# Patient Record
Sex: Female | Born: 1970 | Race: White | Hispanic: Yes | Marital: Married | Smoking: Never smoker
Health system: Southern US, Community
[De-identification: ages and names within clinical notes are randomized; demographics above are authoritative.]

## PROBLEM LIST (undated history)

## (undated) DIAGNOSIS — Z8742 Personal history of other diseases of the female genital tract: Secondary | ICD-10-CM

## (undated) DIAGNOSIS — N840 Polyp of corpus uteri: Secondary | ICD-10-CM

## (undated) DIAGNOSIS — Z8679 Personal history of other diseases of the circulatory system: Secondary | ICD-10-CM

## (undated) DIAGNOSIS — Z9889 Other specified postprocedural states: Secondary | ICD-10-CM

## (undated) DIAGNOSIS — N809 Endometriosis, unspecified: Secondary | ICD-10-CM

## (undated) DIAGNOSIS — Z8673 Personal history of transient ischemic attack (TIA), and cerebral infarction without residual deficits: Secondary | ICD-10-CM

## (undated) DIAGNOSIS — D6859 Other primary thrombophilia: Secondary | ICD-10-CM

## (undated) DIAGNOSIS — R102 Pelvic and perineal pain: Principal | ICD-10-CM

## (undated) DIAGNOSIS — R51 Headache: Secondary | ICD-10-CM

## (undated) HISTORY — PX: OTHER SURGICAL HISTORY: SHX169

## (undated) HISTORY — DX: Other primary thrombophilia: D68.59

## (undated) HISTORY — DX: Headache: R51

---

## 1999-10-06 HISTORY — PX: LAPAROSCOPIC OVARIAN CYSTECTOMY: SUR786

## 2004-08-25 ENCOUNTER — Emergency Department (HOSPITAL_COMMUNITY): Admission: EM | Admit: 2004-08-25 | Discharge: 2004-08-25 | Payer: Self-pay | Admitting: Emergency Medicine

## 2004-10-14 ENCOUNTER — Encounter (INDEPENDENT_AMBULATORY_CARE_PROVIDER_SITE_OTHER): Payer: Self-pay | Admitting: Specialist

## 2004-10-14 ENCOUNTER — Ambulatory Visit: Payer: Self-pay | Admitting: Obstetrics & Gynecology

## 2004-11-05 ENCOUNTER — Ambulatory Visit (HOSPITAL_COMMUNITY): Admission: RE | Admit: 2004-11-05 | Discharge: 2004-11-05 | Payer: Self-pay | Admitting: Obstetrics and Gynecology

## 2004-11-28 ENCOUNTER — Ambulatory Visit: Payer: Self-pay | Admitting: Internal Medicine

## 2004-12-25 ENCOUNTER — Encounter (INDEPENDENT_AMBULATORY_CARE_PROVIDER_SITE_OTHER): Payer: Self-pay | Admitting: *Deleted

## 2004-12-25 ENCOUNTER — Other Ambulatory Visit: Admission: RE | Admit: 2004-12-25 | Discharge: 2004-12-25 | Payer: Self-pay | Admitting: Obstetrics and Gynecology

## 2005-01-08 ENCOUNTER — Ambulatory Visit: Payer: Self-pay | Admitting: Family Medicine

## 2005-07-20 ENCOUNTER — Ambulatory Visit: Payer: Self-pay | Admitting: Endocrinology

## 2005-07-30 ENCOUNTER — Ambulatory Visit: Payer: Self-pay | Admitting: Internal Medicine

## 2005-07-30 ENCOUNTER — Ambulatory Visit: Payer: Self-pay | Admitting: Obstetrics and Gynecology

## 2005-07-30 ENCOUNTER — Encounter: Payer: Self-pay | Admitting: Obstetrics and Gynecology

## 2005-08-14 ENCOUNTER — Ambulatory Visit: Payer: Self-pay

## 2005-09-01 ENCOUNTER — Ambulatory Visit: Payer: Self-pay | Admitting: Internal Medicine

## 2005-09-02 ENCOUNTER — Ambulatory Visit: Payer: Self-pay | Admitting: Internal Medicine

## 2006-03-07 ENCOUNTER — Inpatient Hospital Stay (HOSPITAL_COMMUNITY): Admission: AD | Admit: 2006-03-07 | Discharge: 2006-03-07 | Payer: Self-pay | Admitting: Obstetrics and Gynecology

## 2006-07-05 ENCOUNTER — Inpatient Hospital Stay (HOSPITAL_COMMUNITY): Admission: AD | Admit: 2006-07-05 | Discharge: 2006-07-05 | Payer: Self-pay | Admitting: Obstetrics and Gynecology

## 2006-07-07 ENCOUNTER — Inpatient Hospital Stay (HOSPITAL_COMMUNITY): Admission: AD | Admit: 2006-07-07 | Discharge: 2006-07-09 | Payer: Self-pay | Admitting: Obstetrics and Gynecology

## 2006-09-15 ENCOUNTER — Ambulatory Visit: Payer: Self-pay | Admitting: Internal Medicine

## 2006-09-15 LAB — CONVERTED CEMR LAB
AST: 19 units/L (ref 0–37)
Albumin: 4.2 g/dL (ref 3.5–5.2)
Alkaline Phosphatase: 68 units/L (ref 39–117)
Bacteria, U Microscopic: NEGATIVE /hpf
Basophils Absolute: 0 10*3/uL (ref 0.0–0.1)
CO2: 30 meq/L (ref 19–32)
Chol/HDL Ratio, serum: 3
Cholesterol: 176 mg/dL (ref 0–200)
Creatinine, Ser: 0.7 mg/dL (ref 0.4–1.2)
Crystals: NEGATIVE
GFR calc non Af Amer: 102 mL/min
HCT: 40.6 % (ref 36.0–46.0)
MCHC: 32.6 g/dL (ref 30.0–36.0)
Monocytes Relative: 8 % (ref 3.0–11.0)
Mucus, UA: NEGATIVE
Neutro Abs: 2.1 10*3/uL (ref 1.4–7.7)
Potassium: 4.3 meq/L (ref 3.5–5.1)
RBC: 5.01 M/uL (ref 3.87–5.11)
RDW: 14 % (ref 11.5–14.6)
Sodium: 142 meq/L (ref 135–145)
Specific Gravity, Urine: 1.02 (ref 1.000–1.03)
Total Bilirubin: 0.7 mg/dL (ref 0.3–1.2)
Total Protein: 7.3 g/dL (ref 6.0–8.3)
Urine Glucose: NEGATIVE mg/dL
Urobilinogen, UA: 0.2 (ref 0.0–1.0)

## 2006-09-22 ENCOUNTER — Ambulatory Visit: Payer: Self-pay | Admitting: Internal Medicine

## 2006-10-18 ENCOUNTER — Encounter: Admission: RE | Admit: 2006-10-18 | Discharge: 2006-10-18 | Payer: Self-pay | Admitting: Internal Medicine

## 2006-10-18 ENCOUNTER — Ambulatory Visit: Payer: Self-pay | Admitting: Internal Medicine

## 2006-10-18 LAB — CONVERTED CEMR LAB
Vit D, 1,25-Dihydroxy: 38 (ref 20–57)
Vitamin B-12: 413 pg/mL (ref 211–911)

## 2006-11-16 ENCOUNTER — Ambulatory Visit (HOSPITAL_COMMUNITY): Admission: RE | Admit: 2006-11-16 | Discharge: 2006-11-17 | Payer: Self-pay | Admitting: Interventional Radiology

## 2006-11-24 ENCOUNTER — Ambulatory Visit: Payer: Self-pay | Admitting: Internal Medicine

## 2006-11-24 LAB — CONVERTED CEMR LAB
Basophils Relative: 1.1 % — ABNORMAL HIGH (ref 0.0–1.0)
Eosinophils Relative: 1.9 % (ref 0.0–5.0)
HCT: 34.6 % — ABNORMAL LOW (ref 36.0–46.0)
Hemoglobin: 11.4 g/dL — ABNORMAL LOW (ref 12.0–15.0)
Lymphocytes Relative: 39.5 % (ref 12.0–46.0)
Monocytes Absolute: 0.3 10*3/uL (ref 0.2–0.7)
Neutro Abs: 2.4 10*3/uL (ref 1.4–7.7)
Neutrophils Relative %: 50.8 % (ref 43.0–77.0)
Platelets: 288 10*3/uL (ref 150–400)
Transferrin: 272.4 mg/dL (ref >=212.0)
WBC: 4.8 10*3/uL (ref 4.5–10.5)

## 2006-11-29 ENCOUNTER — Ambulatory Visit: Payer: Self-pay | Admitting: Internal Medicine

## 2006-11-30 ENCOUNTER — Encounter: Payer: Self-pay | Admitting: Interventional Radiology

## 2007-01-09 ENCOUNTER — Ambulatory Visit (HOSPITAL_COMMUNITY): Admission: RE | Admit: 2007-01-09 | Discharge: 2007-01-09 | Payer: Self-pay | Admitting: Interventional Radiology

## 2007-02-24 ENCOUNTER — Ambulatory Visit: Payer: Self-pay | Admitting: Internal Medicine

## 2007-02-25 ENCOUNTER — Ambulatory Visit (HOSPITAL_COMMUNITY): Admission: RE | Admit: 2007-02-25 | Discharge: 2007-02-25 | Payer: Self-pay | Admitting: Interventional Radiology

## 2007-06-03 ENCOUNTER — Encounter: Payer: Self-pay | Admitting: Internal Medicine

## 2007-06-03 DIAGNOSIS — D689 Coagulation defect, unspecified: Secondary | ICD-10-CM

## 2007-06-03 DIAGNOSIS — I671 Cerebral aneurysm, nonruptured: Secondary | ICD-10-CM | POA: Insufficient documentation

## 2007-07-17 ENCOUNTER — Encounter: Admission: RE | Admit: 2007-07-17 | Discharge: 2007-07-17 | Payer: Self-pay | Admitting: Neurology

## 2007-08-15 ENCOUNTER — Encounter: Payer: Self-pay | Admitting: Internal Medicine

## 2007-08-21 ENCOUNTER — Encounter: Admission: RE | Admit: 2007-08-21 | Discharge: 2007-08-21 | Payer: Self-pay | Admitting: Neurology

## 2007-09-02 ENCOUNTER — Encounter: Admission: RE | Admit: 2007-09-02 | Discharge: 2007-09-02 | Payer: Self-pay | Admitting: Neurology

## 2007-10-10 ENCOUNTER — Encounter: Admission: RE | Admit: 2007-10-10 | Discharge: 2007-10-10 | Payer: Self-pay | Admitting: Neurology

## 2007-11-25 ENCOUNTER — Encounter: Admission: RE | Admit: 2007-11-25 | Discharge: 2007-11-25 | Payer: Self-pay | Admitting: Obstetrics and Gynecology

## 2008-01-02 ENCOUNTER — Ambulatory Visit: Payer: Self-pay | Admitting: Internal Medicine

## 2008-02-24 ENCOUNTER — Ambulatory Visit (HOSPITAL_COMMUNITY): Admission: RE | Admit: 2008-02-24 | Discharge: 2008-02-24 | Payer: Self-pay | Admitting: Interventional Radiology

## 2009-01-02 ENCOUNTER — Encounter: Admission: RE | Admit: 2009-01-02 | Discharge: 2009-01-02 | Payer: Self-pay | Admitting: Obstetrics and Gynecology

## 2009-03-08 ENCOUNTER — Ambulatory Visit: Payer: Self-pay | Admitting: Internal Medicine

## 2009-03-08 DIAGNOSIS — M545 Low back pain: Secondary | ICD-10-CM

## 2009-03-08 LAB — CONVERTED CEMR LAB
Specific Gravity, Urine: 1.01 (ref 1.000–1.030)
Urine Glucose: NEGATIVE mg/dL
Urobilinogen, UA: 0.2 (ref 0.0–1.0)

## 2009-03-18 ENCOUNTER — Telehealth: Payer: Self-pay | Admitting: Internal Medicine

## 2009-03-19 ENCOUNTER — Encounter: Payer: Self-pay | Admitting: Internal Medicine

## 2009-05-27 ENCOUNTER — Ambulatory Visit: Payer: Self-pay | Admitting: Internal Medicine

## 2009-05-27 LAB — CONVERTED CEMR LAB
Basophils Absolute: 0 10*3/uL (ref 0.0–0.1)
Bilirubin, Direct: 0 mg/dL (ref 0.0–0.3)
Cholesterol: 145 mg/dL (ref 0–200)
Eosinophils Absolute: 0.2 10*3/uL (ref 0.0–0.7)
GFR calc non Af Amer: 119.7 mL/min (ref >60)
Glucose, Bld: 91 mg/dL (ref 70–99)
HDL: 44.2 mg/dL (ref >39.00)
MCHC: 33.8 g/dL (ref 30.0–36.0)
MCV: 83 fL (ref 78.0–100.0)
Monocytes Absolute: 0.3 10*3/uL (ref 0.1–1.0)
Neutrophils Relative %: 42.8 % — ABNORMAL LOW (ref 43.0–77.0)
Nitrite: NEGATIVE
Platelets: 190 10*3/uL (ref 150.0–400.0)
Potassium: 4 meq/L (ref 3.5–5.1)
Sodium: 144 meq/L (ref 135–145)
Total Bilirubin: 0.6 mg/dL (ref 0.3–1.2)
Total Protein, Urine: NEGATIVE mg/dL
VLDL: 13.4 mg/dL (ref 0.0–40.0)
WBC: 3.4 10*3/uL — ABNORMAL LOW (ref 4.5–10.5)
pH: 5.5 (ref 5.0–8.0)

## 2009-05-30 LAB — CONVERTED CEMR LAB: Vit D, 25-Hydroxy: 25 ng/mL — ABNORMAL LOW (ref 30–89)

## 2009-06-03 ENCOUNTER — Ambulatory Visit: Payer: Self-pay | Admitting: Internal Medicine

## 2009-06-03 DIAGNOSIS — R5383 Other fatigue: Secondary | ICD-10-CM | POA: Insufficient documentation

## 2009-06-12 ENCOUNTER — Ambulatory Visit: Payer: Self-pay | Admitting: Internal Medicine

## 2009-06-14 ENCOUNTER — Telehealth: Payer: Self-pay | Admitting: Internal Medicine

## 2009-10-14 ENCOUNTER — Ambulatory Visit: Payer: Self-pay | Admitting: Internal Medicine

## 2009-10-25 ENCOUNTER — Ambulatory Visit (HOSPITAL_COMMUNITY): Admission: RE | Admit: 2009-10-25 | Discharge: 2009-10-25 | Payer: Self-pay | Admitting: Interventional Radiology

## 2009-12-03 ENCOUNTER — Encounter: Payer: Self-pay | Admitting: Internal Medicine

## 2010-04-02 ENCOUNTER — Ambulatory Visit: Payer: Self-pay | Admitting: Internal Medicine

## 2010-04-02 DIAGNOSIS — G243 Spasmodic torticollis: Secondary | ICD-10-CM | POA: Insufficient documentation

## 2010-04-02 DIAGNOSIS — M542 Cervicalgia: Secondary | ICD-10-CM | POA: Insufficient documentation

## 2010-04-09 ENCOUNTER — Telehealth: Payer: Self-pay | Admitting: Internal Medicine

## 2010-05-26 ENCOUNTER — Ambulatory Visit: Payer: Self-pay | Admitting: Internal Medicine

## 2010-05-26 LAB — CONVERTED CEMR LAB
ALT: 12 units/L (ref 0–35)
AST: 16 units/L (ref 0–37)
Alkaline Phosphatase: 41 units/L (ref 39–117)
Basophils Relative: 1.3 % (ref 0.0–3.0)
Bilirubin, Direct: 0 mg/dL (ref 0.0–0.3)
Chloride: 108 meq/L (ref 96–112)
Cholesterol: 149 mg/dL (ref 0–200)
Creatinine, Ser: 0.7 mg/dL (ref 0.4–1.2)
Eosinophils Relative: 3.1 % (ref 0.0–5.0)
GFR calc non Af Amer: 108.54 mL/min (ref >60)
LDL Cholesterol: 92 mg/dL (ref 0–99)
Leukocytes, UA: NEGATIVE
MCV: 81.9 fL (ref 78.0–100.0)
Monocytes Absolute: 0.3 10*3/uL (ref 0.1–1.0)
Monocytes Relative: 8.9 % (ref 3.0–12.0)
Neutrophils Relative %: 45 % (ref 43.0–77.0)
Nitrite: NEGATIVE
Potassium: 4.2 meq/L (ref 3.5–5.1)
RBC: 4.21 M/uL (ref 3.87–5.11)
Specific Gravity, Urine: >=1.03 (ref 1.000–1.030)
Total CHOL/HDL Ratio: 3
Total Protein: 6.5 g/dL (ref 6.0–8.3)
Urobilinogen, UA: 0.2 (ref 0.0–1.0)
VLDL: 7.8 mg/dL (ref 0.0–40.0)
WBC: 3.8 10*3/uL — ABNORMAL LOW (ref 4.5–10.5)

## 2010-06-04 ENCOUNTER — Ambulatory Visit: Payer: Self-pay | Admitting: Internal Medicine

## 2010-06-04 DIAGNOSIS — R51 Headache: Secondary | ICD-10-CM

## 2010-07-03 ENCOUNTER — Ambulatory Visit: Payer: Self-pay | Admitting: Internal Medicine

## 2010-07-03 DIAGNOSIS — J189 Pneumonia, unspecified organism: Secondary | ICD-10-CM | POA: Insufficient documentation

## 2010-08-06 ENCOUNTER — Ambulatory Visit: Payer: Self-pay | Admitting: Internal Medicine

## 2010-08-06 DIAGNOSIS — H1030 Unspecified acute conjunctivitis, unspecified eye: Secondary | ICD-10-CM

## 2010-10-25 ENCOUNTER — Encounter: Payer: Self-pay | Admitting: *Deleted

## 2010-10-26 ENCOUNTER — Encounter: Payer: Self-pay | Admitting: Obstetrics and Gynecology

## 2010-11-04 NOTE — Assessment & Plan Note (Signed)
Summary: cough,cold getting worse/cd   Vital Signs:  Patient profile:   40 year old female Menstrual status:  regular Height:      63 inches Weight:      130 pounds Temp:     99.4 degrees F oral Pulse rate:   88 / minute Pulse rhythm:   regular Resp:     16 per minute BP sitting:   104 / 60  (left arm) Cuff size:   regular  Vitals Entered By: Lanier Prude, CMA(AAMA) (July 03, 2010 8:09 AM) CC: cough, chest congestion X 1wk Is Patient Diabetic? No   Primary Care Tuyet Bader:  Georgina Quint Plotnikov MD  CC:  cough and chest congestion X 1wk.  History of Present Illness: The patient presents with complaints of sore throat, fever, cough, sinus congestion and drainge of seven days duration. Not better with OTC meds. Chest hurts with coughing. Can't sleep due to cough. Muscle aches are present.  The mucus is colored.   Current Medications (verified): 1)  Aspirin Ec 325 Mg  Tbec (Aspirin) .... Once Daily 2)  Vitamin D3 1000 Unit  Tabs (Cholecalciferol) .... 2 By Mouth Daily 3)  Fioricet 50-325-40 Mg Tabs (Butalbital-Apap-Caffeine) .[name].. 1-2 By Mouth Two Times A Day As Needed Migraine or Tension Ha 4)  Pennsaid 1.5 % Soln (Diclofenac Sodium) .... 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    Physical Exam  General:  Well-developed, well-nourished, in no acute distress; alert and oriented x 3.   Mouth:   Erythematous throat and intranasal mucosa c/w URI  Lungs:  R rhonchi Heart:  RRR Abdomen:  soft, non-tender, normal bowel sounds, no distention, no masses, no guarding, no rigidity, no rebound tenderness, no hepatomegaly, and no splenomegaly.   Msk:  normal ROM, no joint tenderness, no joint swelling, no joint warmth, no redness over joints, no joint deformities, no joint instability, no crepitation, and no muscle atrophy.   Skin:  Intact without suspicious lesions or rashes Psych:  Cognition and judgment appear intact. Alert and cooperative with normal attention span and concentration. No apparent delusions, illusions, hallucinations   Impression & Recommendations:  Problem # 1:  PNEUMONIA (ICD-486) R vs bronchitis Assessment New See "Patient Instructions".  Rx provided - see meds Orders: T-2 View CXR, Same Day (71020.5TC)  Complete Medication List: 1)  Aspirin Ec 325 Mg Tbec (Aspirin) .... Once daily 2)  Vitamin D3 1000 Unit Tabs (Cholecalciferol) .... 2 by mouth daily 3)  Fioricet 50-325-40 Mg Tabs (Butalbital-apap-caffeine) .[name].. 1-2 by mouth two times a day as needed migraine or tension ha 4)  Pennsaid 1.5 % Soln (Diclofenac sodium) .... 3-5 gtt on skin three times a day for pain 5)  Avelox 400 Mg Tabs (Moxifloxacin hcl) .[name].. 1 by mouth once daily x 10 d 6)  Promethazine-codeine 6.25-10 Mg/61ml Syrp (Promethazine-codeine) .... 5-10 ml by mouth q id as needed cough 7)  Tessalon Perles 100 Mg Caps (Benzonatate) .[name].. 1-2 by mouth two times a day as needed cogh  Patient Instructions: 1)  Use over-the-counter medicines for "cold": Tylenol  650mg  or Advil 400mg  every 6 hours  for fever; Delsym or Robutussin for cough. Mucinex or Mucinex D for congestion. Ricola or Halls for sore throat. Office visit if not better or if worse.  Prescriptions: AVELOX 400 MG TABS (MOXIFLOXACIN HCL) 1 by mouth once daily x 10 d  #10 x 0   Entered and Authorized by:   Tresa Garter MD   Signed by:    Tresa Garter MD on 07/03/2010   Method used:   Print then Give to Patient   RxID:   [id number] TESSALON PERLES 100 MG CAPS (BENZONATATE) 1-2 by mouth two times a day as needed cogh  #120 x 1   Entered and Authorized by:   Tresa Garter MD   Signed by:   Tresa Garter MD on 07/03/2010   Method used:   Print then Give to Patient   RxID:   [id number] PROMETHAZINE-CODEINE 6.25-10 MG/5ML SYRP (PROMETHAZINE-CODEINE) 5-10 ml by mouth q id as needed cough  #300 ml x 0   Entered and Authorized by:   Tresa Garter MD   Signed by:   Tresa Garter MD on 07/03/2010   Method used:   Print then Give to Patient   RxID:   [id number]

## 2010-11-04 NOTE — Assessment & Plan Note (Signed)
Summary: R arm pain/numbess x last night   Vital Signs:  Patient profile:   40 year old female Height:      63 inches (160.02 cm) Weight:      140.0 pounds (63.64 kg) O2 Sat:      98 % on Room air Temp:     98.1 degrees F (36.72 degrees C) oral Pulse rate:   66 / minute BP sitting:   104 / 72  (left arm)  Vitals Entered By: Orlan Leavens (October 14, 2009 1:09 PM)  O2 Flow:  Room air CC: (R) arm pain & numbness Is Patient Diabetic? No Pain Assessment Patient in pain? yes     Location: (R) arm Type: numbness   Primary Care Provider:  Tresa Garter MD  CC:  (R) arm pain & numbness.  History of Present Illness: c/o right arm  -feeling of numbness "like a glove that is too tight" onset 2 days ago (yest AM after awaking from sleep) sudden onset w/o change in nature of symptoms since numbness began - no better, no worse denies pain in arm, neck or shoulder numbness involves the "whole arm", down ring finger too no injury or overuse in recent days not a/w swelling, redness  Current Medications (verified): 1)  Aspirin Ec 325 Mg  Tbec (Aspirin) .... Once Daily 2)  Cyclobenzaprine Hcl 10 Mg  Tabs (Cyclobenzaprine Hcl) .... 1/2 or 1  By Mouth 2 Times Daily As Needed For Back Pain 3)  Tramadol Hcl 50 Mg  Tabs (Tramadol Hcl) .[name].. 1po Two Times A Day As Needed Pain 4)  Vitamin D3 1000 Unit  Tabs (Cholecalciferol) .... 2 By Mouth Daily  Allergies (verified): No Known Drug Allergies  Past History:  Past Medical History: Last updated: 06/03/2009 Cerebrovascular accident, hx of  - thalamic - found on imaging 2003 - on ASA only for now recurrent headache  - Chiari malformation - no 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 anti-thrombin III deficiency hx of migraine  Review of Systems  The patient denies anorexia, fever, chest pain, headaches, muscle weakness, and difficulty walking.    Physical Exam  General:  alert, well-developed, well-nourished, and cooperative to examination.    Neck:   supple, full ROM, no masses, no thyromegaly; no thyroid nodules or tenderness. no JVD or carotid bruits.   Lungs:  normal respiratory effort, no intercostal retractions or use of accessory muscles; normal breath sounds bilaterally - no crackles and no wheezes.    Heart:  normal rate, regular rhythm, no murmur, and no rub. BLE without edema.  Msk:  right shoulder: Full range of motion. full strength with testing rotator cuff. Negative impingement signs. Nontender to palpation. Neurovascularly intact  Neurologic:  alert & oriented X3 and cranial nerves II-XII symetrically intact.  strength normal in all extremities, sensation intact to light touch, and gait normal. speech fluent without dysarthria or aphasia; follows commands with good comprehension. equal strong grip bilaterally   Impression & Recommendations:  Problem # 1:  NECK PAIN (ICD-723.1)  a/w RUE numbness but no motor deficits or decrease in grip as compared to left - symptoms present <48h at this time check plain film -r/o DDD rx with pred pack and robaxin - if persisting symptoms, to see PCP for consideration of other imaging as needed  Her updated medication list for this problem includes:    Aspirin Ec 325 Mg Tbec (Aspirin) ..... Once daily    Tramadol Hcl 50 Mg Tabs (Tramadol hcl) .[name]... 1po two times a day  as needed pain    Robaxin 500 Mg Tabs (Methocarbamol) .[name]... 1 by mouth three times a day as needed for muscle spasm and pain  Orders: T-Cervicle Spine 2-3 Views [phone]) Prescription Created Electronically [phone])  Complete Medication List: 1)  Aspirin Ec 325 Mg Tbec (Aspirin) .... Once daily 2)  Tramadol Hcl 50 Mg Tabs (Tramadol hcl) .[name].. 1po two times a day as needed pain 3)  Vitamin D3 1000 Unit Tabs (Cholecalciferol) .... 2 by mouth daily 4)  Robaxin 500 Mg Tabs (Methocarbamol) .[name].. 1 by mouth three times a day as needed for muscle spasm and pain 5)  Prednisone (pak) 10 Mg Tabs (Prednisone) .... As directed x 6  days  Patient Instructions: 1)  it was good to see you today.  2)  test(s) ordered today (xray of your neck) - your results will be posted on the phone tree for review in 48-72 hours from the time of test completion; call [phone] and enter your 9 digit MRN (listed above on this page, just below your name); if any changes need to be made or there are abnormal results, you will be contacted directly.  3)  treat the symptoms with prednisone pack and robaxin for muscle relaxant - your prescriptions have been electronically submitted to your pharmacy. Please take as directed. Contact our office if you believe you're having problems with the medication(s).  4)  If continued numbness or any worsening symptoms like pain, you should call for reevaluation of symptoms for further testing and treatment Prescriptions: PREDNISONE (PAK) 10 MG TABS (PREDNISONE) as directed x 6 days  #1 x 0   Entered and Authorized by:   Newt Lukes MD   Signed by:   Newt Lukes MD on 10/14/2009   Method used:   Electronically to        [facility] [id number]* (retail)       [address] [place], [place]  [address]       Ph: [phone]       Fax: [phone]   RxID:   [id number] ROBAXIN 500 MG TABS (METHOCARBAMOL) 1 by mouth three times a day as needed for muscle spasm and pain  #30 x 1   Entered and Authorized by:   Newt Lukes MD   Signed by:   Newt Lukes MD on 10/14/2009   Method used:   Electronically to        [facility] #[id number]* (retail)       [address] [place], [place]  [address]       Ph: [phone]       Fax: [phone]   RxID:   [id number]

## 2010-11-04 NOTE — Progress Notes (Signed)
Summary: results  Phone Note Call from Patient Call back at Home Phone [phone] Call back at [phone]   Summary of Call: Patient calling for XRay results. Please advise. Initial call taken by: Lucious Groves,  April 09, 2010 1:54 PM  Follow-up for Phone Call        OV with TJ reviewed and c-spine xrays: mild arthritis - no change from 10/2009 xrays -  if cont pain symptoms, should make OV with PCP (AP) to review further tx plans Follow-up by: Newt Lukes MD,  April 09, 2010 2:06 PM  Additional Follow-up for Phone Call Additional follow up Details #1::        Patient notiified and states that she still feels something there and she is choosing not to take the prescription given to her. Patient was advised that if anything changes to schedule office visit. Additional Follow-up by: Lucious Groves,  April 09, 2010 4:30 PM

## 2010-11-04 NOTE — Letter (Signed)
Summary: [facility]  [facility]   Imported By: Sherian Rein 12/25/2009 07:25:54  _____________________________________________________________________  External Attachment:    Type:   Image     Comment:   External Document

## 2010-11-04 NOTE — Assessment & Plan Note (Signed)
Summary: EYE PINK---STC   Vital Signs:  Patient profile:   40 year old female Menstrual status:  regular Height:      63 inches Weight:      130 pounds BMI:     23.11 O2 Sat:      98 % on Room air Temp:     98.6 degrees F oral Pulse rate:   67 / minute BP sitting:   100 / 60  (left arm) Cuff size:   regular  Vitals Entered By: Alysia Penna (August 06, 2010 4:20 PM)  O2 Flow:  Room air CC: pt c/o pink left eye. /cp sma   Primary Care Provider:  Georgina Quint Plotnikov MD  CC:  pt c/o pink left eye. /cp sma.  History of Present Illness: C/o pink eye L x [age] d 40 year old has pink eye too  Current Medications (verified): 1)  Aspirin Ec 325 Mg  Tbec (Aspirin) .... Once Daily 2)  Vitamin D3 1000 Unit  Tabs (Cholecalciferol) .... 2 By Mouth Daily 3)  Fioricet 50-325-40 Mg Tabs (Butalbital-Apap-Caffeine) .[name].. 1-2 By Mouth Two Times A Day As Needed Migraine or Tension Ha 4)  Pennsaid 1.5 % Soln (Diclofenac Sodium) .... 3-5 Gtt On Skin Three Times A Day For Pain 5)  Promethazine-Codeine 6.25-10 Mg/87ml Syrp (Promethazine-Codeine) .... 5-10 Ml By Mouth Q Id As Needed Cough 6)  Tessalon Perles 100 Mg Caps (Benzonatate) .[name].. 1-2 By Mouth Two Times A Day As Needed Cogh  Allergies (verified): No Known Drug Allergies  Physical Exam  General:  Well-developed, well-nourished, in no acute distress; alert and oriented x 3.   Eyes:  L eye w/conjunct injectionpupils equal, pupils round, and pupils reactive to light.  NT, soft Heart:  RRR Skin:  Intact without suspicious lesions or rashes Psych:  Oriented X3.     Impression & Recommendations:  Problem # 1:  CONJUNCTIVITIS, ACUTE (ICD-372.00) L Assessment New Sulf-Pred eye gtt Loratidine  Complete Medication List: 1)  Aspirin Ec 325 Mg Tbec (Aspirin) .... Once daily 2)  Vitamin D3 1000 Unit Tabs (Cholecalciferol) .... 2 by mouth daily 3)  Fioricet 50-325-40 Mg Tabs (Butalbital-apap-caffeine) .[name].. 1-2 by mouth two times a day as  needed migraine or tension ha 4)  Pennsaid 1.5 % Soln (Diclofenac sodium) .... 3-5 gtt on skin three times a day for pain 5)  Promethazine-codeine 6.25-10 Mg/23ml Syrp (Promethazine-codeine) .... 5-10 ml by mouth q id as needed cough 6)  Tessalon Perles 100 Mg Caps (Benzonatate) .[name].. 1-2 by mouth two times a day as needed cogh 7)  Sulfacetamide-prednisolone 10-0.2 % Susp (Sulfacetamide-prednisolone) .... 2 gtt qid l eye 8)  Loratadine 10 Mg Tabs (Loratadine) .[name].. 1 by mouth once daily as needed allergies  Patient Instructions: 1)  Call if you are not better in a reasonable amount of time or if worse.  Prescriptions: LORATADINE 10 MG TABS (LORATADINE) 1 by mouth once daily as needed allergies  #30 x 0   Entered and Authorized by:   Tresa Garter MD   Signed by:   Tresa Garter MD on 08/06/2010   Method used:   Print then Give to Patient   RxID:   [id number] SULFACETAMIDE-PREDNISOLONE 10-0.2 % SUSP (SULFACETAMIDE-PREDNISOLONE) 2 gtt qid L eye  #1 x 0   Entered and Authorized by:   Tresa Garter MD   Signed by:   Tresa Garter MD on 08/06/2010   Method used:   Print then Give to Patient  RxID:   [id number] SULFACETAMIDE-PREDNISOLONE 10-0.2 % SUSP (SULFACETAMIDE-PREDNISOLONE) 2 gtt qid L eye  #1 x 0   Entered and Authorized by:   Tresa Garter MD   Signed by:   Tresa Garter MD on 08/06/2010   Method used:   Electronically to        [id number]* (retail)       [address] [place], [place]  [address]       Ph: [phone]       Fax: [phone]   RxID:   [id number]    Orders Added: 1)  Est. Patient Level III [30160]

## 2010-11-04 NOTE — Assessment & Plan Note (Signed)
Summary: CPX /NWS  #   Vital Signs:  Patient profile:   40 year old female Menstrual status:  regular Height:      63 inches Weight:      131 pounds BMI:     23.29 O2 Sat:      98 % on Room air Temp:     98.0 degrees F oral Pulse rate:   67 / minute Pulse rhythm:   regular Resp:     16 per minute BP sitting:   100 / 70  (left arm) Cuff size:   regular  Vitals Entered By: Lanier Prude, CMA(AAMA) (June 04, 2010 8:29 AM)  O2 Flow:  Room air CC: CPX Is Patient Diabetic? No   Primary Care Elenor Wildes:  Tresa Garter MD  CC:  CPX.  History of Present Illness: The patient presents for a preventive health examination  C/o neck pain x 2 months L  Current Medications (verified): 1)  Aspirin Ec 325 Mg  Tbec (Aspirin) .... Once Daily 2)  Tramadol Hcl 50 Mg  Tabs (Tramadol Hcl) .[name].. 1po Two Times A Day As Needed Pain 3)  Vitamin D3 1000 Unit  Tabs (Cholecalciferol) .... 2 By Mouth Daily 4)  Robaxin 500 Mg Tabs (Methocarbamol) .[name].. 1 By Mouth Three Times A Day As Needed For Muscle Spasm and Pain 5)  Naprosyn 375 Mg Tabs (Naproxen) .... 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 hx of migraine  Past Surgical History: Last updated: 01/02/2008 s/p carotid anueurysm s/p coil tx - currently off plavix  s/p ovary cyst - 2001 hx of laparoscopy 2001  Family History: Last updated: 01/02/2008 heart disease and stroke - grandmother mother with leukemia father with lung cancer/smoking Family History of Alcoholism/Addiction breast cancer  Social History: Last updated: 06/03/2009 Married 2 children work - Diplomatic Services operational officer at [employer] Never Smoked Alcohol use-no emigre to  [place] 1999 from [place]  Review of Systems       The patient complains of headaches.  The patient denies anorexia, fever, weight loss, weight gain, vision loss, decreased hearing, hoarseness, chest pain, syncope, dyspnea on exertion, peripheral edema, prolonged cough, hemoptysis, abdominal pain, melena, hematochezia, severe indigestion/heartburn, hematuria, incontinence, genital sores, muscle weakness, suspicious skin lesions, transient blindness, difficulty walking, depression, unusual weight change, abnormal bleeding, enlarged lymph nodes, angioedema, and breast masses.         neck pain  Physical Exam  General:  Well-developed, well-nourished, in no acute distress; alert and oriented x 3.   Head:  Normocephalic and atraumatic without obvious abnormalities. No apparent alopecia or balding. Eyes:  No corneal or conjunctival inflammation noted. EOMI. Perrla. Funduscopic exam benign, without hemorrhages, exudates or papilledema. Vision grossly normal. Ears:  External ear exam shows no significant lesions or deformities.  Otoscopic examination reveals clear canals, tympanic membranes are intact bilaterally without bulging, retraction, inflammation or discharge. Hearing is grossly normal bilaterally. Nose:  External nasal examination shows no deformity or inflammation. Nasal mucosa are pink and moist without lesions or exudates. Mouth:  Oral mucosa and oropharynx without lesions or exudates.  Teeth in good repair. Neck:  L trap is tender Lungs:  normal respiratory effort, no intercostal retractions, no accessory  muscle use, normal breath sounds, no dullness, no fremitus, no crackles, and no wheezes.   Heart:  normal rate, regular rhythm, no murmur, no gallop, no rub, and no JVD.   Abdomen:  soft, non-tender, normal bowel sounds, no distention, no masses, no guarding, no rigidity, no rebound tenderness, no hepatomegaly, and no splenomegaly.   Msk:  normal ROM, no joint tenderness, no joint swelling, no  joint warmth, no redness over joints, no joint deformities, no joint instability, no crepitation, and no muscle atrophy.   Extremities:  No clubbing, cyanosis, edema, or deformity noted with normal full range of motion of all joints.   Neurologic:  No cranial nerve deficits noted. Station and gait are normal. Plantar reflexes are down-going bilaterally. DTRs are symmetrical throughout. Sensory, motor and coordinative functions appear intact. Skin:  Intact without suspicious lesions or rashes Cervical Nodes:  No lymphadenopathy noted Inguinal Nodes:  No significant adenopathy Psych:  Cognition and judgment appear intact. Alert and cooperative with normal attention span and concentration. No apparent delusions, illusions, hallucinations   Impression & Recommendations:  Problem # 1:  PHYSICAL EXAMINATION (ICD-V70.0) Assessment New Health and age related issues were discussed. Available screening tests and vaccinations were discussed as well. Healthy life style including good diet and exercise was discussed.  The labs were reviewed with the patient.   Problem # 2:  NECK PAIN, LEFT (ICD-723.1) MSK Assessment: Unchanged See "Patient Instructions".  The following medications were removed from the medication list:    Tramadol Hcl 50 Mg Tabs (Tramadol hcl) .[name]... 1po two times a day as needed pain    Robaxin 500 Mg Tabs (Methocarbamol) .[name]... 1 by mouth three times a day as needed for muscle spasm and pain    Naprosyn 375 Mg Tabs (Naproxen) ..... One by mouth two times a day as needed for neck pain Her updated medication list for this problem includes:    Aspirin Ec 325 Mg Tbec (Aspirin) ..... Once daily    Fioricet 50-325-40 Mg Tabs (Butalbital-apap-caffeine) .[name]... 1-2 by mouth two times a day as needed migraine or tension ha  Problem # 3:  CEREBROVASCULAR ACCIDENT, HX OF (ICD-V12.50) Assessment: Comment Only  Problem # 4:  HEADACHE (ICD-784.0) Assessment: Unchanged S/p NS consult The  following medications were removed from the medication list:    Tramadol Hcl 50 Mg Tabs (Tramadol hcl) .[name]... 1po two times a day as needed pain    Naprosyn 375 Mg Tabs (Naproxen) ..... One by mouth two times a day as needed for neck pain Her updated medication list for this problem includes:    Aspirin Ec 325 Mg Tbec (Aspirin) ..... Once daily    Fioricet 50-325-40 Mg Tabs (Butalbital-apap-caffeine) .[name]... 1-2 by mouth two times a day as needed migraine or tension ha  Complete Medication List: 1)  Aspirin Ec 325 Mg Tbec (Aspirin) .... Once daily 2)  Vitamin D3 1000 Unit Tabs (Cholecalciferol) .... 2 by mouth daily 3)  Fioricet 50-325-40 Mg Tabs (Butalbital-apap-caffeine) .[name].. 1-2 by mouth two times a day as needed migraine or tension ha 4)  Pennsaid 1.5 % Soln (Diclofenac sodium) .... 3-5 gtt on skin three times a day for pain  Other Orders: Admin 1st Vaccine (16109) Flu Vaccine 89yrs + (60454)  Patient Instructions: 1)  Heating pad to neck 2)  Use stretching and balance exercises that I have provided (15 min. or longer every day) 3)  RTC 1 month for injections if needed Prescriptions: PENNSAID 1.5 % SOLN (DICLOFENAC SODIUM) 3-5 gtt  on skin three times a day for pain  #1 x 3   Entered and Authorized by:   Tresa Garter MD   Signed by:   Tresa Garter MD on 06/04/2010   Method used:   Print then Give to Patient   RxID:   [id number] FIORICET 50-325-40 MG TABS (BUTALBITAL-APAP-CAFFEINE) 1-2 by mouth two times a day as needed migraine or tension HA  #60 x 2   Entered and Authorized by:   Tresa Garter MD   Signed by:   Tresa Garter MD on 06/04/2010   Method used:   Electronically to        [id number]* (retail)       [address] [place], [place]  [address]       Ph: [phone]       Fax: [phone]   RxID:   [id number]     .lbflu   Flu Vaccine Consent Questions     Do you have a history of severe allergic reactions  to this vaccine? no    Any prior history of allergic reactions to egg and/or gelatin? no    Do you have a sensitivity to the preservative Thimersol? no    Do you have a past history of Guillan-Barre Syndrome? no    Do you currently have an acute febrile illness? no    Have you ever had a severe reaction to latex? no    Vaccine information given and explained to patient? yes    Are you currently pregnant? no    Lot Number:[id number]   Exp Date:04/04/2011   Site Given  Left Deltoid IM Lanier Prude, [facility])  June 04, 2010 9:18 AM

## 2010-11-04 NOTE — Assessment & Plan Note (Signed)
Summary: neck pain/plot pt--plot off/cd   Vital Signs:  Patient profile:   40 year old female Menstrual status:  regular LMP:     03/12/2010 Weight:      134 pounds BMI:     23.82 O2 Sat:      98 % on Room air Temp:     97.8 degrees F oral Pulse rate:   72 / minute Pulse rhythm:   regular Resp:     16 per minute BP sitting:   132 / 80  (left arm) Cuff size:   regular  Vitals Entered By: Lamar Sprinkles, CMA (April 02, 2010 1:11 PM)  O2 Flow:  Room air CC: Neck pain/spasms, increased since sunday. Robaxin have no  relief. Motrin has given little relief. LMP (date): 03/12/2010     Menstrual Status regular Enter LMP: 03/12/2010   Primary Care Provider:  Tresa Garter MD  CC:  Neck pain/spasms and increased since sunday. Robaxin have no  relief. Motrin has given little relief..  History of Present Illness:  Neck Pain      This is a 40 year old woman who presents with Neck pain.  The problem began 3 days ago.  The intensity is described as moderate.  The patient reports left neck pain, but denies right neck pain, midline neck pain, and left shoulder pain.  Associated symptoms include impaired neck ROM.  The patient denies the following associated symptoms: numbness, weakness, impaired coordination, gait disturbance, tingling/parasthesias, fever, bladder dysfunction, bowel dysfunction, locking, and clicking.  The pain is described as sharp.  The pain is better with rest and NSAIDs.  History is significant for prior neck pain episodes.    Preventive Screening-Counseling & Management  Alcohol-Tobacco     Alcohol drinks/day: 0     Smoking Status: never  Hep-HIV-STD-Contraception     Hepatitis Risk: no risk noted     HIV Risk: no risk noted     STD Risk: no risk noted      Sexual History:  currently monogamous.        Drug Use:  never.        Blood Transfusions:  no.    Medications Prior to Update: 1)  Aspirin Ec 325 Mg  Tbec (Aspirin) .... Once Daily 2)  Tramadol  Hcl 50 Mg  Tabs (Tramadol Hcl) .[name].. 1po Two Times A Day As Needed Pain 3)  Vitamin D3 1000 Unit  Tabs (Cholecalciferol) .... 2 By Mouth Daily 4)  Robaxin 500 Mg Tabs (Methocarbamol) .[name].. 1 By Mouth Three Times A Day As Needed For Muscle Spasm and Pain 5)  Prednisone (Pak) 10 Mg Tabs (Prednisone) .... As Directed X 6 Days  Current Medications (verified): 1)  Aspirin Ec 325 Mg  Tbec (Aspirin) .... Once Daily 2)  Tramadol Hcl 50 Mg  Tabs (Tramadol Hcl) .[name].. 1po Two Times A Day As Needed Pain 3)  Vitamin D3 1000 Unit  Tabs (Cholecalciferol) .... 2 By Mouth Daily 4)  Robaxin 500 Mg Tabs (Methocarbamol) .[name].. 1 By Mouth Three Times A Day As Needed For Muscle Spasm and Pain 5)  Naprosyn 375 Mg Tabs (Naproxen) .... 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  hx of migraine  Past Surgical History: Last updated: 01/02/2008 s/p carotid anueurysm s/p coil tx - currently off plavix  s/p ovary cyst - 2001 hx of laparoscopy 2001  Family History: Last updated: 01/02/2008 heart disease and stroke - grandmother mother with leukemia father with lung cancer/smoking Family History of Alcoholism/Addiction breast cancer  Social History: Last updated: 06/03/2009 Married 2 children work - Diplomatic Services operational officer at [employer] Never Smoked Alcohol use-no emigre to [place] 1999 from [place]  Risk Factors: Alcohol Use: 0 (04/02/2010)  Risk Factors: Smoking Status: never (04/02/2010)  Family History: Reviewed history from 01/02/2008 and no changes required. heart disease and stroke - grandmother mother with leukemia father with lung cancer/smoking Family History of Alcoholism/Addiction breast cancer  Social  History: Reviewed history from 06/03/2009 and no changes required. 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    Physical Exam  General:  alert, well-developed, well-nourished, and cooperative to examination.    Mouth:  Oral mucosa and oropharynx without lesions or exudates.  Teeth in good repair. Neck:  supple, no masses, no thyromegaly, no thyroid nodules or tenderness, no JVD, normal carotid upstroke, no carotid bruits, and no cervical lymphadenopathy.   Lungs:  normal respiratory effort, no intercostal retractions, no accessory muscle use, normal breath sounds, no dullness, no fremitus, no crackles, and no wheezes.   Heart:  normal rate, regular rhythm, no murmur, no gallop, no rub, and no JVD.   Abdomen:  soft, non-tender, normal bowel sounds, no distention, no masses, no guarding, no rigidity, no rebound tenderness, no hepatomegaly, and no splenomegaly.   Msk:  normal ROM, no joint tenderness, no joint swelling, no joint warmth, no redness over joints, no joint deformities, no joint instability, no crepitation, and no muscle atrophy.   Pulses:  R and L carotid,radial,femoral,dorsalis pedis and posterior tibial pulses are full and equal bilaterally Extremities:  No clubbing, cyanosis, edema, or deformity noted with normal full range of motion of all joints.   Neurologic:  No cranial nerve deficits noted. Station and gait are normal. Plantar reflexes are down-going bilaterally. DTRs are symmetrical throughout. Sensory, motor and coordinative functions appear intact.   Detailed Back/Spine  Exam  General:    Well-developed, well-nourished, in no acute distress; alert and oriented x 3.    Gait:    Normal heel-toe gait pattern bilaterally.    Cervical Exam:  Inspection-deformity:    Normal Palpation-spinal tenderness:  Normal Range of Motion:    Forward Flexion:   80 degrees    Hyperextension:   75 degrees    Right Lat. Flexion:   25 degrees    Left Lat. Flexion:   20 degrees    Right Lat. Rotation:   60 degrees    Left Lat. Rotation:   55 degrees Spurling Maneuver:    negative Hoffman's Sign:    Right:  negative    Left:  negative   Impression & Recommendations:  Problem # 1:  NECK PAIN, LEFT (ICD-723.1) Assessment New  Her updated medication list for this problem includes:    Aspirin Ec 325 Mg Tbec (Aspirin) ..... Once daily    Tramadol  Hcl 50 Mg Tabs (Tramadol hcl) .[name]... 1po two times a day as needed pain    Robaxin 500 Mg Tabs (Methocarbamol) .[name]... 1 by mouth three times a day as needed for muscle spasm and pain    Naprosyn 375 Mg Tabs (Naproxen) ..... One by mouth two times a day as needed for neck pain  Orders: T-Cervical Spine Comp 4 Views (72050TC) Admin of Therapeutic Inj  intramuscular or subcutaneous (16109) Ketorolac-Toradol 15mg  (U0454)  Discussed exercises and use of moist heat or cold and medication.   Problem # 2:  TORTICOLLIS, SPASMODIC (ICD-333.83) Assessment: New  Complete Medication List: 1)  Aspirin Ec 325 Mg Tbec (Aspirin) .... Once daily 2)  Tramadol Hcl 50 Mg Tabs (Tramadol hcl) .[name].. 1po two times a day as needed pain 3)  Vitamin D3 1000 Unit Tabs (Cholecalciferol) .... 2 by mouth daily 4)  Robaxin 500 Mg Tabs (Methocarbamol) .[name].. 1 by mouth three times a day as needed for muscle spasm and pain 5)  Naprosyn 375 Mg Tabs (Naproxen) .... One by mouth two times a day as needed for neck pain  Patient Instructions: 1)  Please schedule a follow-up appointment in 2 weeks. Prescriptions: NAPROSYN 375 MG TABS (NAPROXEN) One by mouth  two times a day as needed for neck pain  #50 x 1   Entered and Authorized by:   Etta Grandchild MD   Signed by:   Etta Grandchild MD on 04/02/2010   Method used:   Electronically to        [facility] [address] [id number] (retail)       [address] [place], [place]  [address]       Ph: [phone]       Fax: [phone]   RxID:   [id number]    Medication Administration  Injection # 1:    Medication: Ketorolac-Toradol 15mg     Diagnosis: NECK PAIN, LEFT (ICD-723.1)    Route: IM    Site: RUOQ gluteus    Exp Date: 89-226-    Lot #: 02/03/2011    Mfr: [email or web address]    Patient tolerated injection without complications    Given by: Rock Nephew CMA (April 02, 2010 1:28 PM)  Orders Added: 1)  T-Cervical Spine Comp 4 Views [72050TC] 2)  Admin of Therapeutic Inj  intramuscular or subcutaneous [96372] 3)  Ketorolac-Toradol 15mg  [J1885] 4)  Est. Patient Level IV [66440]

## 2011-02-20 NOTE — Group Therapy Note (Signed)
NAMEDAVONA, KINOSHITA NO.:  [id number]   MEDICAL RECORD NO.:  [id number]          PATIENT TYPE:  WOC   LOCATION:  [facility]                   FACILITY:  [facility]   PHYSICIAN:  Elsie Lincoln, MD      DATE OF BIRTH:  [birth year]   DATE OF SERVICE:  10/14/2004                                    CLINIC NOTE   BRIEF HISTORY:  Patient is a 40 year old G1, para 1-0-0-1 female who  presents to our clinic after being seen at [facility] on August 25, 2004  for left-sided pain.  The patient had an ultrasound done which showed a  normal right ovary, a normal uterus with an IUD in place and the right and  left ovary also had a normal appearance 3.1 x 2.1 x 2.2.  In the right ovary  there was a probable degenerating corpus luteum 1.7 x 1.2 x 1.4 and there  was also moderate amount of free pelvic fluid.  Patient was given diagnosis  of probable ruptured cyst but to follow up on this questionable degenerating  corpus luteum.  Patient is asymptomatic currently.  Her GC and Chlamydia  were negative, her wet prep was negative for Trich and BV, patient has not  established GYN care since the birth of her child in 2002.  Patient wants to  get pregnant again and wants to have resolution of this ovarian issue before  conceiving.   PAST MEDICAL HISTORY:  Denies.   PAST SURGICAL HISTORY:  In 2001 had a laparoscopic cystectomy in [place].   GYNECOLOGIC HISTORY:  NSVD x1 in 2002; no recent Pap smear however, does not  report any abnormal Pap smears in the past; no STDs.   ALLERGIES:  None.   MEDICATIONS:  None.   FAMILY HISTORY:  Grandmother has breast cancer currently and undergoing  treatment; mother died of leukemia and father died of lung cancer.   REVIEW OF SYMPTOMS:  Negative.   PHYSICAL EXAMINATION:  VITAL SIGNS:  Temperature 97.6, pulse 68, blood  pressure 108/65, weight 129.9, height 5 feet 3 inches.  ABDOMEN:  Soft, nontender, nondistended.  GENITALIA:  Tanner 5.   Vagina pink, normal rugae, no discharge.  Cervix  closed, nontender.  Uterus retroverted; string from IUD seen through  cervical os.  The right ovary is palpated and nontender and not enlarged.  The left ovary cannot be palpated directly however, there was no mass.   ASSESSMENT AND PLAN:  [age]-year-old female para 1-0-0-1 last menstrual  period October 04, 2004 who presents for followup of ovarian cysts in  November 2005.   1.  Repeat transvaginal ultrasound.  2.  Pap smear today.  3.  Patient is to establish GYN care and needs breast exam at her annual      visit.  4.  We will discuss plans for pregnancy and removal of IUD.  5.  Return to clinic in 2-3 weeks.      KL/MEDQ  D:  10/14/2004  T:  10/14/2004  Job:  [id number]

## 2011-02-20 NOTE — Discharge Summary (Signed)
Linda, Stewart NO.:  [id number]   MEDICAL RECORD NO.:  [id number]          PATIENT TYPE:  INP   LOCATION:  9133                          [facility]ILITY:  [facility]   PHYSICIAN:  Zenaida Niece, M.D.DATE OF BIRTH:  1971/06/20   DATE OF ADMISSION:  07/07/2006  DATE OF DISCHARGE:  07/09/2006                                 DISCHARGE SUMMARY   ADMISSION DIAGNOSES:  1. Intrauterine pregnancy at 39 weeks.  2. Group B streptococcus carrier.   DISCHARGE DIAGNOSES:  1. Intrauterine pregnancy at 39 weeks.  2. Group B streptococcus carrier.   PROCEDURES:  On July 07, 2006, she had a spontaneous vaginal delivery with  some postpartum uterine atony.   HISTORY AND PHYSICAL:  This is a 40 year old gravida 2, para 1-0-0-1 with an  EGA of [redacted] weeks who presents with increasing contractions.  She was seen in  the office on the day of admission and cervix was 3+ and 75% effaced and on  admission she was 5-6, 80 and -1.  Prenatal care essentially uncomplicated  except for a questionable history of a CVA.  In early pregnancy she was seen  by [facility] who felt that she was not at an increased  risk and would not benefit from anticoagulation.   PRENATAL LABORATORIES:  Blood type is A+ with a negative antibody screen,  RPR nonreactive, rubella immune, hepatitis B surface antigen negative, HIV  negative, gonorrhea and chlamydia negative, group B strep is positive, 1-  hour glucola is 106.   PAST OBSTETRICAL HISTORY:  Vaginal delivery at term without complications.   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   PAST SURGICAL HISTORY:  Ovarian cystectomy.   PAST MEDICAL HISTORY:  Possible CVA and complex migraine.   PHYSICAL EXAMINATION:  She is afebrile with stable vital signs.  Fetal heart  tracing initially is slightly tachycardiac but reassuring.  Abdomen is  gravid and nontender.  Cervix on Dr.  Berenda Morale first exam is 4-5, 80, and  -1 and amniotomy revealed clear amniotic fluid.   HOSPITAL COURSE:  The patient was admitted and continued to contract on her  own.  Dr. Senaida Ores examined her and performed amniotomy.  She was put on  ampicillin for group B strep prophylaxis.  She then progressed to complete,  pushed well and on the evening of July 07, 2006 had a vaginal delivery of  a viable female infant with Apgars of 9 and 9 that weighed 8 pounds 7 ounces.  Placenta delivered spontaneous.  She had a small first-degree laceration  repaired with interrupted sutures of 2-0 Vicryl for hemostasis.  Her IV  infiltrated after delivery and she was given IM Pitocin.  She did have some  uterine atony so her IV was restarted and she was given IV Pitocin and  Methergine IM.  Estimated blood loss approximately 500 mL.  Postpartum she  had no significant complications.  Predelivery hemoglobin 12.8, postdelivery  12.2.  On postpartum day #2 she was felt to be stable enough for discharge  home.   DISCHARGE  INSTRUCTIONS:  Regular diet, pelvic rest, follow up in 6 weeks,  medications are over-the-counter ibuprofen as needed and she is given our  discharge pamphlet.      Zenaida Niece, M.D.  Electronically Signed     TDM/MEDQ  D:  07/09/2006  T:  07/09/2006  Job:  [id number]

## 2011-02-20 NOTE — Consult Note (Signed)
Linda Stewart, Linda Stewart NO.:  [id number]   MEDICAL RECORD NO.:  [id number]          PATIENT TYPE:  OUT   LOCATION:  XRAY                         FACILITY:  [facility]   PHYSICIAN:  Sanjeev K. Deveshwar, M.D.DATE OF BIRTH:  September 10, 1971   DATE OF CONSULTATION:  11/30/2006  DATE OF DISCHARGE:                                 CONSULTATION   BRIEF ADDENDUM:  It should be noted that the right internal carotid  artery aneurysm was treated on November 16, 2006 with a stent-assisted  coiling.      Delton See, P.A.    ______________________________  Grandville Silos. Corliss Skains, M.D.    DR/MEDQ  D:  12/01/2006  T:  12/01/2006  Job:  [id number]   cc:   Melvyn Novas, M.D.  Georgina Quint. Plotnikov, MD

## 2011-02-20 NOTE — Procedures (Signed)
[facility]                                PROCEDURE NOTE   Linda Stewart, Linda Stewart                       MRN:          [id number]  DATE:11/29/2006                            DOB:          22-Sep-1971    PROCEDURE:  Cryosurgery.   INDICATION:  Common warts, right hand.   Risks including nerve damage and incomplete procedure as well as  benefits were explained to the patient in detail.  She agreed to  proceed.   Two warts on fingers #2 and #3 were treated with liquid nitrogen in the  usual fashion.  Tolerated well.   COMPLICATIONS:  None.     Georgina Quint. Plotnikov, MD  Electronically Signed    AVP/MedQ  DD: 11/29/2006  DT: 11/29/2006  Job #: [id number]

## 2011-02-20 NOTE — H&P (Signed)
Linda Stewart, Linda Stewart              ACCOUNT NO.:  [id number]   MEDICAL RECORD NO.:  [id number]          PATIENT TYPE:  AMB   LOCATION:  SDS                          FACILITY:  [facility]   PHYSICIAN:  Sanjeev K. Deveshwar, M.D.DATE OF BIRTH:  21-Jan-1971   DATE OF ADMISSION:  11/16/2006  DATE OF DISCHARGE:                              HISTORY & PHYSICAL   CHIEF COMPLAINT:  Cerebral aneurysm.   HISTORY OF PRESENT ILLNESS:  This is a pleasant 40 year old Hispanic  female with a history of severe headaches associated with right-sided  paresthesias.  She was evaluated for a possible CVA while living in [place] in 2002.  She has no residual symptoms from the suspected stroke  at this time.   The patient has continued to have headaches.  In October, 2007, she had  a very brief episode of right-sided paresthesias.  The patient has been  followed by Dr. Vickey Huger and Dr. Posey Rea.  She had an MRI/MRA on  October 18, 2006, that showed a 5.2 mm x 4.2 mm saccular outpouching in  the superior hypophyseal region of the left internal carotid artery  intracranially.  The patient was referred to Dr. Corliss Skains who saw the  patient in consultation on October 29, 2006.  Treatment options for a  possible cerebral aneurysm were discussed along with risks and benefits.  The patient is admitted today to undergo cerebral angiogram and possible  stenting and/or coiling of the suspected aneurysm.   PAST MEDICAL HISTORY:  1. As noted, the patient had a previous CVA in 2002, although there      was no obvious evidence of a CVA by MRI.  2. She has a history of complex migraine headaches.  3. She reports a rapid irregular heart rate with exercise.  4. She reports she tires easily.  5. She has never had a stress test.  6. She recently gave birth to her second son 3-4 months ago.  She had      some headaches associated with the delivery.  7. She denies being pregnant at this time, although her last menstrual    period was in the beginning of January.   PAST SURGICAL HISTORY:  1. Significant for ovarian cyst surgery in 2001.  2. She denies any problems with anesthesia.   ALLERGIES:  NO KNOWN DRUG ALLERGIES.   MEDICATIONS AT THE TIME OF ADMISSION:  The patient had been on aspirin  and Plavix in the past, these were stopped during her pregnancy.  She  did receive aspirin and Plavix prior to her procedure today as well as  Ancef.   SOCIAL HISTORY:  The patient is married.  She has 2 sons.  She lives in  [place] with her husband.  She has never smoked.  She does not use  alcohol to any significant degree.  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   FAMILY HISTORY:  Her mother died in her 43's from leukemia, her father  died in his 33's from lung cancer.  There is no family history of  aneurysms or strokes.  LABORATORY DATA:  An INR is 1, PTT is 35, hemoglobin 12.5, hematocrit  37, WBCs 4000, platelets 237,000.  A BUN 12, creatinine 0.58, potassium  3.8, glucose 93.  A GFR was greater than 60.  A urine pregnancy test was  negative.  A serum pregnancy test was negative.   REVIEW OF SYSTEMS:  Is completely negative except for occasional  headaches.  She also notes a fullness in her head when she bends over.  She has had recent diarrhea which she feels is related to anxiety  regarding her intervention today.   PHYSICAL EXAM:  Reveals a pleasant 40 year old Hispanic female in no  acute distress.  VITAL SIGNS:  Blood pressure 98/63, pulse 86, respirations 16,  temperature 97.7, oxygen saturation 96% on room air.  HEENT:  Unremarkable.  NECK:  No bruits, no jugular venous distention.  HEART:  Regular rate and rhythm without murmur.  LUNGS:  Clear.  ABDOMEN:  Soft, nontender.  EXTREMITIES:  Pulses intact without edema.  AIRWAY:  Rated at a 1.  ASA SCALE:  Rated at a 2.  NEUROLOGICAL:  Mental status:  The patient is alert and oriented and  follows commands.   Cranial nerves II-XII are grossly intact.  Sensation  is intact to light touch.  Motor strength is 5/5 throughout.  Cerebellar  testing is intact.   IMPRESSION:  1. 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  2. History of a cerebrovascular accident in 2002 with no residual      deficits.  3. History of complex migraine headaches.  4. History of irregular heart rate related to exercise.  5. Childbirth x2.  6. History of headaches.  7. History of ovarian cyst surgery with no previous problems with      anesthesia.   PLAN:  As noted, the patient will undergo cerebral angiogram today with  possible stenting and/or coiling of her cerebral aneurysm if felt to be  safe and indicated.      Delton See, P.A.    ______________________________  Grandville Silos. Corliss Skains, M.D.    DR/MEDQ  D:  11/16/2006  T:  11/16/2006  Job:  [id number]   cc:   Georgina Quint. Plotnikov, MD  Melvyn Novas, M.D.

## 2011-02-20 NOTE — Consult Note (Signed)
Linda Stewart, AHOLA NO.:  [id number]   MEDICAL RECORD NO.:  [id number]          PATIENT TYPE:  OUT   LOCATION:  XRAY                         FACILITY:  [facility]   PHYSICIAN:  Sanjeev K. Deveshwar, M.D.DATE OF BIRTH:  1970/11/28   DATE OF CONSULTATION:  11/30/2006  DATE OF DISCHARGE:                                 CONSULTATION   DATE OF CONSULTATION:  November 30, 2006.   CHIEF COMPLAINT:  Status post cerebral aneurysm coiling.   HISTORY OF PRESENT ILLNESS:  This is a very pleasant 40 year old  [place] female who was referred to Dr. 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  Dr. Corliss Skains saw the patient  in consultation on October 29, 2006 and on November 16, 2006 she  underwent coiling of the aneurysm performed under general anesthesia.  The patient tolerated the procedure well.  There were no known or  immediate complications.  She was discharged from the hospital on  November 17, 2006.  She returns today to be seen in follow-up.   PAST MEDICAL HISTORY:  1. Significant for a previous CVA in 2002 while living in [place].      She has no residual neurological deficits from this CVA and no CVA      was noted on MRI.  2. She has a history of complex migraine headaches.  3. She has had rapid irregular heart rates in the past associated with      exercise.  She reports she tires easily.  She gave birth to her      second son 3-4 months ago.   PAST MEDICAL AND SURGICAL HISTORY:  Significant for ovarian cyst surgery  in 2001.  She denies any previous problems with anesthesia.   ALLERGIES:  NO KNOWN DRUG ALLERGIES.   MEDICATIONS:  The patient is currently on aspirin and Plavix.   SOCIAL HISTORY:  The patient is married.  She has two sons.  She lives  in [place] with her husband.  She has never smoked.  She does not use  alcohol to any significant  degree.  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   FAMILY HISTORY:  Mother died in her 81s from leukemia.  Her father died  in his 41s from lung cancer.  There is no family history of aneurysms or  strokes.   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  The patient  reports she has been doing well.  She denies any significant headaches.  She denies any type of neurological deficits or problems.  She does  report that she continues to tire easily.  She has mild shortness of  breath, a heavy sensation between her shoulder blades, and mild right  arm discomfort associated with activities.  We have suggested that she  discuss this with her primary  care physician, Dr. Posey Rea, to Stewart if  he feels that a stress test might be indicated for further cardiac  evaluation.   Overall the patient is doing well.  She continues on aspirin and Plavix.  Dr. Corliss Skains instructed her to change the Plavix to every other day and  to continue this medication for one more weak and then stop taking the  Plavix.  She is to continue on the aspirin indefinitely.   The patient was given permission to return to work and permission to  resume her normal activities.  She has stopped breast feeding as  instructed due to her recent contrast dye exposure.   A repeat cerebral angiogram will be performed in approximately 3 months.  The patient was told to contact us in the meantime for any neurological  problems.  Greater than 25 minutes was spent on this consult.      Linda Stewart, P.A.    ______________________________  Linda Stewart. Corliss Skains, M.D.    DR/MEDQ  D:  12/01/2006  T:  12/01/2006  Job:  [id number]   cc:   Georgina Quint. Plotnikov, MD  Melvyn Novas, M.D.

## 2011-02-20 NOTE — Discharge Summary (Signed)
NAMEJASON, HAUGE NO.:  [id number]   MEDICAL RECORD NO.:  [id number]           PATIENT TYPE:   LOCATION:                                 FACILITY:   PHYSICIAN:  Sanjeev K. Deveshwar, M.D.  DATE OF BIRTH:   DATE OF ADMISSION:  11/16/2006  DATE OF DISCHARGE:  11/17/2006                               DISCHARGE SUMMARY   CHIEF COMPLAINT:  Cerebral aneurysm.   HISTORY OF PRESENT ILLNESS:  This is a very pleasant 41 year old  [place] female with a history of severe headaches associated with  right-sided paresthesias. She reports having had a CVA while living in  [place] in 2002. She has no residual neurological deficits.   The patient had a very brief episode of right-sided paresthesias in  October 2007. She has been followed by Dr. Vickey Huger and Dr. Posey Rea.  She had an MRI/MRA on October 18, 2006 that showed a 5.2 mm x 4.2 mm  saccular outpouching in the superior hypophyseal region of the left  internal carotid artery intracranially. The patient was referred to Dr.  Corliss Skains who saw the patient in consultation on October 29, 2006.  Arrangements were made to have the patient return on November 16, 2006  for possible stenting and/or coiling of the aneurysm. She was admitted  for that procedure.   PAST MEDICAL HISTORY:  Significant for previous CVA in 2002 with no  residual deficits and no significant CVA noted by MRI. She has a history  of complex migraine headaches. She has a history of a rapid irregular  heart rate associated with exercise. She tires easily. She has never had  a stress test. She recently gave birth to her second son 3-4 months ago.  She had some headaches associated with the delivery.   PAST SURGICAL HISTORY:  Significant for an ovarian cyst surgery in 2001.  She denies any problems with anesthesia.   ALLERGIES:  No known drug allergies.   SOCIAL HISTORY:  The patient is married. She has two sons. She lives in  [place] with  her husband. She and her husband are both from [place].  She has never smoked. She does not use alcohol to any significant  degree. 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   FAMILY HISTORY:  Her mother died in her 20s from leukemia. Her father  died in his 77s from lung cancer. There is no family history of  aneurysms or strokes.   HOSPITAL COURSE:  As noted this patient was admitted to [facility] on November 16, 2006 for a cerebral angiogram and possible  stenting and/or coiling of her cerebral aneurysm. The angiogram did  confirm the aneurysm. Dr. Corliss Skains did perform a stent assisted coiling  under general anesthesia with no immediate or known complications.  Please see his dictated report for full details.   Following the intervention the patient was taken to the post anesthesia  care unit where she remained overnight due to a problem with bed  availability. The patient was maintained on IV heparin overnight. The  following day the heparin  was stopped. The right femoral groin sheath  was removed. Hemostasis was obtained. The patient is currently on  bedrest for six hours. At the end of her bedrest she will be ambulated  with assistance. If she remains stable we will proceed with discharge.   LABORATORY DATA:  On the day of discharge CBC revealed hemoglobin 10,  hematocrit 29.5, WBC 4000, platelets 195,000. A basic metabolic panel  revealed a BUN 7, creatinine 0.51, potassium 3.5, glucose was 99. Her  GFR was greater than 60. A urine pregnancy and serum pregnancy test were  both negative on the day of admission. A CBC on February 8 had revealed  hemoglobin 12.5, hematocrit 37.   DISCHARGE INSTRUCTIONS:  The patient is to be on aspirin and Plavix  until seen in follow-up by Dr. Corliss Skains. She will be given a  prescription for Plavix.   The patient is told to resume her regular diet. She was given  instructions regarding wound care.  She was  told not to drive or do  anything strenuous or return to work for at least two weeks. A follow-up  angiogram will be recommended in approximately three months. The patient  was told to follow up with her primary care physician, Dr. Posey Rea,  within 1-2 weeks for repeat CBC.  She will see Dr. Corliss Skains in  approximately two weeks.   IMPRESSION:  1. 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  2. Status post stent assisted coiling of the aneurysm performed      November 16, 2006 under general anesthesia by Dr. Corliss Skains.  3. History of headaches.  4. History of previous cerebrovascular accident in 2002.  5. Questionable transient ischemic attacks.  6. History of complex migraine headaches.  7. History of rapid irregular heart rate associated with exercise.  8. Easy fatigability.  9. Recent childbirth approximately 3-4 months ago. The patient is      currently breastfeeding. She will be instructed to discontinue      breastfeeding due to the use of the contrast dye during her      intervention.  10.History of ovarian cyst surgery.  11.Mild anemia.      Delton See, P.A.    ______________________________  Grandville Silos. Corliss Skains, M.D.    DR/MEDQ  D:  11/17/2006  T:  11/17/2006  Job:  [id number]   cc:   Georgina Quint. Plotnikov, MD  Melvyn Novas, M.D.

## 2011-02-20 NOTE — Consult Note (Signed)
[name], [name] NO.:  [id number]   MEDICAL RECORD NO.:  [id number]          PATIENT TYPE:  OUT   LOCATION:  XRAY                         FACILITY:  [facility]   PHYSICIAN:  Sanjeev K. Deveshwar, M.D.DATE OF BIRTH:  Sep 13, 1971   DATE OF CONSULTATION:  10/29/2006  DATE OF DISCHARGE:                                 CONSULTATION   CHIEF COMPLAINT:  Suspected cerebral aneurysm.   HISTORY OF PRESENT ILLNESS:  This is a very pleasant, 40 year old,  Hispanic female with a history of severe headaches associated with right  sided paresthesias.  She was evaluated for a possible stroke while  living in [place] in 2002.  She has no residual symptoms at this  time.  The patient has continued to have headaches.  In October 2007,  she did have a very brief episode of right sided paresthesias.  She has  been followed by Dr. Vickey Huger and Dr. Posey Rea.  The patient had an  MRI/MRA on October 18, 2006, that showed a 5.2-mm x 4.2-mm saccular  outpouching in the superior hypophyseal region of the left internal  carotid artery, intracranially.  She has been referred to Dr. Corliss Skains  for further evaluation and treatment options.  She denies any syncope or  any visual problems.  She states at times her headaches last all day.  The patient presents today accompanied by her husband and an interpreter  to be seen in consultation.   PAST MEDICAL HISTORY:  Significant for:  1. The above-noted possible previous CVA in 2002, although Dr.      Corliss Skains did not see any obvious CVAs by MRI.  2. The patient has a history of complex migraines.  3. She also reports a rapid irregular heart rate with exercise and      reports that she tires easily.  She has never had a stress test.  4. She gave birth to her second son approximately 3 months ago.  She      did have some headaches associated with the delivery.   PAST SURGICAL HISTORY:  Significant for ovarian cyst surgery in 2001.  She denies  any problems with anesthesia.   ALLERGIES:  NO KNOWN DRUG ALLERGIES.  SHE IS NOT ALLERGIC TO SHRIMP,  IODINE, SHELLFISH, OR LATEX.   CURRENT MEDICATIONS:  The patient had been on aspirin and Plavix in the  past.  These were stopped during her pregnancy.   SOCIAL HISTORY:  The patient is married.  She has two sons.  She lives  in [place] with her husband.  She has never smoked.  She does not use  alcohol to a significant degree.  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   FAMILY HISTORY:  Her mother died in her 5s, from leukemia.  Her father  died in his 101s, from lung cancer.  There is no family history of  aneurysms or strokes.   IMPRESSION/PLAN:  As noted the patient has been referred to Dr.  Corliss Skains for further evaluation of a possible cerebral aneurysm.  Dr.  Corliss Skains reviewed the results of the recent  MRI, MRA performed on  October 18, 2006, with the patient and her husband.  He pointed out the  area of concern.  He felt that this had a 95% chance of being an actual  aneurysm.  Aneurysms in general were explained to the patient as well as  the risks that go along with this diagnosis.  Treatment options were  explained including continued monitoring, open surgery by craniotomy  with clipping, and endovascular coiling and/or stenting.  The procedures  were described in detail along with the risks and benefits.  Dr.  Corliss Skains did explain that we would not know for certain whether this  was an aneurysm until she had a cerebral angiogram.   The patient and her husband are interested in proceeding with the  angiogram and possible endovascular treatment if felt to be safe and  indicated.  They were given some written information to study at home.  They were told to contact us with her final decision.   Greater than 40 minutes was spent on this consult.      Delton See, P.A.    ______________________________  Grandville Silos. Corliss Skains,  M.D.    DR/MEDQ  D:  10/29/2006  T:  10/29/2006  Job:  [id number]   cc:   Georgina Quint. Plotnikov, MD  Melvyn Novas, M.D.

## 2011-06-10 ENCOUNTER — Other Ambulatory Visit: Payer: Self-pay | Admitting: Obstetrics and Gynecology

## 2011-06-10 DIAGNOSIS — Z1231 Encounter for screening mammogram for malignant neoplasm of breast: Secondary | ICD-10-CM

## 2011-06-16 ENCOUNTER — Telehealth: Payer: Self-pay | Admitting: *Deleted

## 2011-06-16 DIAGNOSIS — Z0000 Encounter for general adult medical examination without abnormal findings: Secondary | ICD-10-CM

## 2011-06-16 NOTE — Telephone Encounter (Signed)
Labs entered.

## 2011-06-16 NOTE — Telephone Encounter (Signed)
Message copied by Merrilyn Puma on Tue Jun 16, 2011 12:00 PM ------      Message from: Etheleen Sia      Created: Tue Jun 16, 2011  9:31 AM      Regarding: PHYSICAL LABS       OCT 12 APPT FOR PHYSICAL

## 2011-06-18 ENCOUNTER — Ambulatory Visit
Admission: RE | Admit: 2011-06-18 | Discharge: 2011-06-18 | Disposition: A | Discharge status: Home / Self Care | Payer: BC Managed Care – PPO | Source: Ambulatory Visit | Attending: Obstetrics and Gynecology | Admitting: Obstetrics and Gynecology

## 2011-06-18 DIAGNOSIS — Z1231 Encounter for screening mammogram for malignant neoplasm of breast: Secondary | ICD-10-CM

## 2011-07-01 LAB — CBC
Hemoglobin: 13
MCHC: 33
RBC: 4.83

## 2011-07-01 LAB — BASIC METABOLIC PANEL
Calcium: 9.3
GFR calc non Af Amer: >60
Potassium: 3.5
Sodium: 140

## 2011-07-01 LAB — PROTIME-INR
INR: 0.9
Prothrombin Time: 12.5

## 2011-07-10 ENCOUNTER — Other Ambulatory Visit (INDEPENDENT_AMBULATORY_CARE_PROVIDER_SITE_OTHER): Payer: BC Managed Care – PPO

## 2011-07-10 DIAGNOSIS — Z0000 Encounter for general adult medical examination without abnormal findings: Secondary | ICD-10-CM

## 2011-07-10 LAB — CBC WITH DIFFERENTIAL/PLATELET
Basophils Absolute: 0 10*3/uL (ref 0.0–0.1)
Eosinophils Absolute: 0.1 10*3/uL (ref 0.0–0.7)
HCT: 37.9 % (ref 36.0–46.0)
Hemoglobin: 12.3 g/dL (ref 12.0–15.0)
Lymphs Abs: 1.4 10*3/uL (ref 0.7–4.0)
MCHC: 32.5 g/dL (ref 30.0–36.0)
Monocytes Absolute: 0.3 10*3/uL (ref 0.1–1.0)
Neutro Abs: 1.9 10*3/uL (ref 1.4–7.7)
Platelets: 235 10*3/uL (ref 150.0–400.0)
RDW: 14 % (ref 11.5–14.6)

## 2011-07-10 LAB — BASIC METABOLIC PANEL
BUN: 16 mg/dL (ref 6–23)
CO2: 29 mEq/L (ref 19–32)
Chloride: 104 mEq/L (ref 96–112)
GFR: 109.85 mL/min (ref >60.00)
Glucose, Bld: 95 mg/dL (ref 70–99)
Potassium: 4.1 mEq/L (ref 3.5–5.1)
Sodium: 140 mEq/L (ref 135–145)

## 2011-07-10 LAB — URINALYSIS, ROUTINE W REFLEX MICROSCOPIC
Ketones, ur: NEGATIVE
Leukocytes, UA: NEGATIVE
Specific Gravity, Urine: 1.025 (ref 1.000–1.030)
Urine Glucose: NEGATIVE
Urobilinogen, UA: 0.2 (ref 0.0–1.0)

## 2011-07-10 LAB — HEPATIC FUNCTION PANEL
ALT: 13 U/L (ref 0–35)
AST: 18 U/L (ref 0–37)
Albumin: 4.3 g/dL (ref 3.5–5.2)
Alkaline Phosphatase: 48 U/L (ref 39–117)
Total Protein: 7.8 g/dL (ref 6.0–8.3)

## 2011-07-10 LAB — LIPID PANEL: Cholesterol: 175 mg/dL (ref 0–200)

## 2011-07-10 LAB — TSH: TSH: 2.57 u[IU]/mL (ref 0.35–5.50)

## 2011-07-16 ENCOUNTER — Encounter: Payer: Self-pay | Admitting: Internal Medicine

## 2011-07-17 ENCOUNTER — Ambulatory Visit (INDEPENDENT_AMBULATORY_CARE_PROVIDER_SITE_OTHER): Payer: BC Managed Care – PPO | Admitting: Internal Medicine

## 2011-07-17 ENCOUNTER — Encounter: Payer: Self-pay | Admitting: Internal Medicine

## 2011-07-17 DIAGNOSIS — Z8679 Personal history of other diseases of the circulatory system: Secondary | ICD-10-CM

## 2011-07-17 DIAGNOSIS — Z0000 Encounter for general adult medical examination without abnormal findings: Secondary | ICD-10-CM | POA: Insufficient documentation

## 2011-07-17 DIAGNOSIS — D485 Neoplasm of uncertain behavior of skin: Secondary | ICD-10-CM | POA: Insufficient documentation

## 2011-07-17 NOTE — Assessment & Plan Note (Signed)
2012 L post neck and L post shoulder Skin bx

## 2011-07-17 NOTE — Progress Notes (Signed)
  Subjective:    Patient ID: Linda Stewart, female    DOB: 09/01/1971, 40 y.o.   MRN: [id number]  HPI  The patient is here for a wellness exam. The patient has been doing well overall without major physical or psychological issues going on lately.   Review of Systems  Constitutional: Negative for chills, activity change, appetite change, fatigue and unexpected weight change.  HENT: Negative for congestion, sore throat, mouth sores and sinus pressure.   Eyes: Negative for pain, discharge and visual disturbance.  Respiratory: Negative for cough and chest tightness.   Gastrointestinal: Negative for nausea, abdominal pain and diarrhea.  Genitourinary: Negative for dysuria, urgency, frequency, flank pain, difficulty urinating, vaginal pain, menstrual problem, pelvic pain and dyspareunia.  Musculoskeletal: Negative for back pain and gait problem.  Skin: Negative for pallor and rash.  Neurological: Negative for dizziness, tremors, seizures, syncope, facial asymmetry, speech difficulty, weakness, light-headedness, numbness and headaches.  Psychiatric/Behavioral: Negative for suicidal ideas, confusion, sleep disturbance and decreased concentration. The patient is not nervous/anxious.        Objective:   Physical Exam  Constitutional: She appears well-developed and well-nourished. No distress.  HENT:  Head: Normocephalic.  Right Ear: External ear normal.  Left Ear: External ear normal.  Nose: Nose normal.  Mouth/Throat: Oropharynx is clear and moist.  Eyes: Conjunctivae are normal. Pupils are equal, round, and reactive to light. Right eye exhibits no discharge. Left eye exhibits no discharge.  Neck: Normal range of motion. Neck supple. No JVD present. No tracheal deviation present. No thyromegaly present.  Cardiovascular: Normal rate, regular rhythm and normal heart sounds.   Pulmonary/Chest: No stridor. No respiratory distress. She has no wheezes.  Abdominal: Soft. Bowel sounds are  normal. She exhibits no distension and no mass. There is no tenderness. There is no rebound and no guarding.  Musculoskeletal: She exhibits no edema and no tenderness.  Lymphadenopathy:    She has no cervical adenopathy.  Neurological: She displays normal reflexes. No cranial nerve deficit. She exhibits normal muscle tone. Coordination normal.  Skin: No rash noted. No erythema.  Psychiatric: She has a normal mood and affect. Her behavior is normal. Judgment and thought content normal.   Moles on neck  Lab Results  Component Value Date   WBC 3.7* 07/10/2011   HGB 12.3 07/10/2011   HCT 37.9 07/10/2011   PLT 235.0 07/10/2011   GLUCOSE 95 07/10/2011   CHOL 175 07/10/2011   TRIG 67.0 07/10/2011   HDL 57.40 07/10/2011   LDLCALC 104* 07/10/2011   ALT 13 07/10/2011   AST 18 07/10/2011   NA 140 07/10/2011   K 4.1 07/10/2011   CL 104 07/10/2011   CREATININE 0.6 07/10/2011   BUN 16 07/10/2011   CO2 29 07/10/2011   TSH 2.57 07/10/2011   INR 0.9 02/24/2008        Assessment & Plan:

## 2011-07-22 ENCOUNTER — Telehealth: Payer: Self-pay | Admitting: *Deleted

## 2011-07-22 DIAGNOSIS — Z0000 Encounter for general adult medical examination without abnormal findings: Secondary | ICD-10-CM

## 2011-07-22 NOTE — Telephone Encounter (Signed)
CPE scheduled for 07/2012. Labs entered.

## 2011-07-23 NOTE — Assessment & Plan Note (Addendum)
We discussed age appropriate health related issues, including available/recomended screening tests and vaccinations. We discussed a need for adhering to healthy diet and exercise. Labs/EKG were reviewed/ordered. All questions were answered.  GYN q 12 mo 

## 2011-07-23 NOTE — Assessment & Plan Note (Signed)
F/u w/Neurology Continue with current prescription therapy as reflected on the Med list.

## 2011-09-22 ENCOUNTER — Ambulatory Visit: Payer: BC Managed Care – PPO | Admitting: Internal Medicine

## 2011-11-24 ENCOUNTER — Ambulatory Visit (INDEPENDENT_AMBULATORY_CARE_PROVIDER_SITE_OTHER): Payer: BC Managed Care – PPO | Admitting: Internal Medicine

## 2011-11-24 ENCOUNTER — Other Ambulatory Visit (INDEPENDENT_AMBULATORY_CARE_PROVIDER_SITE_OTHER): Payer: BC Managed Care – PPO

## 2011-11-24 ENCOUNTER — Encounter: Payer: Self-pay | Admitting: Internal Medicine

## 2011-11-24 VITALS — BP 100/70 | HR 84 | Temp 98.3°F | Resp 16 | Wt 139.0 lb

## 2011-11-24 DIAGNOSIS — R202 Paresthesia of skin: Secondary | ICD-10-CM

## 2011-11-24 DIAGNOSIS — D485 Neoplasm of uncertain behavior of skin: Secondary | ICD-10-CM

## 2011-11-24 DIAGNOSIS — R209 Unspecified disturbances of skin sensation: Secondary | ICD-10-CM

## 2011-11-24 DIAGNOSIS — D489 Neoplasm of uncertain behavior, unspecified: Secondary | ICD-10-CM

## 2011-11-24 LAB — CBC WITH DIFFERENTIAL/PLATELET
Basophils Relative: 0.5 % (ref 0.0–3.0)
Eosinophils Absolute: 0.1 10*3/uL (ref 0.0–0.7)
HCT: 36.7 % (ref 36.0–46.0)
Hemoglobin: 11.9 g/dL — ABNORMAL LOW (ref 12.0–15.0)
Lymphocytes Relative: 40.1 % (ref 12.0–46.0)
MCHC: 32.4 g/dL (ref 30.0–36.0)
Neutro Abs: 2.4 10*3/uL (ref 1.4–7.7)
RBC: 4.48 Mil/uL (ref 3.87–5.11)

## 2011-11-24 LAB — SEDIMENTATION RATE: Sed Rate: 29 mm/hr — ABNORMAL HIGH (ref 0–22)

## 2011-11-25 ENCOUNTER — Telehealth: Payer: Self-pay | Admitting: Internal Medicine

## 2011-11-25 ENCOUNTER — Encounter: Payer: Self-pay | Admitting: Internal Medicine

## 2011-11-25 DIAGNOSIS — E538 Deficiency of other specified B group vitamins: Secondary | ICD-10-CM | POA: Insufficient documentation

## 2011-11-25 DIAGNOSIS — R202 Paresthesia of skin: Secondary | ICD-10-CM | POA: Insufficient documentation

## 2011-11-25 MED ORDER — VITAMIN B-12 500 MCG SL SUBL: 1.0000 | SUBLINGUAL_TABLET | SUBLINGUAL | Status: AC

## 2011-11-25 NOTE — Progress Notes (Signed)
  Subjective:    Patient ID: Linda Stewart, female    DOB: 03/29/1971, 41 y.o.   MRN: [id number]  HPI  Skin bx   Review of Systems     Objective:   Physical Exam    Procedure Note :     Procedure :  Skin biopsy   Indication:  Changing mole (s )   Risks including unsuccessful procedure , bleeding, infection, bruising, scar, a need for another complete procedure and others were explained to the patient in detail as well as the benefits. Informed consent was obtained and signed.   The patient was placed in a decubitus position.  Lesion #1 on  L posterior neck base   measuring   4  mm   Skin over lesion #1  was prepped with Betadine and alcohol  and anesthetized with 1 cc of 2% lidocaine and epinephrine, using a 25-gauge 1 inch needle.  Shave biopsy with a sterile Dermablade was carried out in the usual fashion.Hyfrecator was used to destroy the rest of the lesion potentially left behind and for hemostasis. Band-Aid was applied with antibiotic ointment.    Lesion #2 on L posterir trap   measuring  4 mm   Skin over lesion #2  was prepped with Betadine and alcohol  and anesthetized with 1 cc of 2% lidocaine and epinephrine, using a 25-gauge 1 inch needle.  Shave biopsy with a sterile Dermablade was carried out in the usual fashion. Hyfrecator was used to destroy the rest of the lesion potentially left behind and for hemostasis. Band-Aid was applied with antibiotic ointment.    Postprocedure instructions :    A Band-Aid should be  changed twice daily. You can take a shower tomorrow.  Keep the wounds clean. You can wash them with liquid soap and water. Pat dry with gauze or a Kleenex tissue  Before applying antibiotic ointment and a Band-Aid.   You need to report immediately  if fever, chills or any signs of infection develop.    The biopsy results should be available in 1 -2 weeks.       Assessment & Plan:

## 2011-11-25 NOTE — Assessment & Plan Note (Signed)
F/u w/Dr Dohmeier 

## 2011-11-25 NOTE — Assessment & Plan Note (Signed)
See procedure 

## 2011-11-25 NOTE — Telephone Encounter (Signed)
Linda Stewart, please, inform patient that all labs are normal except for low nl Vit B12. Start B12 Tart Rx  Thx

## 2011-11-26 NOTE — Telephone Encounter (Signed)
Notified pt. 

## 2011-11-27 ENCOUNTER — Telehealth: Payer: Self-pay | Admitting: Internal Medicine

## 2011-11-27 NOTE — Telephone Encounter (Signed)
Linda Stewart, please, inform patient that her bx was benign Thx  

## 2011-11-27 NOTE — Telephone Encounter (Signed)
Left detailed mess informing pt of below.  

## 2012-01-26 ENCOUNTER — Telehealth: Payer: Self-pay | Admitting: *Deleted

## 2012-01-26 DIAGNOSIS — R51 Headache: Secondary | ICD-10-CM

## 2012-01-26 DIAGNOSIS — Z8673 Personal history of transient ischemic attack (TIA), and cerebral infarction without residual deficits: Secondary | ICD-10-CM

## 2012-01-26 NOTE — Telephone Encounter (Signed)
Pt is calling inquiring about referral to Dr. Bjorn Pippin. ? Neuro. Please advise.

## 2012-01-27 NOTE — Telephone Encounter (Signed)
Ok Done Thx 

## 2012-06-08 ENCOUNTER — Encounter: Payer: Self-pay | Admitting: Internal Medicine

## 2012-06-08 ENCOUNTER — Ambulatory Visit (INDEPENDENT_AMBULATORY_CARE_PROVIDER_SITE_OTHER): Payer: BC Managed Care – PPO | Admitting: Internal Medicine

## 2012-06-08 VITALS — BP 110/80 | HR 76 | Temp 97.8°F | Resp 16 | Wt 134.0 lb

## 2012-06-08 DIAGNOSIS — Z8679 Personal history of other diseases of the circulatory system: Secondary | ICD-10-CM

## 2012-06-08 DIAGNOSIS — D689 Coagulation defect, unspecified: Secondary | ICD-10-CM

## 2012-06-08 DIAGNOSIS — G243 Spasmodic torticollis: Secondary | ICD-10-CM

## 2012-06-08 DIAGNOSIS — R51 Headache: Secondary | ICD-10-CM

## 2012-06-08 DIAGNOSIS — E538 Deficiency of other specified B group vitamins: Secondary | ICD-10-CM

## 2012-06-08 DIAGNOSIS — M549 Dorsalgia, unspecified: Secondary | ICD-10-CM

## 2012-06-08 MED ORDER — TRAMADOL HCL 50 MG PO TABS: 50.0000 mg | ORAL_TABLET | Freq: Two times a day (BID) | ORAL | Status: AC | PRN

## 2012-06-08 MED ORDER — MELOXICAM 15 MG PO TABS: 15.0000 mg | ORAL_TABLET | Freq: Every day | ORAL | Status: DC | PRN

## 2012-06-08 NOTE — Assessment & Plan Note (Signed)
On ASA qd

## 2012-06-08 NOTE — Assessment & Plan Note (Addendum)
ROM exercises given X ray 2010 reviewed - no OA Meloxicam Trmadol prn

## 2012-06-08 NOTE — Assessment & Plan Note (Signed)
Neuol f/u Fioricet prn

## 2012-06-08 NOTE — Assessment & Plan Note (Signed)
except

## 2012-06-08 NOTE — Patient Instructions (Signed)
Hip opener exercises, lumbar stretch  -- [email or web address]

## 2012-06-08 NOTE — Assessment & Plan Note (Signed)
Resolved

## 2012-06-08 NOTE — Progress Notes (Signed)
Subjective:    Patient ID: Linda Stewart, female    DOB: 03-27-1971, 41 y.o.   MRN: [id number]  Back Pain This is a new problem. The current episode started 1 to 4 weeks ago. The problem occurs intermittently. The problem has been waxing and waning since onset. The pain is present in the lumbar spine. The quality of the pain is described as stabbing. The pain does not radiate. The pain is at a severity of 9/10. The pain is severe. The pain is worse during the day. The symptoms are aggravated by bending, position and sitting. Associated symptoms include headaches. Pertinent negatives include no abdominal pain, dysuria, numbness, pelvic pain or weakness. She has tried ice for the symptoms. The treatment provided no relief.   C/o HA's; h/o CVA  Wt Readings from Last 3 Encounters:  06/08/12 134 lb (60.782 kg)  11/24/11 139 lb (63.05 kg)  07/17/11 138 lb (62.596 kg)   BP Readings from Last 3 Encounters:  06/08/12 110/80  11/24/11 100/70  07/17/11 110/60   Past Medical History  Diagnosis Date  . 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 Antithrombin 3 deficiency   . Migraine   . Ovarian cyst   . Carotid artery aneurysm    Past Surgical History  Procedure Date  . Carotid aneurysm s/p coil treatment     Currently OFF plavix  . Ovarian cyst surgery 2001  . Laparoscopy 2001    reports that she has never smoked. She does not have any smokeless tobacco history on file. She reports that she does not drink alcohol or use illicit drugs. family history includes Alcohol abuse in her other; Breast cancer in her other; Cancer (age of onset:40) in her mother; Cancer (age of onset:61) in her father; Heart disease in her other; Leukemia in her mother; Lung cancer in her father; and Stroke in her other. No Known Allergies  Current Outpatient Prescriptions on File Prior to Visit    Medication Sig Dispense Refill  . aspirin 325 MG EC tablet Take 325 mg by mouth daily.        . butalbital-acetaminophen-caffeine (FIORICET, ESGIC) 50-325-40 MG per tablet Take 1-2 tablets by mouth 2 (two) times daily as needed.        . cholecalciferol (VITAMIN D) 1000 UNITS tablet Take 2,000 Units by mouth daily.        . Cyanocobalamin (VITAMIN B-12) 500 MCG SUBL Place 1 tablet (500 mcg total) under the tongue 1 day or 1 dose.  100 tablet  3  . loratadine (CLARITIN) 10 MG tablet Take 10 mg by mouth daily as needed.             Review of Systems  Constitutional: Negative for chills, activity change, appetite change, fatigue and unexpected weight change.  HENT: Negative for congestion, sore throat, mouth sores and sinus pressure.   Eyes: Negative for pain, discharge and visual disturbance.  Respiratory: Negative for cough and chest tightness.   Gastrointestinal: Negative for nausea, abdominal pain and diarrhea.  Genitourinary: Negative for dysuria, urgency, frequency, flank pain, difficulty urinating, vaginal pain, menstrual problem, pelvic pain and dyspareunia.  Musculoskeletal: Positive for back pain (LS - severe). Negative for gait problem.  Skin: Negative for pallor and rash.  Neurological: Positive for headaches. Negative for dizziness, tremors, seizures, syncope, facial asymmetry, speech difficulty,  weakness, light-headedness and numbness.  Psychiatric/Behavioral: Negative for suicidal ideas, confusion, disturbed wake/sleep cycle and decreased concentration. The patient is not nervous/anxious.        Objective:   Physical Exam  Constitutional: She appears well-developed and well-nourished. No distress.  HENT:  Head: Normocephalic.  Right Ear: External ear normal.  Left Ear: External ear normal.  Nose: Nose normal.  Mouth/Throat: Oropharynx is clear and moist.  Eyes: Conjunctivae are normal. Pupils are equal, round, and reactive to light. Right eye exhibits no discharge.  Left eye exhibits no discharge.  Neck: Normal range of motion. Neck supple. No JVD present. No tracheal deviation present. No thyromegaly present.  Cardiovascular: Normal rate, regular rhythm and normal heart sounds.   Pulmonary/Chest: No stridor. No respiratory distress. She has no wheezes.  Abdominal: Soft. Bowel sounds are normal. She exhibits no distension and no mass. There is no tenderness. There is no rebound and no guarding.  Musculoskeletal: She exhibits tenderness. She exhibits no edema.       LS spine w/decr ROM LS, SI joints area is tender Str leg elev is neg B  Lymphadenopathy:    She has no cervical adenopathy.  Neurological: She displays normal reflexes. No cranial nerve deficit. She exhibits normal muscle tone. Coordination normal.  Skin: No rash noted. No erythema.  Psychiatric: She has a normal mood and affect. Her behavior is normal. Judgment and thought content normal.     Lab Results  Component Value Date   WBC 5.0 11/24/2011   HGB 11.9* 11/24/2011   HCT 36.7 11/24/2011   PLT 241.0 11/24/2011   GLUCOSE 95 07/10/2011   CHOL 175 07/10/2011   TRIG 67.0 07/10/2011   HDL 57.40 07/10/2011   LDLCALC 104* 07/10/2011   ALT 13 07/10/2011   AST 18 07/10/2011   NA 140 07/10/2011   K 4.1 07/10/2011   CL 104 07/10/2011   CREATININE 0.6 07/10/2011   BUN 16 07/10/2011   CO2 29 07/10/2011   TSH 2.83 11/24/2011   INR 0.9 02/24/2008        Assessment & Plan:

## 2012-06-08 NOTE — Assessment & Plan Note (Signed)
Neurol f/u onult Continue with current prescription therapy as reflected on the Med list.

## 2012-07-22 ENCOUNTER — Encounter: Payer: BC Managed Care – PPO | Admitting: Internal Medicine

## 2012-08-15 ENCOUNTER — Encounter: Payer: BC Managed Care – PPO | Admitting: Internal Medicine

## 2012-09-05 ENCOUNTER — Other Ambulatory Visit: Payer: Self-pay | Admitting: Obstetrics and Gynecology

## 2012-09-05 DIAGNOSIS — Z1231 Encounter for screening mammogram for malignant neoplasm of breast: Secondary | ICD-10-CM

## 2012-09-08 ENCOUNTER — Other Ambulatory Visit (HOSPITAL_COMMUNITY): Payer: Self-pay | Admitting: Neurology

## 2012-09-08 DIAGNOSIS — I729 Aneurysm of unspecified site: Secondary | ICD-10-CM

## 2012-09-08 DIAGNOSIS — Q054 Unspecified spina bifida with hydrocephalus: Secondary | ICD-10-CM

## 2012-09-08 DIAGNOSIS — R209 Unspecified disturbances of skin sensation: Secondary | ICD-10-CM

## 2012-09-08 DIAGNOSIS — R51 Headache: Secondary | ICD-10-CM

## 2012-09-13 ENCOUNTER — Ambulatory Visit (HOSPITAL_COMMUNITY): Admission: RE | Admit: 2012-09-13 | Payer: BC Managed Care – PPO | Source: Ambulatory Visit

## 2012-09-13 ENCOUNTER — Ambulatory Visit (HOSPITAL_COMMUNITY)
Admission: RE | Admit: 2012-09-13 | Discharge: 2012-09-13 | Disposition: A | Discharge status: Home / Self Care | Payer: BC Managed Care – PPO | Source: Ambulatory Visit | Attending: Neurology | Admitting: Neurology

## 2012-09-13 ENCOUNTER — Ambulatory Visit (HOSPITAL_COMMUNITY): Payer: BC Managed Care – PPO

## 2012-09-13 DIAGNOSIS — H538 Other visual disturbances: Secondary | ICD-10-CM | POA: Insufficient documentation

## 2012-09-13 DIAGNOSIS — R209 Unspecified disturbances of skin sensation: Secondary | ICD-10-CM | POA: Insufficient documentation

## 2012-09-13 DIAGNOSIS — R51 Headache: Secondary | ICD-10-CM | POA: Insufficient documentation

## 2012-09-13 DIAGNOSIS — I729 Aneurysm of unspecified site: Secondary | ICD-10-CM | POA: Insufficient documentation

## 2012-09-13 DIAGNOSIS — Q054 Unspecified spina bifida with hydrocephalus: Secondary | ICD-10-CM

## 2012-09-13 MED ORDER — GADOBENATE DIMEGLUMINE 529 MG/ML IV SOLN
12.0000 mL | Freq: Once | INTRAVENOUS | Status: AC | PRN
  Administered 2012-09-13: 12 mL via INTRAVENOUS

## 2012-09-14 ENCOUNTER — Other Ambulatory Visit (HOSPITAL_COMMUNITY): Payer: Self-pay | Admitting: Interventional Radiology

## 2012-09-14 DIAGNOSIS — I671 Cerebral aneurysm, nonruptured: Secondary | ICD-10-CM

## 2012-09-14 DIAGNOSIS — Q0700 Arnold-Chiari syndrome without spina bifida or hydrocephalus: Secondary | ICD-10-CM

## 2012-09-16 ENCOUNTER — Other Ambulatory Visit: Payer: Self-pay | Admitting: Radiology

## 2012-09-21 ENCOUNTER — Other Ambulatory Visit: Payer: Self-pay | Admitting: Radiology

## 2012-09-21 ENCOUNTER — Encounter (HOSPITAL_COMMUNITY): Payer: Self-pay | Admitting: Respiratory Therapy

## 2012-09-22 ENCOUNTER — Other Ambulatory Visit (HOSPITAL_COMMUNITY): Payer: Self-pay | Admitting: Interventional Radiology

## 2012-09-22 ENCOUNTER — Encounter (HOSPITAL_COMMUNITY): Payer: Self-pay

## 2012-09-22 ENCOUNTER — Ambulatory Visit (HOSPITAL_COMMUNITY)
Admission: RE | Admit: 2012-09-22 | Discharge: 2012-09-22 | Disposition: A | Discharge status: Home / Self Care | Payer: BC Managed Care – PPO | Source: Ambulatory Visit | Attending: Interventional Radiology | Admitting: Interventional Radiology

## 2012-09-22 DIAGNOSIS — R42 Dizziness and giddiness: Secondary | ICD-10-CM | POA: Insufficient documentation

## 2012-09-22 DIAGNOSIS — Z8673 Personal history of transient ischemic attack (TIA), and cerebral infarction without residual deficits: Secondary | ICD-10-CM | POA: Insufficient documentation

## 2012-09-22 DIAGNOSIS — Q0700 Arnold-Chiari syndrome without spina bifida or hydrocephalus: Secondary | ICD-10-CM

## 2012-09-22 DIAGNOSIS — R51 Headache: Secondary | ICD-10-CM | POA: Insufficient documentation

## 2012-09-22 DIAGNOSIS — I671 Cerebral aneurysm, nonruptured: Secondary | ICD-10-CM

## 2012-09-22 DIAGNOSIS — H538 Other visual disturbances: Secondary | ICD-10-CM | POA: Insufficient documentation

## 2012-09-22 DIAGNOSIS — Z7982 Long term (current) use of aspirin: Secondary | ICD-10-CM | POA: Insufficient documentation

## 2012-09-22 DIAGNOSIS — Z9889 Other specified postprocedural states: Secondary | ICD-10-CM | POA: Insufficient documentation

## 2012-09-22 LAB — PROTIME-INR
INR: 0.99 (ref 0.00–1.49)
Prothrombin Time: 13 seconds (ref 11.6–15.2)

## 2012-09-22 LAB — BASIC METABOLIC PANEL
Calcium: 9.3 mg/dL (ref 8.4–10.5)
Creatinine, Ser: 0.63 mg/dL (ref 0.50–1.10)
GFR calc Af Amer: >90 mL/min (ref >90)
Sodium: 141 mEq/L (ref 135–145)

## 2012-09-22 LAB — CBC WITH DIFFERENTIAL/PLATELET
Basophils Absolute: 0 10*3/uL (ref 0.0–0.1)
Lymphocytes Relative: 44 % (ref 12–46)
Neutro Abs: 1.4 10*3/uL — ABNORMAL LOW (ref 1.7–7.7)
Neutrophils Relative %: 41 % — ABNORMAL LOW (ref 43–77)
Platelets: 245 10*3/uL (ref 150–400)
RDW: 13.4 % (ref 11.5–15.5)
WBC: 3.4 10*3/uL — ABNORMAL LOW (ref 4.0–10.5)

## 2012-09-22 LAB — APTT: aPTT: 34 seconds (ref 24–37)

## 2012-09-22 MED ORDER — MIDAZOLAM HCL 2 MG/2ML IJ SOLN
INTRAMUSCULAR | Status: AC
  Filled 2012-09-22: qty 4

## 2012-09-22 MED ORDER — FENTANYL CITRATE 0.05 MG/ML IJ SOLN
INTRAMUSCULAR | Status: AC
  Filled 2012-09-22: qty 4

## 2012-09-22 MED ORDER — MIDAZOLAM HCL 2 MG/2ML IJ SOLN
INTRAMUSCULAR | Status: AC | PRN
  Administered 2012-09-22: 0.5 mg via INTRAVENOUS

## 2012-09-22 MED ORDER — HEPARIN SODIUM (PORCINE) 1000 UNIT/ML IJ SOLN
INTRAMUSCULAR | Status: AC | PRN
  Administered 2012-09-22: 500 [IU] via INTRAVENOUS

## 2012-09-22 MED ORDER — SODIUM CHLORIDE 0.9 % IV SOLN: INTRAVENOUS | Status: AC

## 2012-09-22 MED ORDER — FENTANYL CITRATE 0.05 MG/ML IJ SOLN
INTRAMUSCULAR | Status: AC | PRN
  Administered 2012-09-22: 12.5 ug via INTRAVENOUS

## 2012-09-22 MED ORDER — SODIUM CHLORIDE 0.9 % IV SOLN
Freq: Once | INTRAVENOUS | Status: AC
  Administered 2012-09-22: 1000 mL via INTRAVENOUS

## 2012-09-22 MED ORDER — IOHEXOL 300 MG/ML  SOLN: 150.0000 mL | Freq: Once | INTRAMUSCULAR | Status: AC | PRN

## 2012-09-22 NOTE — ED Notes (Signed)
Patient denies pain and is resting comfortably.  

## 2012-09-22 NOTE — Procedures (Signed)
S/P 4 vessel cerebral  arteriogram RT CFA approach. 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

## 2012-09-22 NOTE — H&P (Signed)
Linda Stewart is an 41 y.o. female.   Chief Complaint: Previous Left Internal Carotid aneurysm coil/stent 11/2006 New onset sxs last 2 months Occasional headache (3 or so in last 2 months) with blurred vision Has B hand and arm numbness and tingling with ha then resolves quickly MRI/MRA 09/13/12; possible enhancement at apex Scheduled for recheck cerebral arteriogram HPI: CVA; L ICA aneurysm - coil/stent; Migraines  Past Medical History  Diagnosis Date  . 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 Antithrombin 3 deficiency   . Migraine   . Ovarian cyst   . Carotid artery aneurysm     Past Surgical History  Procedure Date  . Carotid aneurysm s/p coil treatment     Currently OFF plavix  . Ovarian cyst surgery 2001  . Laparoscopy 2001    Family History  Problem Relation Age of Onset  . Leukemia Mother   . Cancer Mother 40    leukemia  . Lung cancer Father     SMOKER  . Cancer Father 38    lung ca  . Heart disease Other   . Stroke Other   . Alcohol abuse Other   . Breast cancer Other    Social History:  reports that she has never smoked. She does not have any smokeless tobacco history on file. She reports that she does not drink alcohol or use illicit drugs.  Allergies: No Known Allergies   (Not in a hospital admission)  Results for orders placed during the hospital encounter of 09/22/12 (from the past 48 hour(s))  CBC WITH DIFFERENTIAL     Status: Abnormal   Collection Time   09/22/12  9:06 AM      Component Value Range Comment   WBC 3.4 (*) 4.0 - 10.5 K/uL    RBC 4.75  3.87 - 5.11 MIL/uL    Hemoglobin 12.4  12.0 - 15.0 g/dL    HCT 16.1  09.6 - 04.5 %    MCV 79.4  78.0 - 100.0 fL    MCH 26.1  26.0 - 34.0 pg    MCHC 32.9  30.0 - 36.0 g/dL    RDW 40.9  81.1 - 91.4 %    Platelets 245  150 - 400 K/uL    Neutrophils Relative 41 (*) 43 - 77 %    Neutro  Abs 1.4 (*) 1.7 - 7.7 K/uL    Lymphocytes Relative 44  12 - 46 %    Lymphs Abs 1.5  0.7 - 4.0 K/uL    Monocytes Relative 8  3 - 12 %    Monocytes Absolute 0.3  0.1 - 1.0 K/uL    Eosinophils Relative 6 (*) 0 - 5 %    Eosinophils Absolute 0.2  0.0 - 0.7 K/uL    Basophils Relative 1  0 - 1 %    Basophils Absolute 0.0  0.0 - 0.1 K/uL    No results found.  Review of Systems  Constitutional: Negative for fever and weight loss.  Eyes: Positive for blurred vision.  Respiratory: Negative for shortness of breath.   Cardiovascular: Negative for chest pain.  Gastrointestinal: Negative for nausea, vomiting and abdominal pain.  Neurological: Positive for dizziness, tingling and headaches. Negative for weakness.    Blood pressure 98/60, pulse 56, temperature 98 F (36.7 C), temperature source Oral, resp. rate 18,  height 5\' 2"  (1.575 m), weight 137 lb (62.143 kg), last menstrual period 09/05/2012, SpO2 100.00%. Physical Exam  Constitutional: She is oriented to person, place, and time. She appears well-developed and well-nourished.  Eyes:       Pt has noticed new onset occas headaches; 3 in 2 months Has some blurred vision; hands and arms numbness and tingling with these headaches. No N/V  Cardiovascular: Normal rate, regular rhythm and normal heart sounds.   Respiratory: Effort normal and breath sounds normal.  GI: Soft. Bowel sounds are normal. There is no tenderness.  Musculoskeletal: Normal range of motion.  Neurological: She is alert and oriented to person, place, and time.  Psychiatric: She has a normal mood and affect. Her behavior is normal. Judgment and thought content normal.     Assessment/Plan Previous L ICA aneurysm coil/stent 2008 New onset occas headaches and blurred vision x 2 months Pt now scheduled for recheck cerebral arteriogram Pt aware of procedure benefits and risks and agreeable to proceed Consent signed and in chart  Rooney Gladwin A 09/22/2012, 9:52 AM

## 2012-09-26 ENCOUNTER — Other Ambulatory Visit: Payer: Self-pay | Admitting: *Deleted

## 2012-09-26 ENCOUNTER — Other Ambulatory Visit (INDEPENDENT_AMBULATORY_CARE_PROVIDER_SITE_OTHER): Payer: BC Managed Care – PPO

## 2012-09-26 DIAGNOSIS — Z0000 Encounter for general adult medical examination without abnormal findings: Secondary | ICD-10-CM

## 2012-09-26 LAB — HEPATIC FUNCTION PANEL
AST: 16 U/L (ref 0–37)
Albumin: 4.1 g/dL (ref 3.5–5.2)
Alkaline Phosphatase: 43 U/L (ref 39–117)
Total Protein: 7.2 g/dL (ref 6.0–8.3)

## 2012-09-26 LAB — BASIC METABOLIC PANEL
CO2: 25 mEq/L (ref 19–32)
Glucose, Bld: 93 mg/dL (ref 70–99)
Potassium: 4 mEq/L (ref 3.5–5.1)
Sodium: 137 mEq/L (ref 135–145)

## 2012-09-26 LAB — URINALYSIS, ROUTINE W REFLEX MICROSCOPIC
Bilirubin Urine: NEGATIVE
Ketones, ur: NEGATIVE
Leukocytes, UA: NEGATIVE
Urobilinogen, UA: 0.2 (ref 0.0–1.0)

## 2012-09-26 LAB — CBC WITH DIFFERENTIAL/PLATELET
Basophils Absolute: 0 10*3/uL (ref 0.0–0.1)
Eosinophils Absolute: 0.1 10*3/uL (ref 0.0–0.7)
Hemoglobin: 11.9 g/dL — ABNORMAL LOW (ref 12.0–15.0)
Lymphocytes Relative: 37.5 % (ref 12.0–46.0)
Lymphs Abs: 1.3 10*3/uL (ref 0.7–4.0)
MCHC: 33 g/dL (ref 30.0–36.0)
Neutro Abs: 1.7 10*3/uL (ref 1.4–7.7)
Platelets: 210 10*3/uL (ref 150.0–400.0)
RDW: 13.5 % (ref 11.5–14.6)

## 2012-09-26 LAB — LIPID PANEL
Cholesterol: 166 mg/dL (ref 0–200)
HDL: 54.4 mg/dL (ref >39.00)

## 2012-10-04 ENCOUNTER — Encounter: Payer: Self-pay | Admitting: Internal Medicine

## 2012-10-04 ENCOUNTER — Ambulatory Visit (INDEPENDENT_AMBULATORY_CARE_PROVIDER_SITE_OTHER): Payer: BC Managed Care – PPO | Admitting: Internal Medicine

## 2012-10-04 VITALS — BP 104/78 | HR 64 | Temp 98.9°F | Ht 62.0 in | Wt 137.1 lb

## 2012-10-04 DIAGNOSIS — Z23 Encounter for immunization: Secondary | ICD-10-CM

## 2012-10-04 DIAGNOSIS — D689 Coagulation defect, unspecified: Secondary | ICD-10-CM

## 2012-10-04 DIAGNOSIS — R51 Headache: Secondary | ICD-10-CM

## 2012-10-04 DIAGNOSIS — R209 Unspecified disturbances of skin sensation: Secondary | ICD-10-CM

## 2012-10-04 DIAGNOSIS — Z0000 Encounter for general adult medical examination without abnormal findings: Secondary | ICD-10-CM

## 2012-10-04 DIAGNOSIS — E538 Deficiency of other specified B group vitamins: Secondary | ICD-10-CM

## 2012-10-04 DIAGNOSIS — R202 Paresthesia of skin: Secondary | ICD-10-CM

## 2012-10-04 NOTE — Assessment & Plan Note (Signed)
On ASA 

## 2012-10-04 NOTE — Assessment & Plan Note (Signed)
Cont w/ASA 

## 2012-10-04 NOTE — Assessment & Plan Note (Signed)
We discussed age appropriate health related issues, including available/recomended screening tests and vaccinations. We discussed a need for adhering to healthy diet and exercise. Labs/EKG were reviewed/ordered. All questions were answered. PAP is pending

## 2012-10-04 NOTE — Progress Notes (Signed)
   Subjective:     HPI  The patient is here for a wellness exam. The patient has been doing well overall without major physical or psychological issues going on lately.   Review of Systems  Constitutional: Negative for chills, activity change, appetite change, fatigue and unexpected weight change.  HENT: Negative for congestion, sore throat, mouth sores and sinus pressure.   Eyes: Negative for pain, discharge and visual disturbance.  Respiratory: Negative for cough and chest tightness.   Gastrointestinal: Negative for nausea, abdominal pain and diarrhea.  Genitourinary: Negative for dysuria, urgency, frequency, flank pain, difficulty urinating, vaginal pain, menstrual problem, pelvic pain and dyspareunia.  Musculoskeletal: Negative for back pain and gait problem.  Skin: Negative for pallor and rash.  Neurological: Negative for dizziness, tremors, seizures, syncope, facial asymmetry, speech difficulty, weakness, light-headedness, numbness and headaches.  Psychiatric/Behavioral: Negative for suicidal ideas, confusion, sleep disturbance and decreased concentration. The patient is not nervous/anxious.        Objective:   Physical Exam  Constitutional: She appears well-developed and well-nourished. No distress.  HENT:  Head: Normocephalic.  Right Ear: External ear normal.  Left Ear: External ear normal.  Nose: Nose normal.  Mouth/Throat: Oropharynx is clear and moist.  Eyes: Conjunctivae normal are normal. Pupils are equal, round, and reactive to light. Right eye exhibits no discharge. Left eye exhibits no discharge.  Neck: Normal range of motion. Neck supple. No JVD present. No tracheal deviation present. No thyromegaly present.  Cardiovascular: Normal rate, regular rhythm and normal heart sounds.   Pulmonary/Chest: No stridor. No respiratory distress. She has no wheezes.  Abdominal: Soft. Bowel sounds are normal. She exhibits no distension and no mass. There is no tenderness. There  is no rebound and no guarding.  Musculoskeletal: She exhibits no edema and no tenderness.  Lymphadenopathy:    She has no cervical adenopathy.  Neurological: She displays normal reflexes. No cranial nerve deficit. She exhibits normal muscle tone. Coordination normal.  Skin: No rash noted. No erythema.  Psychiatric: She has a normal mood and affect. Her behavior is normal. Judgment and thought content normal.   Moles on neck  Lab Results  Component Value Date   WBC 3.4* 09/26/2012   HGB 11.9* 09/26/2012   HCT 36.0 09/26/2012   PLT 210.0 09/26/2012   GLUCOSE 93 09/26/2012   CHOL 166 09/26/2012   TRIG 47.0 09/26/2012   HDL 54.40 09/26/2012   LDLCALC 102* 09/26/2012   ALT 12 09/26/2012   AST 16 09/26/2012   NA 137 09/26/2012   K 4.0 09/26/2012   CL 105 09/26/2012   CREATININE 0.7 09/26/2012   BUN 15 09/26/2012   CO2 25 09/26/2012   TSH 3.71 09/26/2012   INR 0.99 09/22/2012        Assessment & Plan:

## 2012-10-04 NOTE — Assessment & Plan Note (Signed)
Continue with current prescription therapy as reflected on the Med list.  

## 2012-10-04 NOTE — Assessment & Plan Note (Signed)
Migraines w/aura 2012 neg MRI/angio

## 2012-10-06 ENCOUNTER — Ambulatory Visit
Admission: RE | Admit: 2012-10-06 | Discharge: 2012-10-06 | Disposition: A | Discharge status: Home / Self Care | Payer: BC Managed Care – PPO | Source: Ambulatory Visit | Attending: Obstetrics and Gynecology | Admitting: Obstetrics and Gynecology

## 2012-10-06 DIAGNOSIS — Z1231 Encounter for screening mammogram for malignant neoplasm of breast: Secondary | ICD-10-CM

## 2013-01-30 ENCOUNTER — Ambulatory Visit: Payer: Self-pay | Admitting: Nurse Practitioner

## 2013-03-29 ENCOUNTER — Other Ambulatory Visit: Payer: Self-pay | Admitting: Chiropractic Medicine

## 2013-03-29 ENCOUNTER — Ambulatory Visit
Admission: RE | Admit: 2013-03-29 | Discharge: 2013-03-29 | Disposition: A | Discharge status: Home / Self Care | Payer: BC Managed Care – PPO | Source: Ambulatory Visit | Attending: Chiropractic Medicine | Admitting: Chiropractic Medicine

## 2013-03-29 DIAGNOSIS — M542 Cervicalgia: Secondary | ICD-10-CM

## 2013-03-29 DIAGNOSIS — M549 Dorsalgia, unspecified: Secondary | ICD-10-CM

## 2013-09-20 ENCOUNTER — Telehealth (HOSPITAL_COMMUNITY): Payer: Self-pay | Admitting: Interventional Radiology

## 2013-09-20 NOTE — Telephone Encounter (Signed)
Called pt left VM for her to call me back. Told her it was time for her 1 yr f/u MRI/MRA brain. JM

## 2013-10-04 ENCOUNTER — Ambulatory Visit (INDEPENDENT_AMBULATORY_CARE_PROVIDER_SITE_OTHER): Payer: BC Managed Care – PPO | Admitting: Emergency Medicine

## 2013-10-04 VITALS — BP 100/60 | HR 79 | Temp 98.6°F | Resp 16 | Ht 63.0 in | Wt 136.0 lb

## 2013-10-04 DIAGNOSIS — R1032 Left lower quadrant pain: Secondary | ICD-10-CM

## 2013-10-04 LAB — POCT URINALYSIS DIPSTICK
Glucose, UA: NEGATIVE
Ketones, UA: NEGATIVE
Spec Grav, UA: <=1.005
Urobilinogen, UA: 0.2

## 2013-10-04 LAB — POCT CBC
Hemoglobin: 12 g/dL — AB (ref 12.2–16.2)
MCH, POC: 25.3 pg — AB (ref 27–31.2)
MPV: 10.4 fL (ref 0–99.8)
POC MID %: 7.9 %M (ref 0–12)
RBC: 4.74 M/uL (ref 4.04–5.48)
WBC: 5.5 10*3/uL (ref 4.6–10.2)

## 2013-10-04 LAB — POCT WET PREP WITH KOH
KOH Prep POC: NEGATIVE
RBC Wet Prep HPF POC: NEGATIVE
Trichomonas, UA: NEGATIVE
Yeast Wet Prep HPF POC: NEGATIVE

## 2013-10-04 LAB — POCT UA - MICROSCOPIC ONLY
Crystals, Ur, HPF, POC: NEGATIVE
Mucus, UA: NEGATIVE
Yeast, UA: NEGATIVE

## 2013-10-04 NOTE — Progress Notes (Signed)
Subjective:    Patient ID: Linda Stewart, female    DOB: Jun 17, 1971, 42 y.o.   MRN: [id number]  HPI Scribed for Lesle Chris MD, the patient was seen in room 3. This chart was scribed by Lewanda Rife, ED scribe. Patient's care was started at 4:33 PM  HPI Comments: Hx was provided by the pt.  Linda Stewart is a 42 y.o. female who presents to the [facility] complaining of waxing and waning moderate lower abdominal pain onset yesterday. Describes pain as bloating sensation and alternating between left and right lower quadrants. Reports associated constipation, and mild dysuria. Reports symptoms are exacerbated when needing to void and alleviated mildly when voiding. Denies associated vaginal discharge, fever, hematuria, and urinary urgency. Denies taking birth control.   LMP 09/18/13   Past Medical History  Diagnosis Date  . 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 Antithrombin 3 deficiency   . Migraine   . Ovarian cyst   . Carotid artery aneurysm     Past Surgical History  Procedure Laterality Date  . Carotid aneurysm s/p coil treatment      Currently OFF plavix  . Ovarian cyst surgery  2001  . Laparoscopy  2001    Family History  Problem Relation Age of Onset  . Leukemia Mother   . Cancer Mother 40    leukemia  . Lung cancer Father     SMOKER  . Cancer Father 62    lung ca  . Heart disease Other   . Stroke Other   . Alcohol abuse Other   . Breast cancer Other     History   Social History  . Marital Status: Married    Spouse Name: N/A    Number of Children: 2  . Years of Education: N/A   Occupational History  . Secretary [employer]   Social History Main Topics  . Smoking status: Never Smoker   . Smokeless tobacco: Not on file  . Alcohol Use: No  . Drug Use: No  . Sexual Activity: Yes   Other  Topics Concern  . Not on file   Social History Narrative   Immigrated to [place] 1999 from [place]          No Known Allergies  Patient Active Problem List   Diagnosis Date Noted  . Paresthesia 11/25/2011  . B12 deficiency 11/25/2011  . Well adult exam 07/17/2011  . Neoplasm of uncertain behavior of skin 07/17/2011  . CONJUNCTIVITIS, ACUTE 08/06/2010  . PNEUMONIA 07/03/2010  . Headache 06/04/2010  . TORTICOLLIS, SPASMODIC 04/02/2010  . NECK PAIN, LEFT 04/02/2010  . FATIGUE 06/03/2009  . BACK PAIN 03/08/2009  . 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  . CEREBROVASCULAR ACCIDENT, HX OF 06/03/2007       Review of Systems  Constitutional: Negative for fever.  Gastrointestinal: Positive for abdominal pain.  Genitourinary: Positive for dysuria and urgency. Negative for hematuria and vaginal discharge.       Objective:   Physical Exam  Physical Exam  Nursing note and vitals reviewed. Constitutional: She is oriented to person, place, and time. She appears well-developed and well-nourished. No distress.  HENT:  Head: Normocephalic and atraumatic.  Eyes: EOM are normal.  Neck: Neck supple. No tracheal deviation present.  Cardiovascular: Normal rate.   Pulmonary/Chest: Effort normal. No  respiratory distress. No CVA tenderness.   Abdominal: Bowel sounds present. TTP of LLQ with guarding. No rebound. No peritoneal signs. Musculoskeletal: Normal range of motion.  Neurological: She is alert and oriented to person, place, and time.  Skin: Skin is warm and dry.  Psychiatric: She has a normal mood and affect. Her behavior is normal.   COORDINATION OF CARE:  Nursing notes reviewed. Vital signs reviewed. Initial pt interview and examination performed.   4:38 PM-Discussed work up plan with pt at bedside, which includes CBC with diff panel, and UA. Pt agrees with plan.   Treatment plan initiated:Medications - No data to display   Initial diagnostic testing ordered.   Results for  orders placed in visit on 10/04/13  POCT CBC      Result Value Range   WBC 5.5  4.6 - 10.2 K/uL   Lymph, poc 1.7  0.6 - 3.4   POC LYMPH PERCENT 30.2  10 - 50 %L   MID (cbc) 0.4  0 - 0.9   POC MID % 7.9  0 - 12 %M   POC Granulocyte 3.4  2 - 6.9   Granulocyte percent 61.9  37 - 80 %G   RBC 4.74  4.04 - 5.48 M/uL   Hemoglobin 12.0 (*) 12.2 - 16.2 g/dL   HCT, POC 40.9  81.1 - 47.9 %   MCV 83.5  80 - 97 fL   MCH, POC 25.3 (*) 27 - 31.2 pg   MCHC 30.3 (*) 31.8 - 35.4 g/dL   RDW, POC 91.4     Platelet Count, POC 228  142 - 424 K/uL   MPV 10.4  0 - 99.8 fL  POCT WET PREP WITH KOH      Result Value Range   Trichomonas, UA Negative     Clue Cells Wet Prep HPF POC 0-2     Epithelial Wet Prep HPF POC 10-24     Yeast Wet Prep HPF POC neg     Bacteria Wet Prep HPF POC 3+     RBC Wet Prep HPF POC neg     WBC Wet Prep HPF POC 1-10     KOH Prep POC Negative         Assessment & Plan:  Unclear as to the etiology of her pain. Cultures were done. Will check CT in the morning . I personally performed the services described in this documentation, which was scribed in my presence. The recorded information has been reviewed and is accurate.

## 2013-10-04 NOTE — Progress Notes (Signed)
Pelvic exam - speculum exam pt had no TTP - cervix without irritation no discharge, no CMT.  She has no TTP on bimanual exam on TTP with abd palpation on the left side

## 2013-10-05 ENCOUNTER — Ambulatory Visit (HOSPITAL_COMMUNITY)
Admission: RE | Admit: 2013-10-05 | Discharge: 2013-10-05 | Disposition: A | Discharge status: Home / Self Care | Payer: BC Managed Care – PPO | Source: Ambulatory Visit | Attending: Emergency Medicine | Admitting: Emergency Medicine

## 2013-10-05 ENCOUNTER — Encounter (HOSPITAL_COMMUNITY): Payer: Self-pay

## 2013-10-05 ENCOUNTER — Ambulatory Visit (HOSPITAL_COMMUNITY)
Admission: EM | Admit: 2013-10-05 | Discharge: 2013-10-05 | Disposition: A | Discharge status: Home / Self Care | Payer: BC Managed Care – PPO | Source: Ambulatory Visit | Attending: Emergency Medicine | Admitting: Emergency Medicine

## 2013-10-05 ENCOUNTER — Telehealth: Payer: Self-pay | Admitting: Radiology

## 2013-10-05 ENCOUNTER — Other Ambulatory Visit: Payer: Self-pay | Admitting: Emergency Medicine

## 2013-10-05 DIAGNOSIS — R1032 Left lower quadrant pain: Secondary | ICD-10-CM

## 2013-10-05 LAB — GC/CHLAMYDIA PROBE AMP
CT Probe RNA: NEGATIVE
GC Probe RNA: NEGATIVE

## 2013-10-05 MED ORDER — IOHEXOL 300 MG/ML  SOLN
100.0000 mL | Freq: Once | INTRAMUSCULAR | Status: AC | PRN
  Administered 2013-10-05: 100 mL via INTRAVENOUS

## 2013-10-05 NOTE — Telephone Encounter (Signed)
Patient advised to go for the scan today, she is instructed to go for the scan, not to eat, she can have clear liquids if needed. She was told to go now, and to register in the ER for this, but to let them know she will not go through the ER, she is there for scan only.

## 2013-10-05 NOTE — Telephone Encounter (Signed)
CT scan authorized  Order #[id number] patient can go to [place] for the scan, today now called left message for her to call me back so I can advise.

## 2013-10-06 LAB — URINE CULTURE
Colony Count: NO GROWTH
Organism ID, Bacteria: NO GROWTH

## 2013-10-07 ENCOUNTER — Telehealth: Payer: Self-pay | Admitting: Emergency Medicine

## 2013-10-07 NOTE — Telephone Encounter (Signed)
Please call patient check on status make sure she has been advised to followup with a GYN specialist. We can make referral if she desires this to

## 2013-10-09 ENCOUNTER — Ambulatory Visit: Payer: BC Managed Care – PPO | Admitting: Internal Medicine

## 2013-10-09 NOTE — Telephone Encounter (Signed)
Patient was called by xray advised to recheck here or follow up with Gyn if still painful. Left message for her to call me back, so I can see how she is feeling.

## 2013-10-11 NOTE — Telephone Encounter (Signed)
Have left another message, sent unable to reach letter.

## 2013-10-14 ENCOUNTER — Telehealth: Payer: Self-pay

## 2013-10-14 NOTE — Telephone Encounter (Signed)
Patient called regarding her CT scan results. She received a letter regarding the results and states she is confused by the letter. She states she spoke with a doctor and understood that the results were normal.  Please return her call at [phone]. Thank you.

## 2013-10-15 NOTE — Telephone Encounter (Signed)
Spoke with pt and advised her that her study showed a possible ovarian cyst that may have ruptured. Pt states she is doing better and she does have an appointment with her GYN next week.

## 2013-10-17 ENCOUNTER — Other Ambulatory Visit (INDEPENDENT_AMBULATORY_CARE_PROVIDER_SITE_OTHER): Payer: BC Managed Care – PPO

## 2013-10-17 DIAGNOSIS — D689 Coagulation defect, unspecified: Secondary | ICD-10-CM

## 2013-10-17 DIAGNOSIS — R209 Unspecified disturbances of skin sensation: Secondary | ICD-10-CM

## 2013-10-17 DIAGNOSIS — R202 Paresthesia of skin: Secondary | ICD-10-CM

## 2013-10-17 DIAGNOSIS — R51 Headache: Secondary | ICD-10-CM

## 2013-10-17 DIAGNOSIS — E538 Deficiency of other specified B group vitamins: Secondary | ICD-10-CM

## 2013-10-17 DIAGNOSIS — Z0000 Encounter for general adult medical examination without abnormal findings: Secondary | ICD-10-CM

## 2013-10-17 LAB — CBC WITH DIFFERENTIAL/PLATELET
BASOS PCT: 0.9 % (ref 0.0–3.0)
Basophils Absolute: 0 10*3/uL (ref 0.0–0.1)
Eosinophils Absolute: 0.1 10*3/uL (ref 0.0–0.7)
Eosinophils Relative: 4.5 % (ref 0.0–5.0)
HCT: 36.2 % (ref 36.0–46.0)
HEMOGLOBIN: 12 g/dL (ref 12.0–15.0)
LYMPHS PCT: 39.9 % (ref 12.0–46.0)
Lymphs Abs: 1.2 10*3/uL (ref 0.7–4.0)
MCHC: 33.2 g/dL (ref 30.0–36.0)
MCV: 77.4 fl — ABNORMAL LOW (ref 78.0–100.0)
Monocytes Absolute: 0.3 10*3/uL (ref 0.1–1.0)
Monocytes Relative: 11.3 % (ref 3.0–12.0)
NEUTROS ABS: 1.3 10*3/uL — AB (ref 1.4–7.7)
Neutrophils Relative %: 43.4 % (ref 43.0–77.0)
Platelets: 268 10*3/uL (ref 150.0–400.0)
RBC: 4.68 Mil/uL (ref 3.87–5.11)
RDW: 14.1 % (ref 11.5–14.6)
WBC: 2.9 10*3/uL — ABNORMAL LOW (ref 4.5–10.5)

## 2013-10-17 LAB — URINALYSIS, ROUTINE W REFLEX MICROSCOPIC
Bilirubin Urine: NEGATIVE
Ketones, ur: NEGATIVE
Leukocytes, UA: NEGATIVE
NITRITE: NEGATIVE
PH: 6 (ref 5.0–8.0)
Total Protein, Urine: NEGATIVE
UROBILINOGEN UA: 0.2 (ref 0.0–1.0)
Urine Glucose: NEGATIVE

## 2013-10-17 LAB — HEPATIC FUNCTION PANEL
ALK PHOS: 54 U/L (ref 39–117)
ALT: 17 U/L (ref 0–35)
AST: 18 U/L (ref 0–37)
Albumin: 3.9 g/dL (ref 3.5–5.2)
BILIRUBIN DIRECT: 0.1 mg/dL (ref 0.0–0.3)
Total Bilirubin: 0.5 mg/dL (ref 0.3–1.2)
Total Protein: 7.4 g/dL (ref 6.0–8.3)

## 2013-10-17 LAB — IBC PANEL
Iron: 56 ug/dL (ref 42–145)
SATURATION RATIOS: 13.5 % — AB (ref 20.0–50.0)
Transferrin: 296.5 mg/dL (ref 212.0–360.0)

## 2013-10-17 LAB — BASIC METABOLIC PANEL
BUN: 14 mg/dL (ref 6–23)
CALCIUM: 9.3 mg/dL (ref 8.4–10.5)
CO2: 27 mEq/L (ref 19–32)
Chloride: 106 mEq/L (ref 96–112)
Creatinine, Ser: 0.6 mg/dL (ref 0.4–1.2)
GFR: 121.66 mL/min (ref >60.00)
Glucose, Bld: 84 mg/dL (ref 70–99)
Potassium: 4.3 mEq/L (ref 3.5–5.1)
SODIUM: 140 meq/L (ref 135–145)

## 2013-10-17 LAB — TSH: TSH: 0.01 u[IU]/mL — ABNORMAL LOW (ref 0.35–5.50)

## 2013-10-17 LAB — VITAMIN B12: Vitamin B-12: 1042 pg/mL — ABNORMAL HIGH (ref 211–911)

## 2013-10-17 LAB — LIPID PANEL
CHOL/HDL RATIO: 3
Cholesterol: 164 mg/dL (ref 0–200)
HDL: 51 mg/dL (ref >39.00)
LDL CALC: 101 mg/dL — AB (ref 0–99)
Triglycerides: 58 mg/dL (ref 0.0–149.0)
VLDL: 11.6 mg/dL (ref 0.0–40.0)

## 2013-10-18 ENCOUNTER — Ambulatory Visit (INDEPENDENT_AMBULATORY_CARE_PROVIDER_SITE_OTHER): Payer: BC Managed Care – PPO | Admitting: Internal Medicine

## 2013-10-18 ENCOUNTER — Encounter: Payer: Self-pay | Admitting: Internal Medicine

## 2013-10-18 VITALS — BP 118/78 | HR 80 | Temp 99.3°F | Resp 16 | Ht 63.0 in | Wt 136.0 lb

## 2013-10-18 DIAGNOSIS — E538 Deficiency of other specified B group vitamins: Secondary | ICD-10-CM

## 2013-10-18 DIAGNOSIS — R946 Abnormal results of thyroid function studies: Secondary | ICD-10-CM

## 2013-10-18 DIAGNOSIS — R1032 Left lower quadrant pain: Secondary | ICD-10-CM

## 2013-10-18 DIAGNOSIS — Z0000 Encounter for general adult medical examination without abnormal findings: Secondary | ICD-10-CM

## 2013-10-18 DIAGNOSIS — D689 Coagulation defect, unspecified: Secondary | ICD-10-CM

## 2013-10-18 DIAGNOSIS — R319 Hematuria, unspecified: Secondary | ICD-10-CM | POA: Insufficient documentation

## 2013-10-18 DIAGNOSIS — E0500 Thyrotoxicosis with diffuse goiter without thyrotoxic crisis or storm: Secondary | ICD-10-CM | POA: Insufficient documentation

## 2013-10-18 DIAGNOSIS — R7989 Other specified abnormal findings of blood chemistry: Secondary | ICD-10-CM

## 2013-10-18 DIAGNOSIS — R51 Headache: Secondary | ICD-10-CM

## 2013-10-18 LAB — VITAMIN D 25 HYDROXY (VIT D DEFICIENCY, FRACTURES): VIT D 25 HYDROXY: 38 ng/mL (ref 30–89)

## 2013-10-18 MED ORDER — DOXYCYCLINE HYCLATE 100 MG PO TABS: 100.0000 mg | ORAL_TABLET | Freq: Two times a day (BID) | ORAL | Status: DC

## 2013-10-18 NOTE — Assessment & Plan Note (Signed)
labs

## 2013-10-18 NOTE — Assessment & Plan Note (Signed)
Continue with current prescription therapy as reflected on the Med list.  

## 2013-10-18 NOTE — Assessment & Plan Note (Signed)
Migraines w/aura 2012 neg MRI/angio Infrequent

## 2013-10-18 NOTE — Assessment & Plan Note (Signed)
We discussed age appropriate health related issues, including available/recomended screening tests and vaccinations. We discussed a need for adhering to healthy diet and exercise. Labs/EKG were reviewed/ordered. All questions were answered.   

## 2013-10-18 NOTE — Progress Notes (Signed)
Pre visit review using our clinic review tool, if applicable. No additional management support is needed unless otherwise documented below in the visit note. 

## 2013-10-18 NOTE — Assessment & Plan Note (Signed)
1/15 low TSH

## 2013-10-19 ENCOUNTER — Encounter: Payer: Self-pay | Admitting: Internal Medicine

## 2013-10-19 DIAGNOSIS — R1032 Left lower quadrant pain: Secondary | ICD-10-CM | POA: Insufficient documentation

## 2013-10-19 NOTE — Progress Notes (Signed)
   Subjective:     HPI  The patient is here for a wellness exam. The patient has been doing well overall without major physical or psychological issues going on lately, except for recent episode of LLQ abd pain   Review of Systems  Constitutional: Negative for chills, activity change, appetite change, fatigue and unexpected weight change.  HENT: Negative for congestion, mouth sores, sinus pressure and sore throat.   Eyes: Negative for pain, discharge and visual disturbance.  Respiratory: Negative for cough and chest tightness.   Gastrointestinal: Negative for nausea, abdominal pain and diarrhea.  Genitourinary: Negative for dysuria, urgency, frequency, flank pain, difficulty urinating, vaginal pain, menstrual problem, pelvic pain and dyspareunia.  Musculoskeletal: Negative for back pain and gait problem.  Skin: Negative for pallor and rash.  Neurological: Negative for dizziness, tremors, seizures, syncope, facial asymmetry, speech difficulty, weakness, light-headedness, numbness and headaches.  Psychiatric/Behavioral: Negative for suicidal ideas, confusion, sleep disturbance and decreased concentration. The patient is not nervous/anxious.        Objective:   Physical Exam  Constitutional: She appears well-developed and well-nourished. No distress.  HENT:  Head: Normocephalic.  Right Ear: External ear normal.  Left Ear: External ear normal.  Nose: Nose normal.  Mouth/Throat: Oropharynx is clear and moist.  Eyes: Conjunctivae are normal. Pupils are equal, round, and reactive to light. Right eye exhibits no discharge. Left eye exhibits no discharge.  Neck: Normal range of motion. Neck supple. No JVD present. No tracheal deviation present. No thyromegaly present.  Cardiovascular: Normal rate, regular rhythm and normal heart sounds.   Pulmonary/Chest: No stridor. No respiratory distress. She has no wheezes.  Abdominal: Soft. Bowel sounds are normal. She exhibits no distension and no  mass. There is no tenderness. There is no rebound and no guarding.  Musculoskeletal: She exhibits no edema and no tenderness.  Lymphadenopathy:    She has no cervical adenopathy.  Neurological: She displays normal reflexes. No cranial nerve deficit. She exhibits normal muscle tone. Coordination normal.  Skin: No rash noted. No erythema.  Psychiatric: She has a normal mood and affect. Her behavior is normal. Judgment and thought content normal.     Lab Results  Component Value Date   WBC 2.9* 10/17/2013   HGB 12.0 10/17/2013   HCT 36.2 10/17/2013   PLT 268.0 10/17/2013   GLUCOSE 84 10/17/2013   CHOL 164 10/17/2013   TRIG 58.0 10/17/2013   HDL 51.00 10/17/2013   LDLCALC 101* 10/17/2013   ALT 17 10/17/2013   AST 18 10/17/2013   NA 140 10/17/2013   K 4.3 10/17/2013   CL 106 10/17/2013   CREATININE 0.6 10/17/2013   BUN 14 10/17/2013   CO2 27 10/17/2013   TSH 0.01* 10/17/2013   INR 0.99 09/22/2012        Assessment & Plan:

## 2013-10-19 NOTE — Assessment & Plan Note (Signed)
1/15 possible ruptured ovarian cyst -- CT Discussed

## 2013-10-19 NOTE — Assessment & Plan Note (Signed)
On ASA 

## 2013-11-16 ENCOUNTER — Encounter: Payer: Self-pay | Admitting: Internal Medicine

## 2013-11-23 ENCOUNTER — Other Ambulatory Visit (INDEPENDENT_AMBULATORY_CARE_PROVIDER_SITE_OTHER): Payer: BC Managed Care – PPO

## 2013-11-23 DIAGNOSIS — E538 Deficiency of other specified B group vitamins: Secondary | ICD-10-CM

## 2013-11-23 DIAGNOSIS — R946 Abnormal results of thyroid function studies: Secondary | ICD-10-CM

## 2013-11-23 DIAGNOSIS — R319 Hematuria, unspecified: Secondary | ICD-10-CM

## 2013-11-23 DIAGNOSIS — R7989 Other specified abnormal findings of blood chemistry: Secondary | ICD-10-CM

## 2013-11-23 LAB — URINALYSIS
Bilirubin Urine: NEGATIVE
Hgb urine dipstick: NEGATIVE
Ketones, ur: NEGATIVE
LEUKOCYTES UA: NEGATIVE
NITRITE: NEGATIVE
PH: 6 (ref 5.0–8.0)
Specific Gravity, Urine: 1.02 (ref 1.000–1.030)
TOTAL PROTEIN, URINE-UPE24: NEGATIVE
Urine Glucose: NEGATIVE
Urobilinogen, UA: 0.2 (ref 0.0–1.0)

## 2013-11-23 LAB — CBC WITH DIFFERENTIAL/PLATELET
BASOS PCT: 0.6 % (ref 0.0–3.0)
Basophils Absolute: 0 10*3/uL (ref 0.0–0.1)
Eosinophils Absolute: 0.1 10*3/uL (ref 0.0–0.7)
Eosinophils Relative: 2.4 % (ref 0.0–5.0)
HCT: 37.5 % (ref 36.0–46.0)
Hemoglobin: 12.1 g/dL (ref 12.0–15.0)
LYMPHS PCT: 29.1 % (ref 12.0–46.0)
Lymphs Abs: 1.3 10*3/uL (ref 0.7–4.0)
MCHC: 32.3 g/dL (ref 30.0–36.0)
MCV: 79.9 fl (ref 78.0–100.0)
MONOS PCT: 8.5 % (ref 3.0–12.0)
Monocytes Absolute: 0.4 10*3/uL (ref 0.1–1.0)
NEUTROS ABS: 2.7 10*3/uL (ref 1.4–7.7)
Neutrophils Relative %: 59.4 % (ref 43.0–77.0)
Platelets: 324 10*3/uL (ref 150.0–400.0)
RBC: 4.69 Mil/uL (ref 3.87–5.11)
RDW: 13.8 % (ref 11.5–14.6)
WBC: 4.5 10*3/uL (ref 4.5–10.5)

## 2013-11-23 LAB — TSH: TSH: 0.02 u[IU]/mL — ABNORMAL LOW (ref 0.35–5.50)

## 2013-11-23 LAB — T4, FREE: FREE T4: 1.67 ng/dL — AB (ref 0.60–1.60)

## 2013-11-27 ENCOUNTER — Other Ambulatory Visit: Payer: Self-pay | Admitting: Internal Medicine

## 2013-11-27 DIAGNOSIS — R7989 Other specified abnormal findings of blood chemistry: Secondary | ICD-10-CM

## 2013-12-06 ENCOUNTER — Encounter: Payer: Self-pay | Admitting: Internal Medicine

## 2013-12-06 ENCOUNTER — Ambulatory Visit (INDEPENDENT_AMBULATORY_CARE_PROVIDER_SITE_OTHER): Payer: BC Managed Care – PPO | Admitting: Internal Medicine

## 2013-12-06 VITALS — BP 120/62 | HR 76 | Temp 98.3°F | Resp 16 | Wt 137.0 lb

## 2013-12-06 DIAGNOSIS — R946 Abnormal results of thyroid function studies: Secondary | ICD-10-CM

## 2013-12-06 DIAGNOSIS — K6289 Other specified diseases of anus and rectum: Secondary | ICD-10-CM

## 2013-12-06 DIAGNOSIS — R1032 Left lower quadrant pain: Secondary | ICD-10-CM

## 2013-12-06 DIAGNOSIS — R7989 Other specified abnormal findings of blood chemistry: Secondary | ICD-10-CM

## 2013-12-06 MED ORDER — CILIDINIUM-CHLORDIAZEPOXIDE 2.5-5 MG PO CAPS: 1.0000 | ORAL_CAPSULE | Freq: Three times a day (TID) | ORAL | Status: DC | PRN

## 2013-12-06 MED ORDER — HYDROCORTISONE ACETATE 25 MG RE SUPP: 25.0000 mg | Freq: Two times a day (BID) | RECTAL | Status: DC

## 2013-12-06 NOTE — Assessment & Plan Note (Signed)
Endo ref

## 2013-12-06 NOTE — Assessment & Plan Note (Signed)
Anusol HC supp GI ref Tramadol/Librax Senakot S

## 2013-12-06 NOTE — Progress Notes (Signed)
Pre visit review using our clinic review tool, if applicable. No additional management support is needed unless otherwise documented below in the visit note. 

## 2013-12-06 NOTE — Patient Instructions (Signed)
take Senakot S 1-2 for hard stool as needed

## 2013-12-06 NOTE — Assessment & Plan Note (Signed)
take Senakot S 1-2 a day as needed  GI ref Librax, Tramadol prn

## 2013-12-06 NOTE — Progress Notes (Signed)
   Subjective:     HPI  C/o severe rectal pain and episodes of LLQ abd pain x several days, resolved now F/u abn TSH   Review of Systems  Constitutional: Negative for chills, activity change, appetite change, fatigue and unexpected weight change.  HENT: Negative for congestion, mouth sores, sinus pressure and sore throat.   Eyes: Negative for pain, discharge and visual disturbance.  Respiratory: Negative for cough and chest tightness.   Gastrointestinal: Negative for nausea, abdominal pain and diarrhea.  Genitourinary: Negative for dysuria, urgency, frequency, flank pain, difficulty urinating, vaginal pain, menstrual problem, pelvic pain and dyspareunia.  Musculoskeletal: Negative for back pain and gait problem.  Skin: Negative for pallor and rash.  Neurological: Negative for dizziness, tremors, seizures, syncope, facial asymmetry, speech difficulty, weakness, light-headedness, numbness and headaches.  Psychiatric/Behavioral: Negative for suicidal ideas, confusion, sleep disturbance and decreased concentration. The patient is not nervous/anxious.        Objective:   Physical Exam  Constitutional: She appears well-developed and well-nourished. No distress.  HENT:  Head: Normocephalic.  Right Ear: External ear normal.  Left Ear: External ear normal.  Nose: Nose normal.  Mouth/Throat: Oropharynx is clear and moist.  Eyes: Conjunctivae are normal. Pupils are equal, round, and reactive to light. Right eye exhibits no discharge. Left eye exhibits no discharge.  Neck: Normal range of motion. Neck supple. No JVD present. No tracheal deviation present. No thyromegaly present.  Cardiovascular: Normal rate, regular rhythm and normal heart sounds.   Pulmonary/Chest: No stridor. No respiratory distress. She has no wheezes.  Abdominal: Soft. Bowel sounds are normal. She exhibits no distension and no mass. There is no tenderness. There is no rebound and no guarding.  Musculoskeletal: She  exhibits no edema and no tenderness.  Lymphadenopathy:    She has no cervical adenopathy.  Neurological: She displays normal reflexes. No cranial nerve deficit. She exhibits normal muscle tone. Coordination normal.  Skin: No rash noted. No erythema.  Psychiatric: She has a normal mood and affect. Her behavior is normal. Judgment and thought content normal.  sensitive LLQ  Abd CT  Lab Results  Component Value Date   WBC 4.5 11/23/2013   HGB 12.1 11/23/2013   HCT 37.5 11/23/2013   PLT 324.0 11/23/2013   GLUCOSE 84 10/17/2013   CHOL 164 10/17/2013   TRIG 58.0 10/17/2013   HDL 51.00 10/17/2013   LDLCALC 101* 10/17/2013   ALT 17 10/17/2013   AST 18 10/17/2013   NA 140 10/17/2013   K 4.3 10/17/2013   CL 106 10/17/2013   CREATININE 0.6 10/17/2013   BUN 14 10/17/2013   CO2 27 10/17/2013   TSH 0.02* 11/23/2013   INR 0.99 09/22/2012        Assessment & Plan:

## 2013-12-14 ENCOUNTER — Encounter: Payer: Self-pay | Admitting: Internal Medicine

## 2013-12-22 ENCOUNTER — Ambulatory Visit: Payer: BC Managed Care – PPO | Admitting: Internal Medicine

## 2014-01-15 ENCOUNTER — Ambulatory Visit (INDEPENDENT_AMBULATORY_CARE_PROVIDER_SITE_OTHER): Payer: BC Managed Care – PPO | Admitting: Internal Medicine

## 2014-01-15 ENCOUNTER — Encounter: Payer: Self-pay | Admitting: Internal Medicine

## 2014-01-15 VITALS — BP 102/60 | HR 73 | Temp 98.4°F | Resp 12 | Ht 62.75 in | Wt 133.8 lb

## 2014-01-15 DIAGNOSIS — E0590 Thyrotoxicosis, unspecified without thyrotoxic crisis or storm: Secondary | ICD-10-CM

## 2014-01-15 LAB — T4, FREE: Free T4: 1.22 ng/dL (ref 0.60–1.60)

## 2014-01-15 LAB — TSH

## 2014-01-15 LAB — T3, FREE: T3 FREE: 3.7 pg/mL (ref 2.3–4.2)

## 2014-01-15 NOTE — Progress Notes (Signed)
Patient ID: Linda Stewart, female   DOB: July 24, 1971, 43 y.o.   MRN: [id number]   HPI  Linda Stewart is a 43 y.o.-year-old female, referred by her PCP, Dr. 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  She remembers seeing her PCP for AP in 10/2013 >> a TSH was checked and this was low. A mo later, TSH was again low.  She does remember being sick with URI this winter. She also rememvbers trying [employer] supplements: shakes and teas >> did not feel good at that time (12-10/2012) so she stapped.   I reviewed pt's thyroid tests: Lab Results  Component Value Date   TSH 0.02* 11/23/2013   TSH 0.01* 10/17/2013   TSH 3.71 09/26/2012   TSH 2.83 11/24/2011   TSH 2.57 07/10/2011   TSH 2.17 05/26/2010   TSH 2.41 05/27/2009   TSH 1.71 09/15/2006   FREET4 1.67* 11/23/2013     Pt denies feeling nodules in neck, hoarseness, dysphagia/odynophagia, SOB with lying down; she c/o: - no excessive sweating/heat intolerance - no tremors - no anxiety - no palpitations - no problems with concentration - + fatigue, not new - no hyperdefecation - no weight loss  Pt does not have a FH of thyroid ds. No FH of thyroid cancer. No h/o radiation tx to head or neck.  No seaweed or kelp, no recent contrast studies. No steroid use. No herbal supplements.   I reviewed her chart and she also has a history of CVA - when in [place] (~43 y/o), ATIII deficiency, B12 def.  ROS: Constitutional: no weight gain/loss, + fatigue, no subjective hyperthermia/hypothermia Eyes: no blurry vision, no xerophthalmia ENT: no sore throat, no nodules palpated in throat, no dysphagia/odynophagia, no hoarseness Cardiovascular: no CP/SOB/palpitations/leg swelling Respiratory: no cough/SOB Gastrointestinal: no N/V/D/C Musculoskeletal: no muscle/joint aches Skin: no rashes Neurological: no tremors/numbness/tingling/dizziness, + HA Psychiatric: no depression/anxiety  Past Medical History  Diagnosis Date  . 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 Antithrombin 3 deficiency   . Migraine   . Ovarian cyst   . Carotid artery aneurysm    Past Surgical History  Procedure Laterality Date  . Carotid aneurysm s/p coil treatment      Currently OFF plavix  . Ovarian cyst surgery  2001  . Laparoscopy  2001   History   Social History  . Marital Status: Married    Spouse Name: N/A    Number of Children: 2  . Years of Education: N/A   Occupational History  . [employer]   Social History Main Topics  . Smoking status: Never Smoker   . Smokeless tobacco: Not on file  . Alcohol Use: No  . Drug Use: No  . Sexual Activity: Yes   Other Topics Concern  . Not on file   Social History Narrative   Immigrated to [place] 1999 from [place]         Current Outpatient Prescriptions on File Prior to Visit  Medication Sig Dispense Refill  . aspirin 325 MG EC tablet Take 325 mg by mouth daily.        . cholecalciferol (VITAMIN D) 1000 UNITS tablet Take 2,000 Units by mouth daily.        . Cyanocobalamin (VITAMIN B-12) 500 MCG SUBL Place 1 tablet (500 mcg total) under the tongue 1 day or 1 dose.  100 tablet  3  . hydrocortisone (ANUSOL-HC) 25 MG suppository Place 1 suppository (25 mg total) rectally 2 (two) times daily.  20 suppository  0  . clidinium-chlordiazePOXIDE (LIBRAX) 5-2.5 MG per capsule Take 1 capsule by mouth 3 (three) times daily as needed (abdominal spasms).  60 capsule  0  . doxycycline (VIBRA-TABS) 100 MG tablet Take 1 tablet (100 mg total) by mouth 2 (two) times daily.  20 tablet  0   No current facility-administered medications on file prior to visit.   No Known Allergies Family History  Problem Relation Age of Onset  . Leukemia Mother   . Cancer Mother 51    leukemia  . Lung cancer Father     SMOKER  . Cancer Father 35    lung ca  . Heart disease Other   . Stroke  Other   . Alcohol abuse Other   . Breast cancer Other    PE: BP 102/60  Pulse 73  Temp(Src) 98.4 F (36.9 C) (Oral)  Resp 12  Ht 5' 2.75" (1.594 m)  Wt 133 lb 12.8 oz (60.691 kg)  BMI 23.89 kg/m2  SpO2 99% Wt Readings from Last 3 Encounters:  01/15/14 133 lb 12.8 oz (60.691 kg)  12/06/13 137 lb (62.143 kg)  10/18/13 136 lb (61.689 kg)   Constitutional: normal weight, in NAD Eyes: PERRLA, EOMI, no exophthalmos, no lid lag, no stare ENT: moist mucous membranes, no thyromegaly, no thyroid bruits, no cervical lymphadenopathy Cardiovascular: RRR, No MRG Respiratory: CTA B Gastrointestinal: abdomen soft, NT, ND, BS+ Musculoskeletal: no deformities, strength intact in all 4 Skin: moist, warm, no rashes Neurological: no tremor with outstretched hands, DTR normal in all 4  ASSESSMENT: 1. Thyrotoxycosis  PLAN:  1. Patient with a recently found thyrotoxicosis x2, without thyrotoxic sxs. - her last set of TFTs was checked 2 mo ago. At that time, she might have had thyroiditis (previous URI) or could have used a high iodine supplement. Alternatively, other possible thyrotoxicosis are:  Graves ds   toxic multinodular goiter/ toxic adenoma (I cannot feel nodules at palpation of her thyroid). - I suggested that we check the TSH, fT3 and fT4  - If the tests remain abnormal I will obtain an uptake and scan to differentiate between the above possible etiologies  - asymptomatic, no beta blockers needed at this time, since she is not tachycardic, anxious, or tremulous - RTC if tests are abnormal  Office Visit on 01/15/2014  Component Date Value Ref Range Status  . TSH 01/15/2014 0.00 Repeated and verified X2.* 0.35 - 5.50 uIU/mL Final  . Free T4 01/15/2014 1.22  0.60 - 1.60 ng/dL Final  . T3, Free 01/15/2014 3.7  2.3 - 4.2 pg/mL Final   Msg sent: Dear Ms Linda Stewart, The thyroid tests have improved: the free T4 is now normal and the free T3 is also normal. The TSH is still low, but this is  slower to recover. Let's recheck the tests in 2 months and will see from there.I will order these and you can just come to the lab.  Please let me know if you have any questions. Sincerely, Philemon Kingdom MD

## 2014-01-15 NOTE — Patient Instructions (Addendum)
Please stop at the lab. We will schedule a new appointment if labs are abnormal.  Hyperthyroidism The thyroid is a large gland located in the lower front part of your neck. The thyroid helps control metabolism. Metabolism is how your body uses food. It controls metabolism with the hormone thyroxine. When the thyroid is overactive, it produces too much hormone. When this happens, these following problems may occur:   Nervousness  Heat intolerance  Weight loss (in spite of increase food intake)  Diarrhea  Change in hair or skin texture  Palpitations (heart skipping or having extra beats)  Tachycardia (rapid heart rate)  Loss of menstruation (amenorrhea)  Shaking of the hands CAUSES  Grave's Disease (the immune system attacks the thyroid gland). This is the most common cause.  Inflammation of the thyroid gland.  Tumor (usually benign) in the thyroid gland or elsewhere.  Excessive use of thyroid medications (both prescription and 'natural').  Excessive ingestion of Iodine. DIAGNOSIS  To prove hyperthyroidism, your caregiver may do blood tests and ultrasound tests. Sometimes the signs are hidden. It may be necessary for your caregiver to watch this illness with blood tests, either before or after diagnosis and treatment. TREATMENT Short-term treatment There are several treatments to control symptoms. Drugs called beta blockers may give some relief. Drugs that decrease hormone production will provide temporary relief in many people. These measures will usually not give permanent relief. Definitive therapy There are treatments available which can be discussed between you and your caregiver which will permanently treat the problem. These treatments range from surgery (removal of the thyroid), to the use of radioactive iodine (destroys the thyroid by radiation), to the use of antithyroid drugs (interfere with hormone synthesis). The first two treatments are permanent and usually  successful. They most often require hormone replacement therapy for life. This is because it is impossible to remove or destroy the exact amount of thyroid required to make a person euthyroid (normal). HOME CARE INSTRUCTIONS  See your caregiver if the problems you are being treated for get worse. Examples of this would be the problems listed above. SEEK MEDICAL CARE IF: Your general condition worsens. MAKE SURE YOU:   Understand these instructions.  Will watch your condition.  Will get help right away if you are not doing well or get worse. Document Released: 09/21/2005 Document Revised: 12/14/2011 Document Reviewed: 02/02/2007 [facility] Patient Information 2014 [place], [place].

## 2014-01-17 ENCOUNTER — Encounter: Payer: Self-pay | Admitting: Internal Medicine

## 2014-01-19 ENCOUNTER — Encounter: Payer: Self-pay | Admitting: Internal Medicine

## 2014-01-19 ENCOUNTER — Ambulatory Visit (INDEPENDENT_AMBULATORY_CARE_PROVIDER_SITE_OTHER): Payer: BC Managed Care – PPO | Admitting: Internal Medicine

## 2014-01-19 VITALS — BP 100/64 | HR 76 | Ht 62.0 in | Wt 136.2 lb

## 2014-01-19 DIAGNOSIS — R1031 Right lower quadrant pain: Secondary | ICD-10-CM

## 2014-01-19 DIAGNOSIS — R1032 Left lower quadrant pain: Secondary | ICD-10-CM

## 2014-01-19 MED ORDER — HYOSCYAMINE SULFATE 0.125 MG SL SUBL: 0.1250 mg | SUBLINGUAL_TABLET | Freq: Four times a day (QID) | SUBLINGUAL | Status: DC | PRN

## 2014-01-19 NOTE — Patient Instructions (Signed)
We have sent the following medications to your pharmacy for you to pick up at your convenience: Levsin as needed  Follow up with your Gynecologist   Follow up with Dr. Hilarie Fredrickson as needed

## 2014-01-19 NOTE — Progress Notes (Signed)
Patient ID: Linda Stewart, female   DOB: 06/29/1971, 43 y.o.   MRN: [id number] HPI: Linda Stewart is a 43 yo female with PMH of CVA on aspirin, antithrombin 3 deficiency, migraines, who is seen in consultation at the request of Dr. Alain Marion to evaluate lower abd pain.  She is here alone today. She reports she developed left lower quadrant abdominal pain which was quite severe over several days in December. She felt that this might be related to her menstrual cycle. It recurred again in February but on the right side in the lower abdomen. This was also initially related to her menstrual cycle but it spread to involve her bilateral lower abdomen and lasted about a month. She describes this as intense cramping lower abdominal pain. Her bowel movements did not change and she denies diarrhea or constipation. There was no blood in her stool or melena. She did not have fever or chills. At times the cramping was severe and she felt incomplete evacuation. This led to a CT scan of the abdomen and pelvis which was largely unremarkable. She reports that the symptoms have entirely resolved at this point. She is eating well with no weight loss. No hepatobiliary complaints. Again no abdominal pain at this time. She did have a regular menstrual cycle in March and April without similar pain. She has not seen her GYN recently.  She denies a family history of colorectal cancer, inflammatory bowel disease or celiac disease.  Past Medical History  Diagnosis Date  . 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 Antithrombin 3 deficiency   . Migraine   . Ovarian cyst   . Carotid artery aneurysm     Past Surgical History  Procedure Laterality Date  . Carotid aneurysm s/p coil treatment      Currently OFF plavix  . Ovarian cyst surgery  2001  . Laparoscopy  2001    Current Outpatient Prescriptions   Medication Sig Dispense Refill  . aspirin 325 MG EC tablet Take 325 mg by mouth daily.        . cholecalciferol (VITAMIN D) 1000 UNITS tablet Take 2,000 Units by mouth daily.        . Cyanocobalamin (VITAMIN B-12) 500 MCG SUBL Place 1 tablet (500 mcg total) under the tongue 1 day or 1 dose.  100 tablet  3  . hyoscyamine (LEVSIN/SL) 0.125 MG SL tablet Place 1 tablet (0.125 mg total) under the tongue every 6 (six) hours as needed.  45 tablet  2   No current facility-administered medications for this visit.    No Known Allergies  Family History  Problem Relation Age of Onset  . Leukemia Mother   . Cancer Mother 62    leukemia  . Lung cancer Father     SMOKER  . Cancer Father 2    lung ca  . Heart disease Other   . Stroke Other   . Alcohol abuse Other   . Breast cancer Other     History  Substance Use Topics  . Smoking status: Never Smoker   . Smokeless tobacco: Never Used  . Alcohol Use: No    ROS: As per history of present illness, otherwise negative  BP 100/64  Pulse 76  Ht 5\' 2"  (1.575 m)  Wt 136 lb 3.2 oz (61.78 kg)  BMI 24.91 kg/m2 Constitutional:  Well-developed and well-nourished. No distress. HEENT: Normocephalic and atraumatic. Oropharynx is clear and moist. No oropharyngeal exudate. Conjunctivae are normal.  No scleral icterus. Neck: Neck supple. Trachea midline. Cardiovascular: Normal rate, regular rhythm and intact distal pulses. No M/R/G Pulmonary/chest: Effort normal and breath sounds normal. No wheezing, rales or rhonchi. Abdominal: Soft, nontender, nondistended. Bowel sounds active throughout. There are no masses palpable. No hepatosplenomegaly. Extremities: no clubbing, cyanosis, or edema Lymphadenopathy: No cervical adenopathy noted. Neurological: Alert and oriented to person place and time. Skin: Skin is warm and dry. No rashes noted. Psychiatric: Normal mood and affect. Behavior is normal.  RELEVANT LABS AND IMAGING: CBC    Component Value  Date/Time   WBC 4.5 11/23/2013 0901   WBC 5.5 10/04/2013 1706   RBC 4.69 11/23/2013 0901   RBC 4.74 10/04/2013 1706   HGB 12.1 11/23/2013 0901   HGB 12.0* 10/04/2013 1706   HCT 37.5 11/23/2013 0901   HCT 39.6 10/04/2013 1706   PLT 324.0 11/23/2013 0901   MCV 79.9 11/23/2013 0901   MCV 83.5 10/04/2013 1706   MCH 25.3* 10/04/2013 1706   MCH 26.1 09/22/2012 0906   MCHC 32.3 11/23/2013 0901   MCHC 30.3* 10/04/2013 1706   RDW 13.8 11/23/2013 0901   LYMPHSABS 1.3 11/23/2013 0901   MONOABS 0.4 11/23/2013 0901   EOSABS 0.1 11/23/2013 0901   BASOSABS 0.0 11/23/2013 0901    CMP     Component Value Date/Time   NA 140 10/17/2013 0739   K 4.3 10/17/2013 0739   CL 106 10/17/2013 0739   CO2 27 10/17/2013 0739   GLUCOSE 84 10/17/2013 0739   GLUCOSE 93 09/15/2006 0851   BUN 14 10/17/2013 0739   CREATININE 0.6 10/17/2013 0739   CALCIUM 9.3 10/17/2013 0739   PROT 7.4 10/17/2013 0739   ALBUMIN 3.9 10/17/2013 0739   AST 18 10/17/2013 0739   ALT 17 10/17/2013 0739   ALKPHOS 54 10/17/2013 0739   BILITOT 0.5 10/17/2013 0739   GFRNONAA >90 09/22/2012 0906   GFRAA >90 09/22/2012 0906   CT ABDOMEN WITH CONTRAST -- Jan 2015   TECHNIQUE: Multidetector CT imaging of the abdomen was performed using the standard protocol following bolus administration of intravenous contrast.   CONTRAST:  180mL OMNIPAQUE IOHEXOL 300 MG/ML  SOLN   COMPARISON:  None.   FINDINGS: The liver, gallbladder, pancreas, spleen, adrenal glands, and kidneys are normal in appearance. No evidence of hydronephrosis.   Uterus and adnexal regions are unremarkable in appearance. No soft tissue masses or lymphadenopathy identified within the abdomen or pelvis. Tiny amount of free fluid is seen in the pelvic cul-de-sac which is likely physiologic, with 2 small right ovarian follicles noted.   No evidence of inflammatory process or abscess. No evidence of bowel wall thickening, dilatation, or hernia.   IMPRESSION: No acute findings or other  significant abnormality identified      ASSESSMENT/PLAN: 43 yo female with PMH of CVA on aspirin, antithrombin 3 deficiency, migraines, who is seen in consultation at the request of Dr. Alain Marion to evaluate lower abd pain.   1.  B/L lower abd pain, now resolved -- fortunately her bilateral lower abdominal pain has not recurred, but I am suspicious for an ovarian etiology. She has not had change in bowel habit, rectal bleeding or other predominant GI symptom. Her weight is stable. There is no concerning family history. Blood counts have been stable. Her now I have recommended observation only. If pain recurs I recommend that she be worked in  for an urgent appointment for evaluation. Have also advised that she be seen by her gynecologist for their opinion. I will give her prescription for Levsin to be used should the bilateral lower abdominal cramping recurred. Again if this does recur, she should be seen again in the office.  Followup as needed. Screening colonoscopy recommended at age 23

## 2014-03-02 ENCOUNTER — Encounter: Payer: Self-pay | Admitting: Internal Medicine

## 2014-03-02 ENCOUNTER — Ambulatory Visit (INDEPENDENT_AMBULATORY_CARE_PROVIDER_SITE_OTHER)
Admission: RE | Admit: 2014-03-02 | Discharge: 2014-03-02 | Disposition: A | Discharge status: Home / Self Care | Payer: BC Managed Care – PPO | Source: Ambulatory Visit | Attending: Internal Medicine | Admitting: Internal Medicine

## 2014-03-02 ENCOUNTER — Ambulatory Visit (INDEPENDENT_AMBULATORY_CARE_PROVIDER_SITE_OTHER): Payer: BC Managed Care – PPO | Admitting: Internal Medicine

## 2014-03-02 VITALS — BP 118/88 | HR 99 | Temp 98.3°F | Wt 135.4 lb

## 2014-03-02 DIAGNOSIS — R079 Chest pain, unspecified: Secondary | ICD-10-CM

## 2014-03-02 DIAGNOSIS — Z8679 Personal history of other diseases of the circulatory system: Secondary | ICD-10-CM

## 2014-03-02 DIAGNOSIS — D689 Coagulation defect, unspecified: Secondary | ICD-10-CM

## 2014-03-02 MED ORDER — TRAMADOL HCL 50 MG PO TABS: 50.0000 mg | ORAL_TABLET | Freq: Four times a day (QID) | ORAL | Status: DC | PRN

## 2014-03-02 NOTE — Telephone Encounter (Signed)
MD informed of patient message. I called her to schedule OV. No answer. Sent pt message advising OV today.

## 2014-03-02 NOTE — Progress Notes (Signed)
Subjective:    Patient ID: Linda Stewart, female    DOB: 1971-06-28, 43 y.o.   MRN: [id number]  HPI   She began to have pressure and pain at the base of the neck anteriorly 03/01/14 at approximately 5 PM while driving home. This did resolve only to recur last night, awakening her. It radiates into the upper anterior chest and is aggravated by deep breathing. It also radiated to the right upper extremity and hand. She describes the hand as feeling "heavy". She also felt that she's had some shortness of breath and palpitations with the symptoms  Significant past history includes an incidental CT scan of the brain finding in her mid 31s suggesting "stroke". The CT was done as part of an evaluation for headache. The Problem List includes PMH CVA & history of aneurysm. Also listed is anti thrombin 3 deficiency.  She's recently been evaluated for profoundly low TSH levels. To date endocrinologic evaluation is negative  She denies associated nausea or sweating with the pain.  There is no specific trigger such as food intake or nonsteroidal ingestion which initiates or aggravates the pain.   Review of Systems  She denies significant dyspepsia, dysphagia, abdominal pain, unexplained weight loss, melena, or rectal bleeding  She has no cough, sputum production, or hemoptysis with the chest discomfort  She's had no ankle edema. She denies any recent immobilization or prolonged travel.  She's had no recent paroxysmal nocturnal dyspnea. She has no claudication symptoms  She also denies dizziness or presyncope.  She denies significant anxiety or depression. She denies any significant stressors at this time  She has no constellation of headache, chest pain, flushing, or diarrhea.       Objective:   Physical Exam Gen.: Thin but healthy and well-nourished in appearance. Alert, appropriate and cooperative throughout exam. Appears younger than stated age  Head: Normocephalic without obvious  abnormalities Eyes: No corneal or conjunctival inflammation noted. Pupils equal round reactive to light and accommodation. Extraocular motion intact. Unsustained vertical nystagmus Ears: External  ear exam reveals no significant lesions or deformities. Canals clear .TMs normal. Hearing is grossly normal bilaterally. Nose: External nasal exam reveals no deformity or inflammation. Nasal mucosa are pink and moist. No lesions or exudates noted.   Mouth: Oral mucosa and oropharynx reveal no lesions or exudates. Teeth in good repair. Neck: No deformities, masses, or tenderness noted. Range of motion normal. Thyroid small Lungs: Normal respiratory effort; chest expands symmetrically. Lungs are clear to auscultation without rales, wheezes, or increased work of breathing. Heart: Normal rate and rhythm. Normal S1 and S2. No gallop, click, or rub. S4 w/o murmur. Abdomen: Bowel sounds normal; abdomen soft and nontender. No masses, organomegaly or hernias noted.                                Musculoskeletal/extremities: No deformity or scoliosis noted of  the thoracic or lumbar spine.  No clubbing, cyanosis, edema, or significant extremity  deformity noted. Range of motion normal .Tone & strength normal. Hand joints normal Fingernail / toenail health good. Able to lie down & sit up w/o help. Negative SLR bilaterally Vascular: Carotid, radial artery, dorsalis pedis and  posterior tibial pulses are full and equal. No bruits present.Neg Homan's Neurologic: Alert and oriented x3. Deep tendon reflexes symmetrical and normal.  Gait normal .      Skin: Intact without suspicious lesions or rashes. Ankle tatoo Lymph: No  cervical, axillary lymphadenopathy present. Psych: Mood and affect are normal. Normally interactive                                                                                       Assessment & Plan:  #1 atypical chest/neck/right upper extremity discomfort. EKG reveals low voltage in  precordial leads which is most likely related to her gender. The most likely etiology would be esophageal reflux with esophageal spasm  Plan: She'll be asked to take omeprazole 20 mg before the breakfast meal and initiate antireflux measures while the symptoms are being evaluated. She will have a chest x-ray, d-dimer, and troponin 1. Stress testing could be considered if evaluation is negative.

## 2014-03-02 NOTE — Patient Instructions (Signed)
Your next office appointment will be determined based upon review of your pending labs & x-rays. Those instructions will be transmitted to you through My Chart . Take Prilosec OTC 20 mg daily before breakfast as trial. Reflux of gastric acid may be asymptomatic as this may occur mainly during sleep.The triggers for reflux  include stress; the "aspirin family" ; alcohol; peppermint; and caffeine (coffee, tea, cola, and chocolate). The aspirin family would include aspirin and the nonsteroidal agents such as ibuprofen &  Naproxen. Tylenol would not cause reflux. If having symptoms ; food & drink should be avoided for @ least 2 hours before going to bed. Reflux of gastric acid may be asymptomatic as this may occur mainly during sleep.The triggers for reflux  include stress; the "aspirin family" ; alcohol; peppermint; and caffeine (coffee, tea, cola, and chocolate). The aspirin family would include aspirin and the nonsteroidal agents such as ibuprofen &  Naproxen. Tylenol would not cause reflux. If having symptoms ; food & drink should be avoided for @ least 2 hours before going to bed.

## 2014-03-02 NOTE — Progress Notes (Signed)
Pre visit review using our clinic review tool, if applicable. No additional management support is needed unless otherwise documented below in the visit note. 

## 2014-03-04 ENCOUNTER — Encounter: Payer: Self-pay | Admitting: Internal Medicine

## 2014-07-20 ENCOUNTER — Other Ambulatory Visit: Payer: Self-pay

## 2014-10-18 ENCOUNTER — Other Ambulatory Visit: Payer: BC Managed Care – PPO

## 2014-10-23 ENCOUNTER — Ambulatory Visit (INDEPENDENT_AMBULATORY_CARE_PROVIDER_SITE_OTHER): Payer: BC Managed Care – PPO | Admitting: Internal Medicine

## 2014-10-23 ENCOUNTER — Encounter: Payer: Self-pay | Admitting: Internal Medicine

## 2014-10-23 VITALS — BP 112/80 | HR 83 | Temp 98.4°F | Ht 61.0 in | Wt 139.0 lb

## 2014-10-23 DIAGNOSIS — Z0000 Encounter for general adult medical examination without abnormal findings: Secondary | ICD-10-CM

## 2014-10-23 DIAGNOSIS — E538 Deficiency of other specified B group vitamins: Secondary | ICD-10-CM

## 2014-10-23 DIAGNOSIS — E0500 Thyrotoxicosis with diffuse goiter without thyrotoxic crisis or storm: Secondary | ICD-10-CM

## 2014-10-23 DIAGNOSIS — Z23 Encounter for immunization: Secondary | ICD-10-CM

## 2014-10-23 MED ORDER — HYOSCYAMINE SULFATE 0.125 MG SL SUBL: 0.1250 mg | SUBLINGUAL_TABLET | Freq: Four times a day (QID) | SUBLINGUAL | Status: DC | PRN

## 2014-10-23 NOTE — Assessment & Plan Note (Signed)
Continue with current prescription therapy as reflected on the Med list. Labs  

## 2014-10-23 NOTE — Progress Notes (Signed)
    Subjective:     HPI  The patient is here for a wellness exam. The patient has been doing well overall without major physical or psychological issues going on lately, except for  Recurrent episode of LLQ abd pain x years. It may last all day  BP Readings from Last 3 Encounters:  10/23/14 112/80  03/02/14 118/88  01/19/14 100/64   Wt Readings from Last 3 Encounters:  10/23/14 139 lb (63.05 kg)  03/02/14 135 lb 6.4 oz (61.417 kg)  01/19/14 136 lb 3.2 oz (61.78 kg)     Review of Systems  Constitutional: Negative for chills, activity change, appetite change, fatigue and unexpected weight change.  HENT: Negative for congestion, mouth sores, sinus pressure and sore throat.   Eyes: Negative for pain, discharge and visual disturbance.  Respiratory: Negative for cough and chest tightness.   Gastrointestinal: Negative for nausea, abdominal pain and diarrhea.  Genitourinary: Negative for dysuria, urgency, frequency, flank pain, difficulty urinating, vaginal pain, menstrual problem, pelvic pain and dyspareunia.  Musculoskeletal: Negative for back pain and gait problem.  Skin: Negative for pallor and rash.  Neurological: Negative for dizziness, tremors, seizures, syncope, facial asymmetry, speech difficulty, weakness, light-headedness, numbness and headaches.  Psychiatric/Behavioral: Negative for suicidal ideas, confusion, sleep disturbance and decreased concentration. The patient is not nervous/anxious.        Objective:   Physical Exam  Constitutional: She appears well-developed and well-nourished. No distress.  HENT:  Head: Normocephalic.  Right Ear: External ear normal.  Left Ear: External ear normal.  Nose: Nose normal.  Mouth/Throat: Oropharynx is clear and moist.  Eyes: Conjunctivae are normal. Pupils are equal, round, and reactive to light. Right eye exhibits no discharge. Left eye exhibits no discharge.  Neck: Normal range of motion. Neck supple. No JVD present. No  tracheal deviation present. No thyromegaly present.  Cardiovascular: Normal rate, regular rhythm and normal heart sounds.   Pulmonary/Chest: No stridor. No respiratory distress. She has no wheezes.  Abdominal: Soft. Bowel sounds are normal. She exhibits no distension and no mass. There is no tenderness. There is no rebound and no guarding.  Musculoskeletal: She exhibits no edema and no tenderness.  Lymphadenopathy:    She has no cervical adenopathy.  Neurological: She displays normal reflexes. No cranial nerve deficit. She exhibits normal muscle tone. Coordination normal.  Skin: No rash noted. No erythema.  Psychiatric: She has a normal mood and affect. Her behavior is normal. Judgment and thought content normal.     Lab Results  Component Value Date   WBC 4.5 11/23/2013   HGB 12.1 11/23/2013   HCT 37.5 11/23/2013   PLT 324.0 11/23/2013   GLUCOSE 84 10/17/2013   CHOL 164 10/17/2013   TRIG 58.0 10/17/2013   HDL 51.00 10/17/2013   LDLCALC 101* 10/17/2013   ALT 17 10/17/2013   AST 18 10/17/2013   NA 140 10/17/2013   K 4.3 10/17/2013   CL 106 10/17/2013   CREATININE 0.6 10/17/2013   BUN 14 10/17/2013   CO2 27 10/17/2013   TSH 0.00 Repeated and verified X2.* 01/15/2014   INR 0.99 09/22/2012        Assessment & Plan:

## 2014-10-23 NOTE — Assessment & Plan Note (Signed)
We discussed age appropriate health related issues, including available/recomended screening tests and vaccinations. We discussed a need for adhering to healthy diet and exercise. Labs/EKG were reviewed/ordered. All questions were answered.  Flu shot GYN 1 12 mo

## 2014-10-23 NOTE — Assessment & Plan Note (Signed)
Labs Not on meds

## 2014-11-20 ENCOUNTER — Other Ambulatory Visit (INDEPENDENT_AMBULATORY_CARE_PROVIDER_SITE_OTHER): Payer: BC Managed Care – PPO

## 2014-11-20 DIAGNOSIS — E538 Deficiency of other specified B group vitamins: Secondary | ICD-10-CM

## 2014-11-20 DIAGNOSIS — E0500 Thyrotoxicosis with diffuse goiter without thyrotoxic crisis or storm: Secondary | ICD-10-CM

## 2014-11-20 DIAGNOSIS — Z0000 Encounter for general adult medical examination without abnormal findings: Secondary | ICD-10-CM

## 2014-11-20 LAB — HEPATIC FUNCTION PANEL
ALBUMIN: 4.2 g/dL (ref 3.5–5.2)
ALT: 19 U/L (ref 0–35)
AST: 18 U/L (ref 0–37)
Alkaline Phosphatase: 76 U/L (ref 39–117)
BILIRUBIN TOTAL: 0.4 mg/dL (ref 0.2–1.2)
Bilirubin, Direct: 0.1 mg/dL (ref 0.0–0.3)
Total Protein: 7.6 g/dL (ref 6.0–8.3)

## 2014-11-20 LAB — CBC WITH DIFFERENTIAL/PLATELET
BASOS ABS: 0 10*3/uL (ref 0.0–0.1)
Basophils Relative: 0.4 % (ref 0.0–3.0)
EOS ABS: 0.4 10*3/uL (ref 0.0–0.7)
Eosinophils Relative: 7.8 % — ABNORMAL HIGH (ref 0.0–5.0)
HEMATOCRIT: 39.3 % (ref 36.0–46.0)
HEMOGLOBIN: 13.3 g/dL (ref 12.0–15.0)
LYMPHS ABS: 1.3 10*3/uL (ref 0.7–4.0)
Lymphocytes Relative: 23.3 % (ref 12.0–46.0)
MCHC: 33.7 g/dL (ref 30.0–36.0)
MCV: 76.1 fl — AB (ref 78.0–100.0)
Monocytes Absolute: 0.5 10*3/uL (ref 0.1–1.0)
Monocytes Relative: 9.8 % (ref 3.0–12.0)
NEUTROS PCT: 58.7 % (ref 43.0–77.0)
Neutro Abs: 3.3 10*3/uL (ref 1.4–7.7)
Platelets: 250 10*3/uL (ref 150.0–400.0)
RBC: 5.17 Mil/uL — ABNORMAL HIGH (ref 3.87–5.11)
RDW: 14.5 % (ref 11.5–15.5)
WBC: 5.6 10*3/uL (ref 4.0–10.5)

## 2014-11-20 LAB — LIPID PANEL
CHOL/HDL RATIO: 3
Cholesterol: 177 mg/dL (ref 0–200)
HDL: 53.3 mg/dL (ref >39.00)
LDL Cholesterol: 109 mg/dL — ABNORMAL HIGH (ref 0–99)
NonHDL: 123.7
Triglycerides: 76 mg/dL (ref 0.0–149.0)
VLDL: 15.2 mg/dL (ref 0.0–40.0)

## 2014-11-20 LAB — BASIC METABOLIC PANEL
BUN: 18 mg/dL (ref 6–23)
CHLORIDE: 106 meq/L (ref 96–112)
CO2: 25 mEq/L (ref 19–32)
Calcium: 9.6 mg/dL (ref 8.4–10.5)
Creatinine, Ser: 0.68 mg/dL (ref 0.40–1.20)
GFR: 100.72 mL/min (ref >60.00)
GLUCOSE: 97 mg/dL (ref 70–99)
POTASSIUM: 4.1 meq/L (ref 3.5–5.1)
Sodium: 138 mEq/L (ref 135–145)

## 2014-11-20 LAB — T4, FREE: Free T4: 1.19 ng/dL (ref 0.60–1.60)

## 2014-11-20 LAB — URINALYSIS, ROUTINE W REFLEX MICROSCOPIC
Ketones, ur: NEGATIVE
Leukocytes, UA: NEGATIVE
NITRITE: NEGATIVE
TOTAL PROTEIN, URINE-UPE24: NEGATIVE
UROBILINOGEN UA: 0.2 (ref 0.0–1.0)
Urine Glucose: NEGATIVE
pH: 6 (ref 5.0–8.0)

## 2014-11-20 LAB — VITAMIN B12: Vitamin B-12: 394 pg/mL (ref 211–911)

## 2014-11-20 LAB — TSH: TSH: 0.19 u[IU]/mL — ABNORMAL LOW (ref 0.35–4.50)

## 2015-03-08 LAB — HM MAMMOGRAPHY

## 2015-06-17 ENCOUNTER — Telehealth: Payer: Self-pay | Admitting: Internal Medicine

## 2015-06-17 NOTE — Telephone Encounter (Signed)
Pt called stated she her OBGYN found the polyp in her uterus and they need an okey from Dr. Camila Li to get the percedure done. Offer an appt but pt was wondering if she need an appt since she was here in 10/2014 for a physical. Please advise.

## 2015-06-17 NOTE — Telephone Encounter (Signed)
Ok to have a procedure done Thx

## 2015-06-20 NOTE — Telephone Encounter (Signed)
Spoke with pt and she going call her OBGYN and see what all we have to do to help her get this done. Pt will call us back

## 2015-10-30 ENCOUNTER — Ambulatory Visit (INDEPENDENT_AMBULATORY_CARE_PROVIDER_SITE_OTHER): Payer: BC Managed Care – PPO | Admitting: Internal Medicine

## 2015-10-30 ENCOUNTER — Encounter: Payer: Self-pay | Admitting: Internal Medicine

## 2015-10-30 ENCOUNTER — Other Ambulatory Visit (INDEPENDENT_AMBULATORY_CARE_PROVIDER_SITE_OTHER): Payer: BC Managed Care – PPO

## 2015-10-30 VITALS — BP 110/80 | HR 91 | Ht 61.0 in | Wt 140.0 lb

## 2015-10-30 DIAGNOSIS — R1032 Left lower quadrant pain: Secondary | ICD-10-CM

## 2015-10-30 DIAGNOSIS — Z23 Encounter for immunization: Secondary | ICD-10-CM

## 2015-10-30 DIAGNOSIS — E538 Deficiency of other specified B group vitamins: Secondary | ICD-10-CM | POA: Diagnosis not present

## 2015-10-30 DIAGNOSIS — Z0000 Encounter for general adult medical examination without abnormal findings: Secondary | ICD-10-CM | POA: Diagnosis not present

## 2015-10-30 DIAGNOSIS — G44219 Episodic tension-type headache, not intractable: Secondary | ICD-10-CM

## 2015-10-30 LAB — HEPATIC FUNCTION PANEL
ALBUMIN: 4.1 g/dL (ref 3.5–5.2)
ALT: 18 U/L (ref 0–35)
AST: 16 U/L (ref 0–37)
Alkaline Phosphatase: 59 U/L (ref 39–117)
Bilirubin, Direct: 0.1 mg/dL (ref 0.0–0.3)
Total Bilirubin: 0.3 mg/dL (ref 0.2–1.2)
Total Protein: 7.3 g/dL (ref 6.0–8.3)

## 2015-10-30 LAB — CBC WITH DIFFERENTIAL/PLATELET
BASOS PCT: 0.6 % (ref 0.0–3.0)
Basophils Absolute: 0 10*3/uL (ref 0.0–0.1)
EOS PCT: 2.1 % (ref 0.0–5.0)
Eosinophils Absolute: 0.1 10*3/uL (ref 0.0–0.7)
HCT: 38.2 % (ref 36.0–46.0)
Hemoglobin: 12.5 g/dL (ref 12.0–15.0)
LYMPHS ABS: 1.9 10*3/uL (ref 0.7–4.0)
Lymphocytes Relative: 41.6 % (ref 12.0–46.0)
MCHC: 32.6 g/dL (ref 30.0–36.0)
MCV: 77.6 fl — AB (ref 78.0–100.0)
MONOS PCT: 10.4 % (ref 3.0–12.0)
Monocytes Absolute: 0.5 10*3/uL (ref 0.1–1.0)
NEUTROS ABS: 2.1 10*3/uL (ref 1.4–7.7)
NEUTROS PCT: 45.3 % (ref 43.0–77.0)
PLATELETS: 269 10*3/uL (ref 150.0–400.0)
RBC: 4.92 Mil/uL (ref 3.87–5.11)
RDW: 14.2 % (ref 11.5–15.5)
WBC: 4.6 10*3/uL (ref 4.0–10.5)

## 2015-10-30 LAB — BASIC METABOLIC PANEL
BUN: 13 mg/dL (ref 6–23)
CO2: 29 meq/L (ref 19–32)
Calcium: 9.5 mg/dL (ref 8.4–10.5)
Chloride: 104 mEq/L (ref 96–112)
Creatinine, Ser: 0.55 mg/dL (ref 0.40–1.20)
GFR: 128.09 mL/min (ref >60.00)
GLUCOSE: 100 mg/dL — AB (ref 70–99)
POTASSIUM: 3.9 meq/L (ref 3.5–5.1)
SODIUM: 140 meq/L (ref 135–145)

## 2015-10-30 LAB — URINALYSIS
BILIRUBIN URINE: NEGATIVE
HGB URINE DIPSTICK: NEGATIVE
Ketones, ur: NEGATIVE
Leukocytes, UA: NEGATIVE
NITRITE: NEGATIVE
Specific Gravity, Urine: <=1.005 — AB (ref 1.000–1.030)
TOTAL PROTEIN, URINE-UPE24: NEGATIVE
Urine Glucose: NEGATIVE
Urobilinogen, UA: 0.2 (ref 0.0–1.0)
pH: 7 (ref 5.0–8.0)

## 2015-10-30 LAB — VITAMIN B12: VITAMIN B 12: 873 pg/mL (ref 211–911)

## 2015-10-30 LAB — LIPID PANEL
CHOL/HDL RATIO: 3
Cholesterol: 144 mg/dL (ref 0–200)
HDL: 57.4 mg/dL (ref >39.00)
LDL Cholesterol: 70 mg/dL (ref 0–99)
NONHDL: 87.09
TRIGLYCERIDES: 87 mg/dL (ref 0.0–149.0)
VLDL: 17.4 mg/dL (ref 0.0–40.0)

## 2015-10-30 LAB — TSH: TSH: 0.02 u[IU]/mL — ABNORMAL LOW (ref 0.35–4.50)

## 2015-10-30 LAB — SEDIMENTATION RATE: SED RATE: 27 mm/h — AB (ref 0–22)

## 2015-10-30 MED ORDER — CILIDINIUM-CHLORDIAZEPOXIDE 2.5-5 MG PO CAPS: 1.0000 | ORAL_CAPSULE | Freq: Three times a day (TID) | ORAL | Status: DC

## 2015-10-30 NOTE — Assessment & Plan Note (Signed)
No change PRN meds

## 2015-10-30 NOTE — Assessment & Plan Note (Signed)
On B12 Labs 

## 2015-10-30 NOTE — Patient Instructions (Signed)
Diet for Lactose Intolerance, Adult  Lactose intolerance is when the body is not able to digest lactose, a natural sugar found in milk and milk products. If you are lactose intolerant, you should avoid consuming food and drinks with lactose.   WHAT DO I NEED TO KNOW ABOUT THIS DIET?  · Avoid consuming foods and beverages with lactose.  · Look for the words "lactose-free" or "lactose-reduced" on food labels. You can have lactose-free foods and may be able to have small amounts of lactose-reduced foods.  · Make sure you get enough nutrients in your diet. People on this diet sometimes have trouble getting enough calcium, riboflavin, and vitamin D. Take supplements if directed by your health care provider. Talk to your health care provider about supplements if you are not taking any.  WHICH FOODS HAVE LACTOSE?  Lactose is found in milk and milk products, such as:   · Yogurt.    · Cheese.  · Butter.    · Margarine.  · Sour cream.    · Creamer.    · Whipped toppings and nondairy creamers.  · Ice cream and other milk-based desserts.  Lactose is also found in foods made with milk or milk ingredients. To find out whether a food is made with milk or a milk ingredient, look at the ingredients list. Avoid foods with the statement "May contain milk" and foods that contain:   · Butter.    · Cream.   · Milk.  · Milk solids.  · Milk powder.     · Whey.  · Curd.   · Caseinate.  · Lactose.  WHAT ARE SOME ALTERNATIVES TO MILK AND FOODS MADE WITH MILK PRODUCTS?  · Lactose-free products, such as lactose-free milk.  · Almond or rice milk.  · Soy products, such as soy yogurt, soy cheese, soy ice cream, soy-based sour cream, and soy-based infant formula.  · Nondairy products, such as nondairy creamers and nondairy whipped topping. Note that nondairy products sometimes contain lactose, so it is important to check the ingredients list.  CAN I HAVE ANY FOODS WITH LACTOSE?  Some people with lactose intolerance can safely eat foods that have a  little lactose. Foods with a little lactose have less than 1 g of lactose per serving. Examples of foods with a little lactose are:   · Aged cheese (such as Swiss, cheddar, or Parmesan cheese). One serving is about 1-2 oz.  · Cream cheese. One serving is about 2 Tbsp.  · Ricotta cheese. One serving is about ½ cup.  If you decide to try a food that has lactose:   · Eat only one food with lactose in it at a time.  · Eat only a small amount of the food.  · Stop eating the food if your symptoms return.  Some dairy products that are more likely than others to be tolerated include:  · Cheese, especially if it is aged.  · Cultured dairy products, such as yogurt, buttermilk, cottage cheese, and kefir. The healthy bacteria in these products help digest lactose.  · Lactose-hydrolyzed milk. This product contains 40-90% less lactose than milk.  AM I GETTING ENOUGH CALCIUM?  Calcium is found in many foods with lactose and is important for bone health. The amount of calcium you need depends on your age:   · Adults younger than 50 years need 1000 mg of calcium a day.  · Adults older than 50 years need 1200 mg of calcium a day.  Make sure you get enough calcium by taking a calcium supplement   or by eating lactose-free foods that are high in calcium, such as:   · Almonds, ¼ cup (95 mg).  · Broccoli, cooked, 1 cup (60 mg).  · Calcium-fortified breakfast cereals, 1 cup (100-1000 mg).  · Calcium-fortified rice or almond milk, 1 cup (300 mg).  · Calcium-fortified soy milk, 1 cup (300-400 mg).  · Canned salmon with edible bones, 3 oz (180 mg).  · Collard greens, cooked, ½ cup (125 mg).  · Edamame, cooked, ½ cup (125 mg).  · Kale, frozen or cooked, ½ cup (90 mg).  · Orange juice with calcium added, 1 cup (300-350 mg).  · Sardines with edible bones, 3 oz (325 mg).  · Spinach, cooked, ½ cup (145 mg).  · Tofu set with calcium sulfate, ½ cup (250 mg).     This information is not intended to replace advice given to you by your health care  provider. Make sure you discuss any questions you have with your health care provider.     Document Released: 04/18/2014 Document Reviewed: 04/18/2014  Elsevier Interactive Patient Education ©2016 [employer] Inc.

## 2015-10-30 NOTE — Progress Notes (Signed)
Pre visit review using our clinic review tool, if applicable. No additional management support is needed unless otherwise documented below in the visit note. 

## 2015-10-30 NOTE — Assessment & Plan Note (Signed)
CT OK 2015 GI - Dr Hilarie Fredrickson recommended colon at 45 yo 1/17 try milk free diet. Consider Viberzi Try Librax prn  Potential benefits of a long term benzodiazepines  use as well as potential risks  and complications were explained to the patient and were aknowledged.

## 2015-10-30 NOTE — Progress Notes (Signed)
Subjective:  Patient ID: Linda Stewart, female    DOB: [birth year]  Age: 45 y.o. MRN: [id number]  CC: Annual Exam   HPI Linda Stewart presents for a well exam. C/o abd pain/cramps w/BMs Pt was dx'd w/endometrial polyp - bx pending  Outpatient Prescriptions Prior to Visit  Medication Sig Dispense Refill  . aspirin 325 MG EC tablet Take 325 mg by mouth daily.      . cholecalciferol (VITAMIN D) 1000 UNITS tablet Take 2,000 Units by mouth daily.      . Cyanocobalamin (VITAMIN B-12) 500 MCG SUBL Place 1 tablet (500 mcg total) under the tongue 1 day or 1 dose. 100 tablet 3  . hyoscyamine (LEVSIN/SL) 0.125 MG SL tablet Place 1 tablet (0.125 mg total) under the tongue every 6 (six) hours as needed. 45 tablet 2  . traMADol (ULTRAM) 50 MG tablet Take 1 tablet (50 mg total) by mouth every 6 (six) hours as needed. (Patient not taking: Reported on 10/30/2015) 30 tablet 0   No facility-administered medications prior to visit.    ROS Review of Systems  Constitutional: Positive for fatigue. Negative for chills, activity change, appetite change and unexpected weight change.  HENT: Negative for congestion, mouth sores and sinus pressure.   Eyes: Negative for visual disturbance.  Respiratory: Negative for cough and chest tightness.   Gastrointestinal: Positive for abdominal pain and diarrhea. Negative for nausea.  Genitourinary: Negative for frequency, difficulty urinating and vaginal pain.  Musculoskeletal: Negative for back pain and gait problem.  Skin: Negative for pallor and rash.  Neurological: Negative for dizziness, tremors, weakness, numbness and headaches.  Psychiatric/Behavioral: Negative for confusion and sleep disturbance.    Objective:  BP 110/80 mmHg  Pulse 91  Ht 5\' 1"  (1.549 m)  Wt 140 lb (63.504 kg)  BMI 26.47 kg/m2  SpO2 98%  BP Readings from Last 3 Encounters:  10/30/15 110/80  10/23/14 112/80  03/02/14 118/88    Wt Readings from Last 3 Encounters:  10/30/15  140 lb (63.504 kg)  10/23/14 139 lb (63.05 kg)  03/02/14 135 lb 6.4 oz (61.417 kg)    Physical Exam  Constitutional: She appears well-developed. No distress.  HENT:  Head: Normocephalic.  Right Ear: External ear normal.  Left Ear: External ear normal.  Nose: Nose normal.  Mouth/Throat: Oropharynx is clear and moist.  Eyes: Conjunctivae are normal. Pupils are equal, round, and reactive to light. Right eye exhibits no discharge. Left eye exhibits no discharge.  Neck: Normal range of motion. Neck supple. No JVD present. No tracheal deviation present. No thyromegaly present.  Cardiovascular: Normal rate, regular rhythm and normal heart sounds.   Pulmonary/Chest: No stridor. No respiratory distress. She has no wheezes.  Abdominal: Soft. Bowel sounds are normal. She exhibits no distension and no mass. There is no tenderness. There is no rebound and no guarding.  Musculoskeletal: She exhibits no edema or tenderness.  Lymphadenopathy:    She has no cervical adenopathy.  Neurological: She displays normal reflexes. No cranial nerve deficit. She exhibits normal muscle tone. Coordination normal.  Skin: No rash noted. No erythema.  Psychiatric: She has a normal mood and affect. Her behavior is normal. Judgment and thought content normal.    Lab Results  Component Value Date   WBC 4.6 10/30/2015   HGB 12.5 10/30/2015   HCT 38.2 10/30/2015   PLT 269.0 10/30/2015   GLUCOSE 100* 10/30/2015   CHOL 144 10/30/2015   TRIG 87.0 10/30/2015   HDL 57.40 10/30/2015  LDLCALC 70 10/30/2015   ALT 18 10/30/2015   AST 16 10/30/2015   NA 140 10/30/2015   K 3.9 10/30/2015   CL 104 10/30/2015   CREATININE 0.55 10/30/2015   BUN 13 10/30/2015   CO2 29 10/30/2015   TSH 0.02* 10/30/2015   INR 0.99 09/22/2012    Dg Chest 2 View  03/02/2014  CLINICAL DATA:  Upper chest pain and shortness of Breath. EXAM: CHEST  2 VIEW COMPARISON:  07/03/2010 FINDINGS: Normal heart, mediastinum and hila. Clear lungs.  No  pleural effusion or pneumothorax. There is a pectus excavatum deformity, stable. The bony thorax is otherwise unremarkable. IMPRESSION: No active cardiopulmonary disease. Electronically Signed   By: Lajean Manes M.D.   On: 03/02/2014 13:34    Assessment & Plan:   Linda Stewart was seen today for annual exam.  Diagnoses and all orders for this visit:  Well adult exam -     Basic metabolic panel; Future -     CBC with Differential/Platelet; Future -     Hepatic function panel; Future -     Urinalysis; Future -     TSH; Future -     Lipid panel; Future -     Sedimentation rate; Future -     Vitamin B12; Future  Episodic tension-type headache, not intractable  B12 deficiency -     Vitamin B12; Future  LLQ abdominal pain -     Sedimentation rate; Future  Encounter for immunization  Need for influenza vaccination  Other orders -     clidinium-chlordiazePOXIDE (LIBRAX) 5-2.5 MG capsule; Take 1-2 capsules by mouth 3 (three) times daily before meals. -     Flu Vaccine QUAD 36+ mos IM  I have discontinued Ms. Leggitt's hyoscyamine. I am also having her start on clidinium-chlordiazePOXIDE. Additionally, I am having her maintain her aspirin, cholecalciferol, Vitamin B-12, and traMADol.  Meds ordered this encounter  Medications  . clidinium-chlordiazePOXIDE (LIBRAX) 5-2.5 MG capsule    Sig: Take 1-2 capsules by mouth 3 (three) times daily before meals.    Dispense:  90 capsule    Refill:  3     Follow-up: No Follow-up on file.  Walker Kehr, MD

## 2015-10-31 ENCOUNTER — Encounter: Payer: Self-pay | Admitting: Internal Medicine

## 2015-12-02 ENCOUNTER — Encounter (HOSPITAL_COMMUNITY)
Admission: RE | Admit: 2015-12-02 | Discharge: 2015-12-02 | Disposition: A | Discharge status: Home / Self Care | Payer: BC Managed Care – PPO | Source: Ambulatory Visit | Attending: Obstetrics and Gynecology | Admitting: Obstetrics and Gynecology

## 2015-12-02 ENCOUNTER — Encounter (HOSPITAL_COMMUNITY): Payer: Self-pay

## 2015-12-02 DIAGNOSIS — Z01812 Encounter for preprocedural laboratory examination: Secondary | ICD-10-CM | POA: Insufficient documentation

## 2015-12-02 LAB — CBC
HCT: 36 % (ref 36.0–46.0)
HEMOGLOBIN: 11.8 g/dL — AB (ref 12.0–15.0)
MCH: 25.3 pg — AB (ref 26.0–34.0)
MCHC: 32.8 g/dL (ref 30.0–36.0)
MCV: 77.3 fL — AB (ref 78.0–100.0)
PLATELETS: 241 10*3/uL (ref 150–400)
RBC: 4.66 MIL/uL (ref 3.87–5.11)
RDW: 14 % (ref 11.5–15.5)
WBC: 3.5 10*3/uL — ABNORMAL LOW (ref 4.0–10.5)

## 2015-12-02 NOTE — Patient Instructions (Signed)
   Your procedure is scheduled on: MARCH 3 AT 830AM  Enter through the Main Entrance of [facility] at: [place] up the phone at the desk and dial [phone] and inform us of your arrival.  Please call this number if you have any problems the morning of surgery: [phone]  DO NOT EAT OR DRINK AFTER MIDNIGHT MARCH 2 (THURSDAY)   Take these medicines the morning of surgery with a SIP OF WATER:  Do not wear jewelry, make-up, or FINGER nail polish No metal in your hair or on your body. Do not wear lotions, powders, perfumes.  You may wear deodorant.  Do not bring valuables to the hospital. Contacts, dentures or bridgework may not be worn into surgery.      Patients discharged on the day of surgery will not be allowed to drive home.

## 2015-12-05 ENCOUNTER — Encounter (HOSPITAL_COMMUNITY): Payer: Self-pay | Admitting: Obstetrics and Gynecology

## 2015-12-05 ENCOUNTER — Encounter (HOSPITAL_COMMUNITY): Payer: Self-pay | Admitting: Anesthesiology

## 2015-12-05 DIAGNOSIS — N840 Polyp of corpus uteri: Secondary | ICD-10-CM

## 2015-12-05 DIAGNOSIS — R102 Pelvic and perineal pain: Secondary | ICD-10-CM | POA: Diagnosis present

## 2015-12-05 HISTORY — DX: Pelvic and perineal pain: R10.2

## 2015-12-05 HISTORY — DX: Polyp of corpus uteri: N84.0

## 2015-12-05 NOTE — H&P (Signed)
Linda Stewart is an 45 y.o. female G2P2 with pelvic pain and polyp on pelvic US.  Pt with h/o aneurysm coiled, CVA, AT3 deficiency. Pt was to get medical clearance. Can't locate clearance.  D/W Dr Royce Macadamia - anesthesia. Pt for diagnostic laparoscopy and operative hysteroscopy.  D/w pt r/b/a of surgery.    Pertinent Gynecological History: Pap 03/08/15 Pap WNL HR HPV + Sexually active G2P2, SVD x 2, no complication, female x 2 No STDs  Mentrual History:  No LMP recorded. Patient is not currently having periods (Reason: Irregular Periods).    Past Medical History  Diagnosis Date  . 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 Migraine   . Ovarian cyst   . 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 Pelvic pain in female 12/05/2015  . Uterine polyp 12/05/2015    Past Surgical History  Procedure Laterality Date  . Carotid aneurysm s/p coil treatment      Currently OFF plavix  . Ovarian cyst surgery  2001  . Laparoscopy  2001    Family History  Problem Relation Age of Onset  . Leukemia Mother   . Cancer Mother 63    leukemia  . Lung cancer Father     SMOKER  . Cancer Father 72    lung ca  . Heart disease Other   . Stroke Other   . Alcohol abuse Other   . Breast cancer Other     Social History:  reports that she has never smoked. She has never used smokeless tobacco. She reports that she does not drink alcohol or use illicit drugs.  Married, Admin Asst - [employer]  Allergies: No Known Allergies  Meds: ASA, hycosamine, MVI, B12, D2    Review of Systems  Constitutional: Negative.   HENT: Negative.   Eyes: Negative.   Respiratory: Negative.   Cardiovascular: Negative.   Gastrointestinal: Positive for abdominal pain.       Pelvic pain  Genitourinary: Negative.   Musculoskeletal: Negative.   Skin: Negative.    Neurological: Negative.   Psychiatric/Behavioral: Negative.     There were no vitals taken for this visit. Physical Exam  Constitutional: She is oriented to person, place, and time. She appears well-developed and well-nourished.  HENT:  Head: Normocephalic and atraumatic.  Cardiovascular: Normal rate and regular rhythm.   Respiratory: Effort normal and breath sounds normal. No respiratory distress. She has no wheezes.  GI: Soft. Bowel sounds are normal. She exhibits no distension. There is no tenderness.  Musculoskeletal: Normal range of motion.  Neurological: She is alert and oriented to person, place, and time.  Skin: Skin is warm and dry.  Psychiatric: She has a normal mood and affect. Her behavior is normal.   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  D/w pt, voices understandin   Bovard-Stuckert, Linda Stewart 12/05/2015, 9:22 PM

## 2015-12-06 ENCOUNTER — Encounter (HOSPITAL_COMMUNITY): Admission: RE | Payer: Self-pay | Source: Ambulatory Visit

## 2015-12-06 ENCOUNTER — Ambulatory Visit (HOSPITAL_COMMUNITY)
Admission: RE | Admit: 2015-12-06 | Payer: BC Managed Care – PPO | Source: Ambulatory Visit | Admitting: Obstetrics and Gynecology

## 2015-12-06 HISTORY — DX: Polyp of corpus uteri: N84.0

## 2015-12-06 HISTORY — DX: Pelvic and perineal pain: R10.2

## 2015-12-06 SURGERY — DILATATION AND CURETTAGE /HYSTEROSCOPY
Anesthesia: Choice

## 2015-12-26 ENCOUNTER — Telehealth: Payer: Self-pay | Admitting: Internal Medicine

## 2015-12-26 NOTE — Telephone Encounter (Signed)
Paperwork received and placed on MD's desk for signature.

## 2015-12-26 NOTE — Telephone Encounter (Signed)
Roselyn Reef from [place] will be faxing a medical clearance for patient. She says she sent one over a couple weeks ago, so I asked her to refax it

## 2015-12-31 ENCOUNTER — Telehealth: Payer: Self-pay | Admitting: Internal Medicine

## 2015-12-31 NOTE — Telephone Encounter (Signed)
States has faxed two surgery clearances over.  Would like to know if they were received and when it can be sent back.

## 2016-01-01 NOTE — Telephone Encounter (Signed)
I don't have it Thx 

## 2016-01-01 NOTE — Telephone Encounter (Signed)
See [id number] duplicate phone note.

## 2016-01-15 NOTE — Telephone Encounter (Signed)
I just received the surgical clearance form from [place] today. It is in PCP's red folder for review/signature.

## 2016-01-15 NOTE — Telephone Encounter (Signed)
Roselyn Reef called in to check the status of clearance forms faxed over. Informed her that provider hasn't received per previous message. Confirmed fax #. She says that she will refax .

## 2016-02-20 ENCOUNTER — Encounter (HOSPITAL_BASED_OUTPATIENT_CLINIC_OR_DEPARTMENT_OTHER): Payer: Self-pay | Admitting: *Deleted

## 2016-02-20 NOTE — Progress Notes (Signed)
NPO AFTER MN.  ARRIVE AT 0700.  NEEDS CBC AND URINE PREG.

## 2016-02-27 NOTE — H&P (Signed)
Linda Stewart is an 45 y.o. female  With uterine polyp on [place].  Also pelvic pain, chronic.  Pt for hysteroscopy, D&C and diagnostic laparoscopy.  D/W pt r/b/a - will proceed.  Pt with multiple medical problems cleared by PCP for surgery.  Pertinent Gynecological History: OB History: G2, P2 SVD x2 Last MMG 03/08/15 Last pap 03/08/15, H RHPV + No STDs [place] - thickeend EMS   Menstrual History:  Patient's last menstrual period was 02/13/2016.    Past Medical History  Diagnosis Date  . 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 Pelvic pain in female 12/05/2015  . Uterine polyp 12/05/2015  . History of CVA (cerebrovascular accident)     2003  . 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 History of ovarian cyst   . 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 Laparoscopic ovarian cystectomy  2001  . 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 Leukemia Mother   . Cancer Mother 61    leukemia  . Lung cancer Father     SMOKER  . Cancer Father 51    lung ca  . Heart disease Other   . Stroke Other   . Alcohol abuse Other   . Breast cancer Other     Social History:  reports that she has never smoked. She has never used smokeless tobacco. She reports that she does not drink alcohol or use illicit drugs.secretary Statistician, married  Allergies: No Known Allergies  Meds: ASA, chlorodiazepoxide-clindinium, hycosamine, MVI, Vit D and Vit B12    Review of Systems  Constitutional: Negative.   HENT: Negative.   Eyes: Negative.   Respiratory: Negative.   Cardiovascular: Negative.   Gastrointestinal:  Positive for abdominal pain.       Occ  Genitourinary: Negative.   Musculoskeletal: Negative.   Skin: Negative.   Neurological: Negative.   Psychiatric/Behavioral: Negative.     Height 5\' 2"  (1.575 m), weight 60.328 kg (133 lb), last menstrual period 02/13/2016. Physical Exam  Constitutional: She is oriented to person, place, and time. She appears well-developed and well-nourished.  HENT:  Head: Normocephalic and atraumatic.  Cardiovascular: Normal rate and regular rhythm.   Respiratory: Effort normal and breath sounds normal. No respiratory distress. She has no wheezes.  GI: Soft. Bowel sounds are normal. She exhibits no distension. There is no tenderness.  Musculoskeletal: Normal range of motion.  Neurological: She is alert and oriented to person, place, and time.  Skin: Skin is warm and dry.  Psychiatric: She has a normal mood and affect. Her behavior is normal.    [place] polyp in middle uterine wall.  Nl ovaries.  Assessment/Plan: 45yo G2P2 with uterine polyp and pelvic pain - for hysteroscopy/D&C, diagnostic L/S Medical clearance D/w pt r/b/a of surgical procedure Will proceed   Bovard-Stuckert, [place] 02/27/2016, 9:44 PM

## 2016-02-28 ENCOUNTER — Ambulatory Visit (HOSPITAL_BASED_OUTPATIENT_CLINIC_OR_DEPARTMENT_OTHER): Payer: BC Managed Care – PPO | Admitting: Anesthesiology

## 2016-02-28 ENCOUNTER — Encounter (HOSPITAL_BASED_OUTPATIENT_CLINIC_OR_DEPARTMENT_OTHER)
Admission: RE | Disposition: A | Discharge status: Home / Self Care | Payer: Self-pay | Source: Ambulatory Visit | Attending: Obstetrics and Gynecology

## 2016-02-28 ENCOUNTER — Encounter (HOSPITAL_BASED_OUTPATIENT_CLINIC_OR_DEPARTMENT_OTHER): Payer: Self-pay | Admitting: *Deleted

## 2016-02-28 ENCOUNTER — Ambulatory Visit (HOSPITAL_BASED_OUTPATIENT_CLINIC_OR_DEPARTMENT_OTHER)
Admission: RE | Admit: 2016-02-28 | Discharge: 2016-02-28 | Disposition: A | Discharge status: Home / Self Care | Payer: BC Managed Care – PPO | Source: Ambulatory Visit | Attending: Obstetrics and Gynecology | Admitting: Obstetrics and Gynecology

## 2016-02-28 DIAGNOSIS — Z8673 Personal history of transient ischemic attack (TIA), and cerebral infarction without residual deficits: Secondary | ICD-10-CM | POA: Insufficient documentation

## 2016-02-28 DIAGNOSIS — N800 Endometriosis of uterus: Secondary | ICD-10-CM | POA: Diagnosis not present

## 2016-02-28 DIAGNOSIS — N840 Polyp of corpus uteri: Secondary | ICD-10-CM | POA: Diagnosis present

## 2016-02-28 DIAGNOSIS — Q048 Other specified congenital malformations of brain: Secondary | ICD-10-CM | POA: Insufficient documentation

## 2016-02-28 DIAGNOSIS — Z7982 Long term (current) use of aspirin: Secondary | ICD-10-CM | POA: Insufficient documentation

## 2016-02-28 DIAGNOSIS — N809 Endometriosis, unspecified: Secondary | ICD-10-CM | POA: Diagnosis present

## 2016-02-28 DIAGNOSIS — N736 Female pelvic peritoneal adhesions (postinfective): Secondary | ICD-10-CM | POA: Diagnosis not present

## 2016-02-28 HISTORY — DX: Personal history of other diseases of the female genital tract: Z87.42

## 2016-02-28 HISTORY — PX: HYSTEROSCOPY WITH D & C: SHX1775

## 2016-02-28 HISTORY — DX: Personal history of transient ischemic attack (TIA), and cerebral infarction without residual deficits: Z86.73

## 2016-02-28 HISTORY — PX: LAPAROSCOPY: SHX197

## 2016-02-28 HISTORY — DX: Endometriosis, unspecified: N80.9

## 2016-02-28 HISTORY — DX: Personal history of other diseases of the circulatory system: Z86.79

## 2016-02-28 HISTORY — DX: Personal history of other diseases of the circulatory system: Z98.890

## 2016-02-28 LAB — CBC
HEMATOCRIT: 37.7 % (ref 36.0–46.0)
HEMOGLOBIN: 12.5 g/dL (ref 12.0–15.0)
MCH: 24.9 pg — ABNORMAL LOW (ref 26.0–34.0)
MCHC: 33.2 g/dL (ref 30.0–36.0)
MCV: 75 fL — ABNORMAL LOW (ref 78.0–100.0)
Platelets: 235 10*3/uL (ref 150–400)
RBC: 5.03 MIL/uL (ref 3.87–5.11)
RDW: 14.1 % (ref 11.5–15.5)
WBC: 2.6 10*3/uL — AB (ref 4.0–10.5)

## 2016-02-28 LAB — POCT PREGNANCY, URINE: PREG TEST UR: NEGATIVE

## 2016-02-28 SURGERY — DILATATION AND CURETTAGE /HYSTEROSCOPY
Anesthesia: General

## 2016-02-28 MED ORDER — OXYCODONE HCL 5 MG PO TABS
5.0000 mg | ORAL_TABLET | Freq: Once | ORAL | Status: AC | PRN
  Administered 2016-02-28: 5 mg via ORAL
  Filled 2016-02-28: qty 1

## 2016-02-28 MED ORDER — GLYCOPYRROLATE 0.2 MG/ML IJ SOLN
INTRAMUSCULAR | Status: DC | PRN
  Administered 2016-02-28: 0.2 mg via INTRAVENOUS

## 2016-02-28 MED ORDER — HYDROMORPHONE HCL 1 MG/ML IJ SOLN
0.2500 mg | INTRAMUSCULAR | Status: DC | PRN
  Filled 2016-02-28: qty 1

## 2016-02-28 MED ORDER — MENTHOL 3 MG MT LOZG
LOZENGE | OROMUCOSAL | Status: AC
  Filled 2016-02-28: qty 9

## 2016-02-28 MED ORDER — MEPERIDINE HCL 25 MG/ML IJ SOLN
6.2500 mg | INTRAMUSCULAR | Status: DC | PRN
  Filled 2016-02-28: qty 1

## 2016-02-28 MED ORDER — IBUPROFEN 800 MG PO TABS: 800.0000 mg | ORAL_TABLET | Freq: Three times a day (TID) | ORAL | Status: DC | PRN

## 2016-02-28 MED ORDER — PROPOFOL 10 MG/ML IV BOLUS
INTRAVENOUS | Status: AC
  Filled 2016-02-28: qty 20

## 2016-02-28 MED ORDER — PROPOFOL 10 MG/ML IV BOLUS
INTRAVENOUS | Status: DC | PRN
  Administered 2016-02-28: 30 mg via INTRAVENOUS
  Administered 2016-02-28: 160 mg via INTRAVENOUS

## 2016-02-28 MED ORDER — LACTATED RINGERS IV SOLN
INTRAVENOUS | Status: DC
  Filled 2016-02-28: qty 1000

## 2016-02-28 MED ORDER — LIDOCAINE HCL (CARDIAC) 20 MG/ML IV SOLN
INTRAVENOUS | Status: AC
Start: 2016-02-28 — End: 2016-02-28
  Filled 2016-02-28: qty 5

## 2016-02-28 MED ORDER — NEOSTIGMINE METHYLSULFATE 10 MG/10ML IV SOLN
INTRAVENOUS | Status: DC | PRN
  Administered 2016-02-28: 2 mg via INTRAVENOUS

## 2016-02-28 MED ORDER — FENTANYL CITRATE (PF) 100 MCG/2ML IJ SOLN
INTRAMUSCULAR | Status: AC
  Filled 2016-02-28: qty 2

## 2016-02-28 MED ORDER — LIDOCAINE HCL (CARDIAC) 20 MG/ML IV SOLN
INTRAVENOUS | Status: DC | PRN
  Administered 2016-02-28: 50 mg via INTRAVENOUS

## 2016-02-28 MED ORDER — FENTANYL CITRATE (PF) 100 MCG/2ML IJ SOLN
INTRAMUSCULAR | Status: DC | PRN
  Administered 2016-02-28: 25 ug via INTRAVENOUS
  Administered 2016-02-28 (×2): 50 ug via INTRAVENOUS

## 2016-02-28 MED ORDER — OXYCODONE HCL 5 MG/5ML PO SOLN
5.0000 mg | Freq: Once | ORAL | Status: AC | PRN
  Filled 2016-02-28: qty 5

## 2016-02-28 MED ORDER — LIDOCAINE HCL 1 % IJ SOLN
INTRAMUSCULAR | Status: DC | PRN
  Administered 2016-02-28: 10 mL

## 2016-02-28 MED ORDER — MIDAZOLAM HCL 5 MG/5ML IJ SOLN
INTRAMUSCULAR | Status: DC | PRN
  Administered 2016-02-28: 2 mg via INTRAVENOUS

## 2016-02-28 MED ORDER — GLYCINE 1.5 % IR SOLN
Status: DC | PRN
  Administered 2016-02-28: 3000 mL

## 2016-02-28 MED ORDER — ONDANSETRON HCL 4 MG/2ML IJ SOLN
INTRAMUSCULAR | Status: AC
  Filled 2016-02-28: qty 2

## 2016-02-28 MED ORDER — OXYCODONE-ACETAMINOPHEN 5-325 MG PO TABS: 1.0000 | ORAL_TABLET | Freq: Four times a day (QID) | ORAL | Status: DC | PRN

## 2016-02-28 MED ORDER — LACTATED RINGERS IV SOLN
INTRAVENOUS | Status: DC
  Administered 2016-02-28: 07:00:00 via INTRAVENOUS
  Filled 2016-02-28: qty 1000

## 2016-02-28 MED ORDER — SODIUM CHLORIDE 0.9 % IR SOLN
Status: DC | PRN
  Administered 2016-02-28: 500 mL

## 2016-02-28 MED ORDER — MIDAZOLAM HCL 2 MG/2ML IJ SOLN
INTRAMUSCULAR | Status: AC
  Filled 2016-02-28: qty 2

## 2016-02-28 MED ORDER — ONDANSETRON HCL 4 MG/2ML IJ SOLN
INTRAMUSCULAR | Status: DC | PRN
  Administered 2016-02-28: 4 mg via INTRAVENOUS

## 2016-02-28 MED ORDER — ROCURONIUM BROMIDE 100 MG/10ML IV SOLN
INTRAVENOUS | Status: DC | PRN
  Administered 2016-02-28: 10 mg via INTRAVENOUS
  Administered 2016-02-28: 25 mg via INTRAVENOUS

## 2016-02-28 MED ORDER — ROCURONIUM BROMIDE 100 MG/10ML IV SOLN
INTRAVENOUS | Status: AC
  Filled 2016-02-28: qty 1

## 2016-02-28 MED ORDER — KETOROLAC TROMETHAMINE 30 MG/ML IJ SOLN
INTRAMUSCULAR | Status: DC | PRN
  Administered 2016-02-28: 30 mg via INTRAVENOUS

## 2016-02-28 MED ORDER — BUPIVACAINE HCL (PF) 0.25 % IJ SOLN
INTRAMUSCULAR | Status: DC | PRN
  Administered 2016-02-28: 10 mL

## 2016-02-28 MED ORDER — DEXAMETHASONE SODIUM PHOSPHATE 10 MG/ML IJ SOLN
INTRAMUSCULAR | Status: AC
  Filled 2016-02-28: qty 1

## 2016-02-28 MED ORDER — PROMETHAZINE HCL 25 MG/ML IJ SOLN
6.2500 mg | INTRAMUSCULAR | Status: DC | PRN
  Filled 2016-02-28: qty 1

## 2016-02-28 MED ORDER — KETOROLAC TROMETHAMINE 30 MG/ML IJ SOLN
INTRAMUSCULAR | Status: AC
  Filled 2016-02-28: qty 1

## 2016-02-28 MED ORDER — OXYCODONE HCL 5 MG PO TABS
ORAL_TABLET | ORAL | Status: AC
  Filled 2016-02-28: qty 1

## 2016-02-28 MED ORDER — CEFAZOLIN SODIUM 1-5 GM-% IV SOLN
INTRAVENOUS | Status: AC
  Filled 2016-02-28: qty 100

## 2016-02-28 MED ORDER — DEXAMETHASONE SODIUM PHOSPHATE 4 MG/ML IJ SOLN
INTRAMUSCULAR | Status: DC | PRN
  Administered 2016-02-28: 10 mg via INTRAVENOUS

## 2016-02-28 MED ORDER — CEFAZOLIN SODIUM 1 G IJ SOLR
2.0000 g | INTRAMUSCULAR | Status: AC
  Administered 2016-02-28: 2 g via INTRAVENOUS
  Filled 2016-02-28: qty 20

## 2016-02-28 SURGICAL SUPPLY — 80 items
APL SKNCLS STERI-STRIP NONHPOA (GAUZE/BANDAGES/DRESSINGS)
APPLICATOR COTTON TIP 6IN STRL (MISCELLANEOUS) ×1 IMPLANT
APPLIER CLIP LOGIC TI 5 (MISCELLANEOUS) ×1 IMPLANT
APR CLP MED LRG 33X5 (MISCELLANEOUS)
BAG SPEC RTRVL LRG 6X4 10 (ENDOMECHANICALS)
BANDAGE ADHESIVE 1X3 (GAUZE/BANDAGES/DRESSINGS) IMPLANT
BENZOIN TINCTURE PRP APPL 2/3 (GAUZE/BANDAGES/DRESSINGS) ×1 IMPLANT
BLADE SURG 11 STRL SS (BLADE) ×3 IMPLANT
CANISTER SUCTION 1200CC (MISCELLANEOUS) IMPLANT
CANISTER SUCTION 2500CC (MISCELLANEOUS) ×5 IMPLANT
CATH ROBINSON RED A/P 16FR (CATHETERS) ×1 IMPLANT
CHLORAPREP W/TINT 26ML (MISCELLANEOUS) ×3 IMPLANT
CLOSURE WOUND 1/4X4 (GAUZE/BANDAGES/DRESSINGS)
COVER BACK TABLE 60X90IN (DRAPES) ×3 IMPLANT
DRAPE HYSTEROSCOPY (DRAPE) ×3 IMPLANT
DRAPE LG THREE QUARTER DISP (DRAPES) ×3 IMPLANT
DRAPE UNDERBUTTOCKS STRL (DRAPE) ×3 IMPLANT
DRSG OPSITE POSTOP 3X4 (GAUZE/BANDAGES/DRESSINGS) IMPLANT
DRSG TELFA 3X8 NADH (GAUZE/BANDAGES/DRESSINGS) ×3 IMPLANT
ELECT REM PT RETURN 9FT ADLT (ELECTROSURGICAL) ×3
ELECTRODE REM PT RTRN 9FT ADLT (ELECTROSURGICAL) ×1 IMPLANT
GAS CARTRIDGE (MEDICAL GASES) IMPLANT
GLOVE BIO SURGEON STRL SZ 6.5 (GLOVE) ×4 IMPLANT
GLOVE BIO SURGEONS STRL SZ 6.5 (GLOVE) ×2
GLOVE BIOGEL PI IND STRL 7.0 (GLOVE) ×1 IMPLANT
GLOVE BIOGEL PI INDICATOR 7.0 (GLOVE) ×2
GOWN STRL REUS W/ TWL LRG LVL3 (GOWN DISPOSABLE) ×1 IMPLANT
GOWN STRL REUS W/TWL LRG LVL3 (GOWN DISPOSABLE) ×3
KIT ROOM TURNOVER WOR (KITS) ×3 IMPLANT
LEGGING LITHOTOMY PAIR STRL (DRAPES) ×3 IMPLANT
LIQUID BAND (GAUZE/BANDAGES/DRESSINGS) ×2 IMPLANT
LOOP ANGLED CUTTING 22FR (CUTTING LOOP) IMPLANT
NDL HYPO 25X1 1.5 SAFETY (NEEDLE) IMPLANT
NDL INSUFFLATION 14GA 120MM (NEEDLE) ×1 IMPLANT
NDL INSUFFLATION 14GA 150MM (NEEDLE) IMPLANT
NEEDLE HYPO 25X1 1.5 SAFETY (NEEDLE) ×3 IMPLANT
NEEDLE INSUFFLATION 14GA 120MM (NEEDLE) ×3 IMPLANT
NEEDLE INSUFFLATION 14GA 150MM (NEEDLE) IMPLANT
NS IRRIG 500ML POUR BTL (IV SOLUTION) ×2 IMPLANT
PACK BASIN DAY SURGERY FS (CUSTOM PROCEDURE TRAY) ×3 IMPLANT
PACK LAPAROSCOPY II (CUSTOM PROCEDURE TRAY) ×3 IMPLANT
PAD DRESSING TELFA 3X8 NADH (GAUZE/BANDAGES/DRESSINGS) ×1 IMPLANT
PAD ION LEFT ARM DISP (MISCELLANEOUS) ×3 IMPLANT
PAD OB MATERNITY 4.3X12.25 (PERSONAL CARE ITEMS) ×3 IMPLANT
PAD PREP 24X48 CUFFED NSTRL (MISCELLANEOUS) ×3 IMPLANT
PADDING ION DISPOSABLE (MISCELLANEOUS) ×3 IMPLANT
POUCH SPECIMEN RETRIEVAL 10MM (ENDOMECHANICALS) IMPLANT
SCALPEL HARMONIC ACE (MISCELLANEOUS) ×2 IMPLANT
SCISSORS LAP 5X35 DISP (ENDOMECHANICALS) IMPLANT
SET IRRIG TUBING LAPAROSCOPIC (IRRIGATION / IRRIGATOR) IMPLANT
SHEARS HARMONIC ACE PLUS 36CM (ENDOMECHANICALS) IMPLANT
SOLUTION ANTI FOG 6CC (MISCELLANEOUS) ×3 IMPLANT
SOLUTION ELECTROLUBE (MISCELLANEOUS) ×1 IMPLANT
STRIP CLOSURE SKIN 1/4X4 (GAUZE/BANDAGES/DRESSINGS) IMPLANT
SUT VICRYL 0 UR6 27IN ABS (SUTURE) ×2 IMPLANT
SUT VICRYL 4-0 PS2 18IN ABS (SUTURE) ×5 IMPLANT
SYR 20CC LL (SYRINGE) IMPLANT
SYR 3ML 23GX1 SAFETY (SYRINGE) IMPLANT
SYR CONTROL 10ML LL (SYRINGE) ×4 IMPLANT
SYRINGE 10CC LL (SYRINGE) ×4 IMPLANT
TOWEL OR 17X24 6PK STRL BLUE (TOWEL DISPOSABLE) ×6 IMPLANT
TRAY DSU PREP LF (CUSTOM PROCEDURE TRAY) ×3 IMPLANT
TRAY FOLEY BAG SILVER LF 14FR (CATHETERS) ×3 IMPLANT
TROCAR 12M 150ML BLUNT (TROCAR) ×2 IMPLANT
TROCAR 5M 150ML BLDLS (TROCAR) IMPLANT
TROCAR BLADELESS OPT 5 100 (ENDOMECHANICALS) ×2 IMPLANT
TROCAR CANNULA 5X100MM C0Q10 (TROCAR) IMPLANT
TROCAR OPTI TIP 5M 100M (ENDOMECHANICALS) ×2 IMPLANT
TROCAR XCEL BLUNT TIP 100MML (ENDOMECHANICALS) IMPLANT
TROCAR XCEL NON-BLD 11X100MML (ENDOMECHANICALS) ×1 IMPLANT
TROCAR XCEL NON-BLD 5MMX100MML (ENDOMECHANICALS) IMPLANT
TROCAR XCEL OPT SLVE 5M 100M (ENDOMECHANICALS) ×1 IMPLANT
TROCAR Z-THREAD FIOS 5X100MM (TROCAR) ×1 IMPLANT
TUBE CONNECTING 12'X1/4 (SUCTIONS) ×1
TUBE CONNECTING 12X1/4 (SUCTIONS) ×1 IMPLANT
TUBING AQUILEX INFLOW (TUBING) ×3 IMPLANT
TUBING AQUILEX OUTFLOW (TUBING) ×3 IMPLANT
TUBING INSUFFLATION 10FT LAP (TUBING) ×3 IMPLANT
WARMER LAPAROSCOPE (MISCELLANEOUS) ×3 IMPLANT
WATER STERILE IRR 500ML POUR (IV SOLUTION) ×3 IMPLANT

## 2016-02-28 NOTE — Transfer of Care (Signed)
Immediate Anesthesia Transfer of Care Note  Patient: Linda Stewart  Procedure(s) Performed: Procedure(s): DILATATION AND CURETTAGE /DIAGNOSTIC HYSTEROSCOPY (N/A) OPERATIVE LAPAROSCOPY; LYSIS OF ADHESIONS (N/A)  Patient Location: PACU  Anesthesia Type:General  Level of Consciousness: awake, alert  and oriented  Airway & Oxygen Therapy: Patient Spontanous Breathing and Patient connected to nasal cannula oxygen  Post-op Assessment: Report given to RN  Post vital signs: Reviewed and stable  Last Vitals: 113/47, 84, 14, 100% Filed Vitals:   02/28/16 0618  BP: 141/60  Pulse: 88  Temp: 37.1 C  Resp: 16    Last Pain: There were no vitals filed for this visit.    Patients Stated Pain Goal: 5 ([phone])  Complications: No apparent anesthesia complications

## 2016-02-28 NOTE — Anesthesia Postprocedure Evaluation (Signed)
Anesthesia Post Note  Patient: Linda Stewart  Procedure(s) Performed: Procedure(s) (LRB): DILATATION AND CURETTAGE /DIAGNOSTIC HYSTEROSCOPY (N/A) OPERATIVE LAPAROSCOPY; LYSIS OF ADHESIONS (N/A)  Patient location during evaluation: PACU Anesthesia Type: General Level of consciousness: awake and alert Pain management: pain level controlled Vital Signs Assessment: post-procedure vital signs reviewed and stable Respiratory status: spontaneous breathing, nonlabored ventilation, respiratory function stable and patient connected to nasal cannula oxygen Cardiovascular status: blood pressure returned to baseline and stable Postop Assessment: no signs of nausea or vomiting Anesthetic complications: no    Last Vitals:  Filed Vitals:   02/28/16 1015 02/28/16 1030  BP: 116/51 117/50  Pulse: 93 87  Temp:    Resp: 17 15    Last Pain:  Filed Vitals:   02/28/16 1035  PainSc: 2                  Effie Berkshire

## 2016-02-28 NOTE — Interval H&P Note (Signed)
History and Physical Interval Note:  02/28/2016 7:15 AM  Linda Stewart  has presented today for surgery, with the diagnosis of Uterine Polyp  The various methods of treatment have been discussed with the patient and family. After consideration of risks, benefits and other options for treatment, the patient has consented to  Procedure(s): DILATATION AND CURETTAGE /HYSTEROSCOPY (N/A) LAPAROSCOPY DIAGNOSTIC (N/A) as a surgical intervention .  The patient's history has been reviewed, patient examined, no change in status, stable for surgery.  I have reviewed the patient's chart and labs.  Questions were answered to the patient's satisfaction.     Bovard-Stuckert, Aris Moman

## 2016-02-28 NOTE — Anesthesia Preprocedure Evaluation (Addendum)
Anesthesia Evaluation  Patient identified by MRN, date of birth, ID band Patient awake    Reviewed: Allergy & Precautions, NPO status , Patient's Chart, lab work & pertinent test results  Airway Mallampati: I  TM Distance: >3 FB Neck ROM: Full    Dental  (+) Teeth Intact, Dental Advisory Given   Pulmonary    breath sounds clear to auscultation       Cardiovascular negative cardio ROS   Rhythm:Regular Rate:Normal     Neuro/Psych  Headaches,  Neuromuscular disease negative psych ROS   GI/Hepatic negative GI ROS, Neg liver ROS,   Endo/Other  Hyperthyroidism   Renal/GU negative Renal ROS  negative genitourinary   Musculoskeletal negative musculoskeletal ROS (+)   Abdominal   Peds negative pediatric ROS (+)  Hematology negative hematology ROS (+)   Anesthesia Other Findings - 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   Dental advisory given  Plan Discussed with: CRNA  Anesthesia Plan Comments:        Anesthesia Quick Evaluation

## 2016-02-28 NOTE — Brief Op Note (Signed)
02/28/2016  8:54 AM  PATIENT:  Linda Stewart  45 y.o. female  PRE-OPERATIVE DIAGNOSIS:  Uterine Polyp, pelvic pain   POST-OPERATIVE DIAGNOSIS:  Endometriosis, pelvic adhesions, uterine polyp, pelvic pain  PROCEDURE:  Procedure(s): DILATATION AND CURETTAGE /HYSTEROSCOPY (N/A) LAPAROSCOPY DIAGNOSTIC (N/A), LOA, D&C  SURGEON:  Surgeon(s) and Role:    * Janyth Contes, MD - Primary  ASSISTANTS: none   ANESTHESIA:   local and general  EBL:  Total I/O In: 700 [I.V.:700] Out: 20 [Blood:20]  BLOOD ADMINISTERED:none  DRAINS: none   LOCAL MEDICATIONS USED:  MARCAINE    and LIDOCAINE   SPECIMEN:  Source of Specimen:  endometrial currettings  DISPOSITION OF SPECIMEN:  PATHOLOGY  COUNTS:  YES  TOURNIQUET:  * No tourniquets in log *  DICTATION: .Other Dictation: Dictation Number [id number]  PLAN OF CARE: Discharge to home after PACU  PATIENT DISPOSITION:  PACU - hemodynamically stable.   Delay start of Pharmacological VTE agent (>24hrs) due to surgical blood loss or risk of bleeding: not applicable

## 2016-02-28 NOTE — Discharge Instructions (Signed)

## 2016-02-28 NOTE — Anesthesia Procedure Notes (Addendum)
Procedure Name: Intubation Date/Time: 02/28/2016 7:37 AM Performed by: Bethena Roys T Pre-anesthesia Checklist: Patient identified, Emergency Drugs available, Suction available and Patient being monitored Patient Re-evaluated:Patient Re-evaluated prior to inductionOxygen Delivery Method: Circle System Utilized Preoxygenation: Pre-oxygenation with 100% oxygen Intubation Type: IV induction Ventilation: Mask ventilation without difficulty Laryngoscope Size: Mac and 3 Grade View: Grade I Tube type: Oral Tube size: 7.0 mm Number of attempts: 1 Airway Equipment and Method: Stylet and Oral airway Placement Confirmation: ETT inserted through vocal cords under direct vision,  positive ETCO2 and breath sounds checked- equal and bilateral Secured at: 20 cm Tube secured with: Tape Dental Injury: Teeth and Oropharynx as per pre-operative assessment

## 2016-02-29 NOTE — Op Note (Signed)
NAMENELEH, MCCLASKEY NO.:  [id number]  MEDICAL RECORD NO.:  [id number]  LOCATION:                                 FACILITY:  PHYSICIAN:  Thornell Sartorius, MD        DATE OF BIRTH:  [birth year]  DATE OF PROCEDURE:  02/28/2016 DATE OF DISCHARGE:                              OPERATIVE REPORT   PREOPERATIVE DIAGNOSIS:  Uterine polyp, pelvic pain.  POSTOPERATIVE DIAGNOSIS:  Uterine polyp, pelvic pain, endometriosis and pelvic adhesions.  PROCEDURE:  Operative laparoscopy, lysis of adhesions as well as diagnostic hysteroscopy D and C.  SURGEON:  Thornell Sartorius, MD  ASSISTANT:  None.  ANESTHESIA:  Local and general.  EBL:  Approximately 20 mL.  IV FLUIDS:  700 mL clear urine at the end of the procedure.  PATHOLOGY:  Endometrial curettings.  PROCEDURE IN DETAIL:  After informed consent was reviewed with patient including risks, benefits, and alternatives of the surgical procedure, she was transferred to the OR, placed on table in supine position. General anesthesia was induced and found to be adequate.  She was then prepped and draped in the normal sterile fashion, placed in the [place] stirrups.  Using an open-sided speculum after the Foley had been placed, a Hulka tenaculum was placed and her gloves were changed.  Attention was turned to the abdominal portion of the case and approximately 10 mm vertical infraumbilical incision was made.  Using the Veress needle, pneumoperitoneum was obtained.  Uterine and pelvic adhesions were noted posterior to the uterus as well as a small powder burn of endometriosis on the left ovary, otherwise normal appendix was seen, normal liver edge, normal stomach.  Otherwise, uterus appeared normal.  After the adhesions were lysed with the Harmonic Scalpel, the trocars were removed from the abdomen under direct visualization.  The ports were all 5 mm, and they were closed with a single suture and then Dermabond.  The patient  tolerated the hysteroscopic portion of the procedure.  Using an open-sided speculum, her uterus was grasped with a single-tooth tenaculum, dilated to 21-French to accommodate the diagnostic hysteroscopy.  Brief uterine survey performed revealed thickened lining and no definite polyps, proceeded with a D and C, visualization of the uterine cavity afterwards revealed  Fluffy endometrial lining; forceps were also used to remove any tissue.  The instruments were removed from the vagina.  Patient was awakened in stable condition, transferred to the PACU following counts that were confirmed x2 to be accurate.     Thornell Sartorius, MD   ______________________________ Thornell Sartorius, MD    JB/MEDQ  D:  02/28/2016  T:  02/29/2016  Job:  [id number]

## 2016-02-29 NOTE — Op Note (Deleted)
NAMETONIETTE, STIGER NO.:  [id number]  MEDICAL RECORD NO.:  [id number]  LOCATION:                                 FACILITY:  PHYSICIAN:  Thornell Sartorius, MD        DATE OF BIRTH:  07-14-1971  DATE OF PROCEDURE:  02/28/2016 DATE OF DISCHARGE:                              OPERATIVE REPORT   PREOPERATIVE DIAGNOSIS:  Uterine polyp, pelvic pain.  POSTOPERATIVE DIAGNOSIS:  Uterine polyp, pelvic pain, endometriosis and pelvic adhesions.  PROCEDURE:  Operative laparoscopy, lysis of adhesions as well as diagnostic hysteroscopy D and C.  SURGEON:  Thornell Sartorius, MD  ASSISTANT:  None.  ANESTHESIA:  Local and general.  EBL:  Approximately 20 mL.  IV FLUIDS:  700 mL clear urine at the end of the procedure.  PATHOLOGY:  Endometrial curettings.  PROCEDURE IN DETAIL:  After informed consent was reviewed with patient including risks, benefits, and alternatives of the surgical procedure, she was transferred to the OR, placed on table in supine position. General anesthesia was induced and found to be adequate.  She was then prepped and draped in the normal sterile fashion, placed in the [place] stirrups.  Using an open-sided speculum after the Foley had been placed, a Hulka tenaculum was placed and her gloves were changed.  Attention was turned to the abdominal portion of the case and approximately 10 mm vertical infraumbilical incision was made.  Using the Veress needle, pneumoperitoneum was obtained.  Uterine and pelvic adhesions were noted posterior to the uterus as well as a small powder burn of endometriosis on the left ovary, otherwise normal appendix was seen, normal liver edge, normal stomach.  Otherwise, uterus appeared normal.  After the adhesions were lysed with the Harmonic Scalpel, the trocars were removed from the abdomen under direct visualization.  The ports were all 5 mm, and they were closed with a single suture and then Dermabond.  The patient  tolerated the hysteroscopic portion of the procedure.  Using an open-sided speculum, her uterus was grasped with a single-tooth tenaculum, dilated to 21-French to accommodate the diagnostic hysteroscopy.  Brief uterine survey performed revealed thickened lining and no definite polyps, proceeded with a D and C, visualization of the uterine cavity afterwards revealed  __________ forceps were also used to remove any tissue.  The instruments were removed from the vagina.  Patient was awakened in stable condition, transferred to the PACU following counts that were confirmed x2 to be accurate.     Thornell Sartorius, MD   ______________________________ Thornell Sartorius, MD    JB/MEDQ  D:  02/28/2016  T:  02/29/2016  Job:  [id number]

## 2016-03-03 ENCOUNTER — Encounter (HOSPITAL_BASED_OUTPATIENT_CLINIC_OR_DEPARTMENT_OTHER): Payer: Self-pay | Admitting: Obstetrics and Gynecology

## 2016-08-17 ENCOUNTER — Encounter: Payer: Self-pay | Admitting: Internal Medicine

## 2016-08-17 ENCOUNTER — Other Ambulatory Visit (INDEPENDENT_AMBULATORY_CARE_PROVIDER_SITE_OTHER): Payer: BC Managed Care – PPO

## 2016-08-17 ENCOUNTER — Ambulatory Visit (INDEPENDENT_AMBULATORY_CARE_PROVIDER_SITE_OTHER): Payer: BC Managed Care – PPO | Admitting: Internal Medicine

## 2016-08-17 DIAGNOSIS — E538 Deficiency of other specified B group vitamins: Secondary | ICD-10-CM | POA: Diagnosis not present

## 2016-08-17 DIAGNOSIS — N840 Polyp of corpus uteri: Secondary | ICD-10-CM | POA: Diagnosis not present

## 2016-08-17 DIAGNOSIS — R634 Abnormal weight loss: Secondary | ICD-10-CM

## 2016-08-17 DIAGNOSIS — E0500 Thyrotoxicosis with diffuse goiter without thyrotoxic crisis or storm: Secondary | ICD-10-CM

## 2016-08-17 DIAGNOSIS — R5383 Other fatigue: Secondary | ICD-10-CM

## 2016-08-17 LAB — HEPATIC FUNCTION PANEL
ALBUMIN: 4.2 g/dL (ref 3.5–5.2)
ALT: 27 U/L (ref 0–35)
AST: 21 U/L (ref 0–37)
Alkaline Phosphatase: 101 U/L (ref 39–117)
BILIRUBIN TOTAL: 0.3 mg/dL (ref 0.2–1.2)
Bilirubin, Direct: 0.1 mg/dL (ref 0.0–0.3)
Total Protein: 7.4 g/dL (ref 6.0–8.3)

## 2016-08-17 LAB — CBC WITH DIFFERENTIAL/PLATELET
BASOS PCT: 0.9 % (ref 0.0–3.0)
Basophils Absolute: 0 10*3/uL (ref 0.0–0.1)
EOS PCT: 1.8 % (ref 0.0–5.0)
Eosinophils Absolute: 0.1 10*3/uL (ref 0.0–0.7)
HCT: 36.5 % (ref 36.0–46.0)
Hemoglobin: 12.1 g/dL (ref 12.0–15.0)
LYMPHS ABS: 2 10*3/uL (ref 0.7–4.0)
Lymphocytes Relative: 50 % — ABNORMAL HIGH (ref 12.0–46.0)
MCHC: 33.3 g/dL (ref 30.0–36.0)
MCV: 74.8 fl — AB (ref 78.0–100.0)
MONO ABS: 0.6 10*3/uL (ref 0.1–1.0)
MONOS PCT: 14.9 % — AB (ref 3.0–12.0)
NEUTROS ABS: 1.3 10*3/uL — AB (ref 1.4–7.7)
NEUTROS PCT: 32.4 % — AB (ref 43.0–77.0)
PLATELETS: 235 10*3/uL (ref 150.0–400.0)
RBC: 4.88 Mil/uL (ref 3.87–5.11)
RDW: 14.8 % (ref 11.5–15.5)
WBC: 3.9 10*3/uL — ABNORMAL LOW (ref 4.0–10.5)

## 2016-08-17 LAB — BASIC METABOLIC PANEL
BUN: 20 mg/dL (ref 6–23)
CO2: 26 meq/L (ref 19–32)
Calcium: 9.4 mg/dL (ref 8.4–10.5)
Chloride: 105 mEq/L (ref 96–112)
Creatinine, Ser: 0.65 mg/dL (ref 0.40–1.20)
GFR: 105.24 mL/min (ref >60.00)
GLUCOSE: 111 mg/dL — AB (ref 70–99)
POTASSIUM: 3.9 meq/L (ref 3.5–5.1)
Sodium: 139 mEq/L (ref 135–145)

## 2016-08-17 LAB — SEDIMENTATION RATE: SED RATE: 42 mm/h — AB (ref 0–20)

## 2016-08-17 LAB — T4, FREE: FREE T4: 4.58 ng/dL — AB (ref 0.60–1.60)

## 2016-08-17 LAB — TSH: TSH: 0 u[IU]/mL — AB (ref 0.35–4.50)

## 2016-08-17 MED ORDER — PROPRANOLOL HCL 10 MG PO TABS: 10.0000 mg | ORAL_TABLET | Freq: Three times a day (TID) | ORAL | 3 refills | Status: DC

## 2016-08-17 NOTE — Assessment & Plan Note (Addendum)
Probable Labs Endocr f/u if hyperthyroid Propranolol 10-20 mg bid

## 2016-08-17 NOTE — Patient Instructions (Signed)
Hyperthyroidism Hyperthyroidism is when the thyroid is too active (overactive). Your thyroid is a large gland that is located in your neck. The thyroid helps to control how your body uses food (metabolism). When your thyroid is overactive, it produces too much of a hormone called thyroxine.  CAUSES Causes of hyperthyroidism may include:  Graves disease. This is when your immune system attacks the thyroid gland. This is the most common cause.  Inflammation of the thyroid gland.  Tumor in the thyroid gland or somewhere else.  Excessive use of thyroid medicines, including:  Prescription thyroid supplement.  Herbal supplements that mimic thyroid hormones.  Solid or fluid-filled lumps within your thyroid gland (thyroid nodules).  Excessive ingestion of iodine. RISK FACTORS  Being female.  Having a family history of thyroid conditions. SIGNS AND SYMPTOMS Signs and symptoms of hyperthyroidism may include:  Nervousness.  Inability to tolerate heat.  Unexplained weight loss.  Diarrhea.  Change in the texture of hair or skin.  Heart skipping beats or making extra beats.  Rapid heart rate.  Loss of menstruation.  Shaky hands.  Fatigue.  Restlessness.  Increased appetite.  Sleep problems.  Enlarged thyroid gland or nodules. DIAGNOSIS  Diagnosis of hyperthyroidism may include:  Medical history and physical exam.  Blood tests.  Ultrasound tests. TREATMENT Treatment may include:  Medicines to control your thyroid.  Surgery to remove your thyroid.  Radiation therapy. HOME CARE INSTRUCTIONS   Take medicines only as directed by your health care provider.  Do not use any tobacco products, including cigarettes, chewing tobacco, or electronic cigarettes. If you need help quitting, ask your health care provider.  Do not exercise or do physical activity until your health care provider approves.  Keep all follow-up appointments as directed by your health care  provider. This is important. SEEK MEDICAL CARE IF:  Your symptoms do not get better with treatment.  You have fever.  You are taking thyroid replacement medicine and you:  Have depression.  Feel mentally and physically slow.  Have weight gain. SEEK IMMEDIATE MEDICAL CARE IF:   You have decreased alertness or a change in your awareness.  You have abdominal pain.  You feel dizzy.  You have a rapid heartbeat.  You have an irregular heartbeat.   This information is not intended to replace advice given to you by your health care provider. Make sure you discuss any questions you have with your health care provider.   Document Released: 09/21/2005 Document Revised: 10/12/2014 Document Reviewed: 02/06/2014 Elsevier Interactive Patient Education 2016 [employer]  

## 2016-08-17 NOTE — Progress Notes (Signed)
Pre visit review using our clinic review tool, if applicable. No additional management support is needed unless otherwise documented below in the visit note. 

## 2016-08-17 NOTE — Addendum Note (Signed)
Addended by: Cassandria Anger on: 08/17/2016 09:46 PM   Modules accepted: Orders

## 2016-08-17 NOTE — Assessment & Plan Note (Addendum)
TSH, FT4 Probable thyrotoxicosis Endocr f/u if hyperthyroid Propranolol 10-20 mg bid

## 2016-08-17 NOTE — Progress Notes (Signed)
Subjective:  Patient ID: Linda Stewart, female    DOB: 1971-01-17  Age: 45 y.o. MRN: [id number]  CC: Weight Loss (P t concern about loosing too much weight)   HPI Linda Stewart presents for wt loss after May 2017 (after uterine polyp removal). No LOC. No fever  Outpatient Medications Prior to Visit  Medication Sig Dispense Refill  . aspirin 325 MG EC tablet Take 325 mg by mouth daily.      . Cholecalciferol (VITAMIN D3) 2000 units TABS Take 1 capsule by mouth daily.    . Cyanocobalamin (VITAMIN B-12) 500 MCG SUBL Place 1 tablet (500 mcg total) under the tongue 1 day or 1 dose. 100 tablet 3  . ibuprofen (ADVIL,MOTRIN) 800 MG tablet Take 1 tablet (800 mg total) by mouth every 8 (eight) hours as needed for moderate pain. 40 tablet 0  . oxyCODONE-acetaminophen (ROXICET) 5-325 MG tablet Take 1-2 tablets by mouth every 6 (six) hours as needed for severe pain. 15 tablet 0   No facility-administered medications prior to visit.     ROS Review of Systems  Constitutional: Positive for fatigue and unexpected weight change. Negative for activity change, appetite change and chills.  HENT: Negative for congestion, mouth sores and sinus pressure.   Eyes: Negative for photophobia, pain, discharge and visual disturbance.  Respiratory: Negative for cough, chest tightness, shortness of breath and wheezing.   Cardiovascular: Positive for palpitations. Negative for chest pain.  Gastrointestinal: Negative for abdominal pain, anal bleeding, constipation, diarrhea and nausea.  Endocrine: Positive for heat intolerance. Negative for cold intolerance, polyphagia and polyuria.  Genitourinary: Negative for difficulty urinating, frequency and vaginal pain.  Musculoskeletal: Negative for back pain and gait problem.  Skin: Negative for pallor and rash.  Neurological: Positive for tremors. Negative for dizziness, weakness, numbness and headaches.  Psychiatric/Behavioral: Negative for confusion, decreased  concentration, sleep disturbance and suicidal ideas. The patient is nervous/anxious. The patient is not hyperactive.     Objective:  BP 118/78 (BP Location: Left Arm, Patient Position: Sitting, Cuff Size: Normal)   Pulse (!) 110   Temp 97.6 F (36.4 C)   Ht 5\' 2"  (1.575 m)   Wt 122 lb (55.3 kg)   SpO2 98%   BMI 22.31 kg/m   BP Readings from Last 3 Encounters:  08/17/16 118/78  02/28/16 129/69  12/02/15 107/79    Wt Readings from Last 3 Encounters:  08/17/16 122 lb (55.3 kg)  02/28/16 129 lb (58.5 kg)  12/02/15 136 lb (61.7 kg)    Physical Exam  Constitutional: She is oriented to person, place, and time. She appears well-developed. No distress.  HENT:  Head: Normocephalic.  Right Ear: External ear normal.  Left Ear: External ear normal.  Nose: Nose normal.  Mouth/Throat: Oropharynx is clear and moist.  Eyes: Conjunctivae are normal. Pupils are equal, round, and reactive to light. Right eye exhibits no discharge. Left eye exhibits no discharge.  Neck: Normal range of motion. Neck supple. No JVD present. No tracheal deviation present. No thyromegaly present.  Cardiovascular: Normal rate, regular rhythm and normal heart sounds.   Pulmonary/Chest: No stridor. No respiratory distress. She has no wheezes.  Abdominal: Soft. Bowel sounds are normal. She exhibits no distension and no mass. There is no tenderness. There is no rebound and no guarding.  Musculoskeletal: She exhibits no edema, tenderness or deformity.  Lymphadenopathy:    She has no cervical adenopathy.  Neurological: She is alert and oriented to person, place, and time. She displays  normal reflexes. No cranial nerve deficit. She exhibits normal muscle tone. Coordination normal.  Skin: No rash noted. No erythema.  Psychiatric: She has a normal mood and affect. Her behavior is normal. Judgment and thought content normal.  Thin Mild tremor Mild NT goiter  Lab Results  Component Value Date   WBC 2.6 (L) 02/28/2016     HGB 12.5 02/28/2016   HCT 37.7 02/28/2016   PLT 235 02/28/2016   GLUCOSE 100 (H) 10/30/2015   CHOL 144 10/30/2015   TRIG 87.0 10/30/2015   HDL 57.40 10/30/2015   LDLCALC 70 10/30/2015   ALT 18 10/30/2015   AST 16 10/30/2015   NA 140 10/30/2015   K 3.9 10/30/2015   CL 104 10/30/2015   CREATININE 0.55 10/30/2015   BUN 13 10/30/2015   CO2 29 10/30/2015   TSH 0.02 (L) 10/30/2015   INR 0.99 09/22/2012    No results found.  Assessment & Plan:   There are no diagnoses linked to this encounter. I have discontinued Ms. Hogenson's oxyCODONE-acetaminophen. I am also having her maintain her aspirin, Vitamin B-12, Vitamin D3, and ibuprofen.  No orders of the defined types were placed in this encounter.    Follow-up: No Follow-up on file.  Walker Kehr, MD

## 2016-08-17 NOTE — Assessment & Plan Note (Signed)
On B12 

## 2016-08-17 NOTE — Assessment & Plan Note (Signed)
?  due to thyrotoxicosis Labs Frequent meals

## 2016-08-17 NOTE — Assessment & Plan Note (Signed)
5/17 removed - no sx's

## 2016-09-07 ENCOUNTER — Ambulatory Visit (INDEPENDENT_AMBULATORY_CARE_PROVIDER_SITE_OTHER): Payer: BC Managed Care – PPO | Admitting: Internal Medicine

## 2016-09-07 ENCOUNTER — Encounter: Payer: Self-pay | Admitting: Internal Medicine

## 2016-09-07 VITALS — BP 128/63 | HR 95 | Wt 122.0 lb

## 2016-09-07 DIAGNOSIS — E0580 Other thyrotoxicosis without thyrotoxic crisis or storm: Secondary | ICD-10-CM

## 2016-09-07 LAB — T4, FREE: FREE T4: 4.44 ng/dL — AB (ref 0.60–1.60)

## 2016-09-07 LAB — T3, FREE: T3, Free: 10.4 pg/mL — ABNORMAL HIGH (ref 2.3–4.2)

## 2016-09-07 LAB — TSH: TSH: 0.01 u[IU]/mL — AB (ref 0.35–4.50)

## 2016-09-07 NOTE — Progress Notes (Signed)
Patient ID: Linda Stewart, female   DOB: 01-23-1971, 45 y.o.   MRN: [id number]    HPI  Linda Stewart is a 45 y.o.-year-old female, returning for f/u for thyrotoxicosis. Last visit 2 years ago.   At last visit, pt presented with thyrotoxicosis, but this was improving and I advised her to return for labs in 2 months, but did not intervene at that time. Pt. Was lost for f/u.  She now returns after 2 years of persistent thyrotoxicosis, with now thyrotoxic sxs.  She was started on Propranolol 10 mg 2x a day by PCP.  I reviewed pt's thyroid tests: Lab Results  Component Value Date   TSH 0.00 (L) 08/17/2016   TSH 0.02 (L) 10/30/2015   TSH 0.19 (L) 11/20/2014   TSH 0.00 Repeated and verified X2. (L) 01/15/2014   TSH 0.02 (L) 11/23/2013   TSH 0.01 (L) 10/17/2013   TSH 3.71 09/26/2012   TSH 2.83 11/24/2011   TSH 2.57 07/10/2011   TSH 2.17 05/26/2010   FREET4 4.58 (H) 08/17/2016   FREET4 1.19 11/20/2014   FREET4 1.22 01/15/2014   FREET4 1.67 (H) 11/23/2013    Pt denies feeling nodules in neck, hoarseness, dysphagia/odynophagia, SOB with lying down; she c/o: - + weight loss - 15 lbs in 9 mo - + hyperdefecation - + tremors - worsening - + fatigue, + occasional insomnia - no excessive sweating/heat intolerance - + irritability - + anxiety, + slight depression - + palpitations - + hair loss and thinning  Pt does not have a FH of thyroid ds. No FH of thyroid cancer. No h/o radiation tx to head or neck.  No seaweed or kelp, no recent contrast studies. No steroid use. No herbal supplements. No Biotin use.  She also has a history of CVA - when in [place] (~45 y/o) - she was told at that time that her thyroid was abnormal. She also has an aneurysm - was coiled. She has ATIII deficiency, B12 def.   She is going back to [place] on 09/22/2016 for 2 weeks.  ROS: Constitutional: + see HPI Eyes: no blurry vision, no xerophthalmia ENT: no sore throat, no nodules palpated in throat, no  dysphagia/odynophagia, no hoarseness Cardiovascular: no CP/SOB/+ palpitations/no leg swelling Respiratory: no cough/SOB Gastrointestinal: no N/V/D/C Musculoskeletal: no muscle/joint aches Skin: no rashes Neurological: + tremors/no numbness/tingling/dizziness, + HAs (migraines) Psychiatric: + both: depression/anxiety  Past Medical History:  Diagnosis Date  . 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Hand)   . Endometriosis determined by laparoscopy 02/28/2016  . 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 History of cerebral aneurysm repair dx 10-18-2006 via MRI/MRA  5.53mm x 4.25mm saccular   11-16-2006  stent-assisted coiling LICA intracranial superior hypophyseal region  . History of CVA (cerebrovascular accident)    2003  . History of ovarian cyst   . Pelvic pain in female 12/05/2015  . Uterine polyp 12/05/2015   Past Surgical History:  Procedure Laterality Date  . HYSTEROSCOPY W/D&C N/A 02/28/2016   Procedure: DILATATION AND CURETTAGE /DIAGNOSTIC HYSTEROSCOPY;  Surgeon: Janyth Contes, MD;  Location: [facility];  Service: Gynecology;  Laterality: N/A;  . LAPAROSCOPIC OVARIAN CYSTECTOMY  2001  . LAPAROSCOPY N/A 02/28/2016   Procedure: OPERATIVE LAPAROSCOPY; LYSIS OF ADHESIONS;  Surgeon: Janyth Contes, MD;  Location: [facility];  Service: Gynecology;  Laterality: N/A;  . 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 Marital status: Married    Spouse name: N/A  . Number of children: 2: 73 and 47 y/o in 2017   Occupational History  . Office Support - Environmental consultant    Social History Main Topics  . Smoking status: Never Smoker  . Smokeless tobacco: Never Used  . Alcohol use No  . Drug use: Tequila, beer - occasionally   Social History Narrative   Immigrated to [place]  1999 from [place]   Current Outpatient Prescriptions on File Prior to Visit  Medication Sig Dispense Refill  . aspirin 325 MG EC tablet Take 325 mg by mouth daily.      . Cholecalciferol (VITAMIN D3) 2000 units TABS Take 1 capsule by mouth daily.    . Cyanocobalamin (VITAMIN B-12) 500 MCG SUBL Place 1 tablet (500 mcg total) under the tongue 1 day or 1 dose. 100 tablet 3  . ibuprofen (ADVIL,MOTRIN) 800 MG tablet Take 1 tablet (800 mg total) by mouth every 8 (eight) hours as needed for moderate pain. 40 tablet 0  . propranolol (INDERAL) 10 MG tablet Take 1 tablet (10 mg total) by mouth 3 (three) times daily. 60 tablet 3   No current facility-administered medications on file prior to visit.    No Known Allergies Family History  Problem Relation Age of Onset  . Leukemia Mother   . Cancer Mother 20    leukemia  . Lung cancer Father     SMOKER  . Cancer Father 7    lung ca  . Heart disease Other   . Stroke Other   . Alcohol abuse Other   . Breast cancer Other     PE: BP 128/63   Pulse 95   Wt 122 lb (55.3 kg)   BMI 22.31 kg/m   Wt Readings from Last 3 Encounters:  09/07/16 122 lb (55.3 kg)  08/17/16 122 lb (55.3 kg)  02/28/16 129 lb (58.5 kg)   Constitutional: overweight, in NAD Eyes: PERRLA, EOMI, no exophthalmos, no lid lag, no stare ENT: moist mucous membranes, + mild symmetric thyromegaly, no thyroid bruits, no cervical lymphadenopathy Cardiovascular: tachycardia, RR, No MRG Respiratory: CTA B Gastrointestinal: abdomen soft, NT, ND, BS+ Musculoskeletal: no deformities, strength intact in all 4 Skin: moist, warm, no rashes Neurological: + tremor with outstretched hands, DTR normal in all 4  ASSESSMENT: 1. Thyrotoxicosis - x almost 3 years  PLAN:  1. Patient with a h/o low TSH, with thyrotoxic sxs: weight loss, tremors, hyperdefecation, palpitations, anxiety.  - she does not appear to have exogenous causes for the low TSH.  - We discussed that possible causes of  thyrotoxicosis are:  Graves ds   Thyroiditis toxic multinodular goiter/ toxic adenoma (I cannot feel nodules at palpation of her thyroid). - I suggested that we check the TSH, fT3 and fT4 and also add thyroid stimulating antibodies to screen for Graves' disease.  - If the tests remain abnormal, we may need an uptake and scan to differentiate between the 3 above possible etiologies  - we discussed about possible modalities of treatment for the above conditions, to include methimazole use (we discussed about poss. SEs), radioactive iodine ablation (given more info about this) or (last resort) surgery. - we might need to do thyroid ultrasound depending on the results of the uptake and scan (if a cold nodule is present) - will continue beta blocker at this time, since she is tachycardic, anxious, and  tremulous >> will increase the Propranolol dose to 10 mg 3x a day - RTC in 3 months, but likely sooner for repeat labs  Orders Placed This Encounter  Procedures  . TSH  . T4, free  . T3, free  . Thyroid Stimulating Immunoglobulin   - time spent with the patient: 40 min, of which >50% was spent in obtaining information about her symptoms, reviewing her previous labs, evaluations, and treatments, counseling her about her condition (please see the discussed topics above), and developing a plan to further investigate and treat it.  Component     Latest Ref Rng & Units 09/07/2016  TSH     0.35 - 4.50 uIU/mL 0.01 (L)  T4,Free(Direct)     0.60 - 1.60 ng/dL 4.44 (H)  Triiodothyronine,Free,Serum     2.3 - 4.2 pg/mL 10.4 (H)   Labs are still abnormal. TSI antibodies are pending. My suspicion for Graves is high, therefore, I will advise her to start methimazole 5 mg 3x a day and come back for labs in 4 weeks.   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 We'll continue with the plan to start methimazole as described  above.   Philemon Kingdom, MD PhD [facility]

## 2016-09-07 NOTE — Patient Instructions (Addendum)
Please stop at the lab.  Please increase Inderal to 10 mg 3x a day.  Depending on the labs, we will decide for or against a thyroid uptake and scan.  Please come back for a follow-up appointment in 3 months.   Hipertiroidismo (Hyperthyroidism) El hipertiroidismo ocurre cuando la tiroides est demasiado activa (hiperactiva). La tiroides es una glndula grande ubicada en el cuello que ayuda a Chief Technology Officer la forma en que el organismo [place] los alimentos (metabolismo). Cuando la tiroides est hiperactiva, produce una cantidad excesiva de una hormona llamada tiroxina. CAUSAS Las causas del hipertiroidismo pueden incluir lo siguiente:  Enfermedad de Graves-[place]. Se produce cuando el sistema inmunitario ataca la tiroides. sta es la causa ms frecuente.  Inflamacin de la tiroides.  Tumor en la tiroides o Manufacturing systems engineer.  Uso excesivo de medicamentos para la tiroides, entre ellos:  Suplementos recetados para la tiroides.  Suplementos a base de hierbas que imitan a las hormonas tiroideas.  Bultos slidos o llenos de lquido en la tiroides (ndulos tiroideos).  Ingesta excesiva de yodo. FACTORES DE RIESGO  Ser mujer.  Tener antecedentes familiares de enfermedades tiroideas. Rincon signos y los sntomas de hipertiroidismo pueden ser los siguientes:  Nerviosismo.  Incapacidad para Agricultural engineer.  Prdida de peso sin causa aparente.  Diarrea.  Cambios en la textura del pelo o la piel.  Falta de latidos cardacos o ms latidos cardacos.  Frecuencia cardaca acelerada.  Ausencia de la [employer].  Temblores en las manos.  Fatiga.  Agitacin.  Aumento del apetito.  Problemas para dormir.  Aumento del tamao de la tiroides o ndulos. DIAGNSTICO El diagnstico de hipertiroidismo puede incluir lo siguiente:  Examen fsico e historia clnica.  Anlisis de [place].  Ecografas. TRATAMIENTO El tratamiento puede incluir lo  siguiente:  Medicamentos para Aeronautical engineer tiroides.  Ciruga para extirpar la tiroides.  Radioterapia. [place] los medicamentos solamente como se lo haya indicado el mdico.  No consuma ningn producto que contenga tabaco, lo que incluye cigarrillos, tabaco de Higher education careers adviser o Psychologist, sport and exercise. Si necesita ayuda para dejar de fumar, consulte al mdico.  No reanude la actividad fsica ni los ejercicios hasta que el mdico lo autorice.  Concurra a todas las visitas de control, segn le indique su mdico. Esto es importante. SOLICITE ATENCIN MDICA SI:  Los sntomas no mejoran con Dispensing optician.  Tiene fiebre.  Est tomando medicamentos sustitutivos de la tiroides y:  Est deprimido.  Se siente mental y fsicamente lento.  Aumenta de [place]. SOLICITE ATENCIN MDICA DE INMEDIATO SI:  Disminuy su estado de alerta o hay cambios en la conciencia.  Siente dolor abdominal.  Siente mareos.  Tiene una frecuencia cardaca acelerada.  Tiene latidos cardacos irregulares. Esta informacin no tiene Marine scientist el consejo del mdico. Asegrese de hacerle al mdico cualquier pregunta que tenga. Document Released: 09/21/2005 Document Revised: 10/12/2014 Document Reviewed: 02/06/2014 Elsevier Interactive Patient Education  2017 [place] con yodo radioactivo (I-131) para el hipertiroidismo (Radioiodine [I-131] Therapy for Hyperthyroidism) La terapia con yodo radioactivo (I-131) es un procedimiento para tratar la hiperactividad de la glndula tiroidea (hipertiroidismo). La tiroides es una glndula del cuello que, a travs del yodo, [place] a Aeronautical engineer forma en la que el cuerpo [place] los alimentos (metabolismo). En este procedimiento, se traga un comprimido o un lquido que contiene I-131. El I-131 es yodo fabricado (sinttico) que emite radiacin. Esto destruye las clulas tiroideas y revierte el  hipertiroidismo. INFORME A  SU MDICO:  Cualquier alergia que tenga.  Todos los [employer], incluidos vitaminas, hierbas, gotas oftlmicas, cremas y medicamentos de venta libre.  Problemas previos que usted o los [employer] de su familia hayan tenido con el uso de anestsicos.  Enfermedades de la sangre que tenga.  Si tiene cirugas previas.  Cualquier enfermedad que tenga.  Si est embarazada o podra estarlo, o si ha pasado por la menopausia, en el caso de que corresponda.  Si tiene hijos en la actualidad.  Si planifica tener hijos en los prximos 2aos.  Cualquier contacto que tenga con nios o con mujeres embarazadas.  Sus planes de viajar durante los prximos 98meses.  Si debe pasar por detectores de radiacin en el trabajo o cuando viaja. RIESGOS Y COMPLICACIONES En general, se trata de un procedimiento seguro. Sin embargo, pueden ocurrir complicaciones, por ejemplo:  Dao en otras estructuras u rganos, como las glndulas salivales. Esto podra ocasionar sequedad en la boca y prdida del sentido del gusto.  Recuento espermtico bajo, en el caso de que corresponda. Esto puede ocasionar infertilidad temporal.  Dolor de cuello o garganta. Esto es temporal.  Leve aumento del riesgo de cncer de tiroides.  Nuseas o vmitos. ANTES DEL PROCEDIMIENTO  Consulte a su mdico si debe cambiar o suspender los medicamentos que toma habitualmente. Esto es muy importante si toma medicamentos para la diabetes, anticoagulantes o medicamentos para la tiroides.  Si es [place], es posible que tenga que hacerse una prueba de [place].  Las mujeres que amamantan deben planificar dejar de Nature conservation officer al menos 6semanas antes del procedimiento.  Siga las indicaciones del mdico respecto de las restricciones para las comidas o las bebidas.  Planifique evitar el contacto con otras personas durante 1semana despus del tratamiento. Lo ms importante es Counselling psychologist con los nios y las [place]. Para  esto, planifique faltar al [place] y [place] en su casa, organice el cuidado de los nios y [place] solo o sola, si estas cosas corresponden.  Planifique conducir hasta su casa despus del tratamiento. No viaje en transporte pblico. Si necesita que alguien lo lleve a su casa, sintese lo ms lejos posible del conductor. PROCEDIMIENTO  Le darn una dosis de I-131 para que trague. Ser en forma de comprimido o lquido.  La glndula tiroidea absorber el I-131 durante los 22meses siguientes. [place] del tratamiento estar completo en alrededor de 16meses. DESPUS DEL PROCEDIMIENTO  Es posible que Arboriculturist hospital durante 24horas despus del tratamiento. Esto depende de los requisitos de su estado.  Siga las indicaciones del mdico acerca de lo siguiente:  Cmo cuidarse despus del procedimiento.  Cmo proteger a los dems de la exposicin a la radiacin a medida que el cuerpo la elimine. Esta informacin no tiene Marine scientist el consejo del mdico. Asegrese de hacerle al mdico cualquier pregunta que tenga. Document Released: 07/12/2013 Document Revised: 01/13/2016 Document Reviewed: 01/16/2015 Elsevier Interactive Patient Education  2017 [place] con yodo radioactivo (I-131) para el hipertiroidismo, cuidados posteriores (Radioiodine [I-131] Therapy for Hyperthyroidism, Care After) Siga estas instrucciones durante las prximas semanas. Estas indicaciones le proporcionan informacin acerca de cmo deber cuidarse despus del procedimiento. El mdico tambin podr darle instrucciones ms especficas. El tratamiento ha sido planificado segn las prcticas mdicas actuales, pero en algunos casos pueden ocurrir problemas. Comunquese con el mdico si tiene algn problema o dudas despus del procedimiento. QU ESPERAR DESPUS DEL PROCEDIMIENTO Despus del procedimiento, es comn [employer]  de garganta o un dolor leve en el cuello durante varios  meses. INSTRUCCIONES PARA EL CUIDADO EN EL HOGAR Durante las primeras 48horas despus del procedimiento:   No use baos pblicos.  Descargue el [employer] despus de usarlo.  Tome un bao o una ducha [employer].  No prepare comida para Producer, television/film/video.  Lave, por separado, las sbanas, las toallas y la ropa [employer]. Embarazo y amamantamiento   No intente quedar embarazada durante el tiempo que le haya indicado el mdico. Esto puede ser durante un perodo de hasta 1ao despus del procedimiento. Utilice un mtodo anticonceptivo para Therapist, occupational. Hable con su mdico sobre el mtodo anticonceptivo adecuado para usted.  No amamante durante el tiempo que le haya indicado el mdico, si esto corresponde. Instrucciones generales   Evite el contacto cercano con [employer]. Lo ms importante es evitar el contacto con las embarazadas y los nios. Haga esto durante 1semana despus del procedimiento.  Trate de mantener una distancia de alrededor de 3pies (1 metro) de [employer].  [place] solo. No tenga contacto ntimo.  Si tiene hijos, organcese para que los cuide Nurse, children's.  No comparta vehculos con [employer].  No se hospede en un hotel.  Tome los medicamentos de venta libre y los recetados solamente como se lo haya indicado el mdico. Esto incluye cualquier medicamento para el tratamiento de la tiroides.  Si viaja, lleve consigo una nota del mdico para explicar que ha recibido terapia con yodo radioactivo. Esto se debe a que la radioactividad Science writer en los aeropuertos u otros lugares.  No comparta utensilios, como cubiertos, platos o tazas. Use utensilios descartables o limpie los utensilios por separado de los que [employer].  Lvese las manos con frecuencia. Use desinfectante para manos si no dispone de [place].  Beba suficiente lquido para Consulting civil engineer orina clara o de color amarillo plido.  Concurra a  todas las visitas de control como se lo haya indicado el mdico. Esto es importante. Pueden indicarle visitas de control cada 1o 62meses despus del tratamiento. SOLICITE ATENCIN MDICA SI:  Tiene un dolor que empeora o que no mejora con los medicamentos.  Tiene sequedad en la boca.  Pierde el sentido del gusto.  Queda embarazada antes de que pase 1ao del procedimiento, si esto corresponde.  Se siente cansado (fatigado) de un modo que no es habitual.  Tiene la piel [place].  Comienza a perder el cabello.  Defeca con menos frecuencia o ms dificultad que de costumbre (estreimiento).  Aumenta de peso sin causa aparente.  Siente fro constantemente. Esta informacin no tiene Marine scientist el consejo del mdico. Asegrese de hacerle al mdico cualquier pregunta que tenga. Document Released: 09/02/2015 Document Revised: 09/02/2015 Document Reviewed: 01/16/2015 Elsevier Interactive Patient Education  2017 [employer].

## 2016-09-08 ENCOUNTER — Encounter: Payer: Self-pay | Admitting: Internal Medicine

## 2016-09-09 MED ORDER — METHIMAZOLE 5 MG PO TABS: 5.0000 mg | ORAL_TABLET | Freq: Three times a day (TID) | ORAL | 2 refills | Status: DC

## 2016-09-12 LAB — THYROID STIMULATING IMMUNOGLOBULIN: TSI: 630 %{baseline} — AB (ref <140)

## 2016-09-21 ENCOUNTER — Ambulatory Visit: Payer: BC Managed Care – PPO | Admitting: Internal Medicine

## 2016-10-12 ENCOUNTER — Ambulatory Visit: Payer: BC Managed Care – PPO | Admitting: Internal Medicine

## 2016-11-02 ENCOUNTER — Encounter: Payer: BC Managed Care – PPO | Admitting: Internal Medicine

## 2016-11-11 ENCOUNTER — Other Ambulatory Visit: Payer: Self-pay | Admitting: *Deleted

## 2016-11-11 MED ORDER — PROPRANOLOL HCL 10 MG PO TABS: 10.0000 mg | ORAL_TABLET | Freq: Three times a day (TID) | ORAL | 0 refills | Status: DC

## 2016-11-24 ENCOUNTER — Encounter: Payer: BC Managed Care – PPO | Admitting: Internal Medicine

## 2016-11-30 MED ORDER — PROPRANOLOL HCL 10 MG PO TABS: 10.0000 mg | ORAL_TABLET | Freq: Three times a day (TID) | ORAL | 0 refills | Status: DC

## 2016-11-30 NOTE — Addendum Note (Signed)
Addended by: Earnstine Regal on: 11/30/2016 11:27 AM   Modules accepted: Orders

## 2016-12-08 ENCOUNTER — Encounter: Payer: Self-pay | Admitting: Internal Medicine

## 2016-12-08 ENCOUNTER — Ambulatory Visit (INDEPENDENT_AMBULATORY_CARE_PROVIDER_SITE_OTHER): Payer: BC Managed Care – PPO | Admitting: Internal Medicine

## 2016-12-08 VITALS — BP 110/80 | HR 66 | Wt 142.0 lb

## 2016-12-08 DIAGNOSIS — E0500 Thyrotoxicosis with diffuse goiter without thyrotoxic crisis or storm: Secondary | ICD-10-CM | POA: Diagnosis not present

## 2016-12-08 MED ORDER — PROPRANOLOL HCL 10 MG PO TABS: 10.0000 mg | ORAL_TABLET | Freq: Two times a day (BID) | ORAL | 0 refills | Status: DC

## 2016-12-08 NOTE — Progress Notes (Signed)
Patient ID: Linda Stewart, female   DOB: 1970/10/15, 46 y.o.   MRN: [id number]    HPI  Linda Stewart is a 46 y.o.-year-old female, returning for f/u for Graves ds - dx at last OV. Last visit 3 mo ago.   Reviewed and addended hx: I saw the pt first in 2015, when she presented with thyrotoxicosis, but this was improving and I advised her to return for labs in 2 months, but did not intervene at that time. Pt. Was lost for f/u.   She then presented to PCP in 2017 >> still thyrotoxic >> advised to return to see me.  She was started on Propranolol 10 mg 3x a day by PCP.  I reviewed pt's thyroid tests: Lab Results  Component Value Date   TSH 0.01 (L) 09/07/2016   TSH 0.00 (L) 08/17/2016   TSH 0.02 (L) 10/30/2015   TSH 0.19 (L) 11/20/2014   TSH 0.00 Repeated and verified X2. (L) 01/15/2014   TSH 0.02 (L) 11/23/2013   TSH 0.01 (L) 10/17/2013   TSH 3.71 09/26/2012   TSH 2.83 11/24/2011   TSH 2.57 07/10/2011   FREET4 4.44 (H) 09/07/2016   FREET4 4.58 (H) 08/17/2016   FREET4 1.19 11/20/2014   FREET4 1.22 01/15/2014   FREET4 1.67 (H) 11/23/2013    Component     Latest Ref Rng & Units 09/07/2016  TSI     <140 % baseline 630 (H)   We started MMI 5 mg 3x a day at last visit >> she did not return for labs in 1.5 mo as advised (!). She is feeling much better, with most of her symptoms resolved or improved.  Pt denies feeling nodules in neck, hoarseness, dysphagia/odynophagia, SOB with lying down; she c/o: - + initially weight loss - 15 lbs in 9 mo, then weight gain: 20 lbs. - resolved hyperdefecation - resolved tremors - resolved fatigue and insomnia - no excessive sweating/heat intolerance - resolved anxiety - resolved palpitations - + hair loss   Pt does not have a FH of thyroid ds. No FH of thyroid cancer. No h/o radiation tx to head or neck.  No seaweed or kelp, no recent contrast studies. No steroid use. No herbal supplements. No Biotin use.  She also has a history of CVA -  when in [place] (~46 y/o) - she was told at that time that her thyroid was abnormal. She also has an aneurysm - previously coiled. She has ATIII deficiency, B12 def.   ROS: Constitutional: + see HPI Eyes: no blurry vision, no xerophthalmia ENT: no sore throat, + see HPI Cardiovascular: no CP/SOB/palpitations/no leg swelling Respiratory: no cough/SOB Gastrointestinal: no N/V/D/C Musculoskeletal: no muscle/joint aches Skin: no rashes Neurological: no tremors/no numbness/tingling/dizziness, no HA   I reviewed pt's medications, allergies, PMH, social hx, family hx, and changes were documented in the history of present illness. Otherwise, unchanged from my initial visit note.  Past Medical History:  Diagnosis Date  . 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 Endometriosis determined by laparoscopy 02/28/2016  . 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 History of cerebral aneurysm repair dx 10-18-2006 via MRI/MRA  5.12mm x 4.67mm saccular   11-16-2006  stent-assisted coiling LICA intracranial superior hypophyseal region  . History of CVA (cerebrovascular accident)    2003  . History of ovarian cyst   . Pelvic pain in female 12/05/2015  . Uterine polyp 12/05/2015   Past Surgical History:  Procedure Laterality Date  .  HYSTEROSCOPY W/D&C N/A 02/28/2016   Procedure: DILATATION AND CURETTAGE /DIAGNOSTIC HYSTEROSCOPY;  Surgeon: Janyth Contes, MD;  Location: [facility];  Service: Gynecology;  Laterality: N/A;  . LAPAROSCOPIC OVARIAN CYSTECTOMY  2001  . LAPAROSCOPY N/A 02/28/2016   Procedure: OPERATIVE LAPAROSCOPY; LYSIS OF ADHESIONS;  Surgeon: Janyth Contes, MD;  Location: [facility];  Service: Gynecology;  Laterality: N/A;  . 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  11-16-2006   dr deveshwar   coiling  (general anesthesia)   Social History   Social History  .  Marital status: Married    Spouse name: N/A  . Number of children: 2: 86 and 31 y/o in 2017   Occupational History  . Office Support - Environmental consultant    Social History Main Topics  . Smoking status: Never Smoker  . Smokeless tobacco: Never Used  . Alcohol use No  . Drug use: Tequila, beer - occasionally   Social History Narrative   Immigrated to [place] 1999 from [place]   Current Outpatient Prescriptions on File Prior to Visit  Medication Sig Dispense Refill  . aspirin 325 MG EC tablet Take 325 mg by mouth daily.      . Cholecalciferol (VITAMIN D3) 2000 units TABS Take 1 capsule by mouth daily.    . Cyanocobalamin (VITAMIN B-12) 500 MCG SUBL Place 1 tablet (500 mcg total) under the tongue 1 day or 1 dose. 100 tablet 3  . ibuprofen (ADVIL,MOTRIN) 800 MG tablet Take 1 tablet (800 mg total) by mouth every 8 (eight) hours as needed for moderate pain. 40 tablet 0  . methimazole (TAPAZOLE) 5 MG tablet Take 1 tablet (5 mg total) by mouth 3 (three) times daily. 135 tablet 2  . propranolol (INDERAL) 10 MG tablet Take 1 tablet (10 mg total) by mouth 3 (three) times daily. Overdue for yearly physical must see MD for refills 60 tablet 0   No current facility-administered medications on file prior to visit.    No Known Allergies Family History  Problem Relation Age of Onset  . Leukemia Mother   . Cancer Mother 52    leukemia  . Lung cancer Father     SMOKER  . Cancer Father 84    lung ca  . Heart disease Other   . Stroke Other   . Alcohol abuse Other   . Breast cancer Other    PE: BP 110/80 (BP Location: Left Arm, Patient Position: Sitting)   Pulse 66   Wt 142 lb (64.4 kg)   LMP 11/28/2016   SpO2 98%   BMI 25.97 kg/m   Wt Readings from Last 3 Encounters:  12/08/16 142 lb (64.4 kg)  09/07/16 122 lb (55.3 kg)  08/17/16 122 lb (55.3 kg)   Constitutional: overweight, in NAD Eyes: PERRLA, EOMI, no exophthalmos, no lid lag, no stare ENT: moist mucous membranes, + mild symmetric  thyromegaly, no cervical lymphadenopathy Cardiovascular: RRR, No MRG Respiratory: CTA B Gastrointestinal: abdomen soft, NT, ND, BS+ Musculoskeletal: no deformities, strength intact in all 4 Skin: moist, warm, no rashes Neurological: no tremor with outstretched hands, DTR normal in all 4  ASSESSMENT: 1. Graves ds.  PLAN:  1. Patient with a h/o low TSH, with thyrotoxic sxs: weight loss, tremors, hyperdefecation, palpitations, anxiety, diagnosed as graves disease at last visit, based on the elevated TSI antibodies. Her symptoms are now all resolved on MMI and Propranolol. In fact, she gained 20 pounds since last visit and  her heartrate is not low, therefore, will decrease the propranolol to only twice a day for 2 weeks, then once a day for another 2 weeks and then I advised her to stop - We will recheck the TSH, fT3 and fT4 now to see if we can also decrease the methimazole - we discussed about possible modalities of treatment for the above conditions, to include methimazole use (she is aware of poss. SEs), radioactive iodine ablation or (last resort) surgery. I explained that if she is responding well to the methimazole, there is no need for the other treatments. Based on symptoms, she had a very good response to the medication. - RTC in 4 months, but likely sooner for repeat labs  Component     Latest Ref Rng & Units 12/08/2016  TSH     0.450 - 4.500 uIU/mL 4.110  T4,Free(Direct)     0.82 - 1.77 ng/dL 0.75 (L)  Triiodothyronine,Free,Serum     2.0 - 4.4 pg/mL 2.1   Labs are much improved. We can decrease the methimazole now to 5 mg twice a day and repeat her labs in 5 weeks.  Philemon Kingdom, MD PhD [facility] Endocrinology

## 2016-12-08 NOTE — Patient Instructions (Signed)
Please continue Methimazole 5 mg 3x a day.  Please decrease Propranolol to 10 mg 2x a day for 2 weeks, then 1x day for 2 weeks, then you can stop.  Please stop at the lab.  Please come back for a follow-up appointment in 4 months.

## 2016-12-09 LAB — T3, FREE: T3 FREE: 2.1 pg/mL (ref 2.0–4.4)

## 2016-12-09 LAB — TSH: TSH: 4.11 u[IU]/mL (ref 0.450–4.500)

## 2016-12-09 LAB — T4, FREE: FREE T4: 0.75 ng/dL — AB (ref 0.82–1.77)

## 2016-12-09 MED ORDER — METHIMAZOLE 5 MG PO TABS: 5.0000 mg | ORAL_TABLET | Freq: Two times a day (BID) | ORAL | 1 refills | Status: DC

## 2016-12-10 ENCOUNTER — Other Ambulatory Visit (INDEPENDENT_AMBULATORY_CARE_PROVIDER_SITE_OTHER): Payer: BC Managed Care – PPO

## 2016-12-10 ENCOUNTER — Ambulatory Visit (INDEPENDENT_AMBULATORY_CARE_PROVIDER_SITE_OTHER): Payer: BC Managed Care – PPO | Admitting: Internal Medicine

## 2016-12-10 ENCOUNTER — Encounter: Payer: Self-pay | Admitting: Internal Medicine

## 2016-12-10 VITALS — BP 122/72 | HR 64 | Temp 98.4°F | Resp 16 | Ht 62.0 in | Wt 141.8 lb

## 2016-12-10 DIAGNOSIS — E0500 Thyrotoxicosis with diffuse goiter without thyrotoxic crisis or storm: Secondary | ICD-10-CM

## 2016-12-10 DIAGNOSIS — E538 Deficiency of other specified B group vitamins: Secondary | ICD-10-CM

## 2016-12-10 DIAGNOSIS — Z0000 Encounter for general adult medical examination without abnormal findings: Secondary | ICD-10-CM

## 2016-12-10 DIAGNOSIS — R252 Cramp and spasm: Secondary | ICD-10-CM | POA: Diagnosis not present

## 2016-12-10 DIAGNOSIS — Z23 Encounter for immunization: Secondary | ICD-10-CM | POA: Diagnosis not present

## 2016-12-10 DIAGNOSIS — R634 Abnormal weight loss: Secondary | ICD-10-CM

## 2016-12-10 LAB — BASIC METABOLIC PANEL
BUN: 18 mg/dL (ref 6–23)
CALCIUM: 9.7 mg/dL (ref 8.4–10.5)
CO2: 29 mEq/L (ref 19–32)
Chloride: 103 mEq/L (ref 96–112)
Creatinine, Ser: 0.79 mg/dL (ref 0.40–1.20)
GFR: 83.9 mL/min (ref >60.00)
GLUCOSE: 85 mg/dL (ref 70–99)
POTASSIUM: 3.9 meq/L (ref 3.5–5.1)
SODIUM: 138 meq/L (ref 135–145)

## 2016-12-10 LAB — LIPID PANEL
CHOLESTEROL: 218 mg/dL — AB (ref 0–200)
HDL: 71.9 mg/dL (ref >39.00)
LDL CALC: 116 mg/dL — AB (ref 0–99)
NonHDL: 145.76
TRIGLYCERIDES: 148 mg/dL (ref 0.0–149.0)
Total CHOL/HDL Ratio: 3
VLDL: 29.6 mg/dL (ref 0.0–40.0)

## 2016-12-10 LAB — URINALYSIS
BILIRUBIN URINE: NEGATIVE
HGB URINE DIPSTICK: NEGATIVE
Ketones, ur: NEGATIVE
Leukocytes, UA: NEGATIVE
NITRITE: NEGATIVE
Specific Gravity, Urine: 1.025 (ref 1.000–1.030)
TOTAL PROTEIN, URINE-UPE24: NEGATIVE
UROBILINOGEN UA: 0.2 (ref 0.0–1.0)
Urine Glucose: NEGATIVE
pH: 6 (ref 5.0–8.0)

## 2016-12-10 LAB — CBC WITH DIFFERENTIAL/PLATELET
BASOS PCT: 1.1 % (ref 0.0–3.0)
Basophils Absolute: 0 10*3/uL (ref 0.0–0.1)
EOS ABS: 0.1 10*3/uL (ref 0.0–0.7)
EOS PCT: 3.4 % (ref 0.0–5.0)
HCT: 39.1 % (ref 36.0–46.0)
Hemoglobin: 12.9 g/dL (ref 12.0–15.0)
LYMPHS ABS: 1.9 10*3/uL (ref 0.7–4.0)
Lymphocytes Relative: 47.1 % — ABNORMAL HIGH (ref 12.0–46.0)
MCHC: 33 g/dL (ref 30.0–36.0)
MCV: 80.3 fl (ref 78.0–100.0)
MONO ABS: 0.3 10*3/uL (ref 0.1–1.0)
Monocytes Relative: 8.3 % (ref 3.0–12.0)
NEUTROS ABS: 1.6 10*3/uL (ref 1.4–7.7)
NEUTROS PCT: 40.1 % — AB (ref 43.0–77.0)
PLATELETS: 273 10*3/uL (ref 150.0–400.0)
RBC: 4.87 Mil/uL (ref 3.87–5.11)
RDW: 16.8 % — AB (ref 11.5–15.5)
WBC: 4 10*3/uL (ref 4.0–10.5)

## 2016-12-10 LAB — HEPATIC FUNCTION PANEL
ALBUMIN: 4.4 g/dL (ref 3.5–5.2)
ALT: 24 U/L (ref 0–35)
AST: 39 U/L — AB (ref 0–37)
Alkaline Phosphatase: 115 U/L (ref 39–117)
Bilirubin, Direct: 0.1 mg/dL (ref 0.0–0.3)
Total Bilirubin: 0.3 mg/dL (ref 0.2–1.2)
Total Protein: 7.7 g/dL (ref 6.0–8.3)

## 2016-12-10 LAB — VITAMIN B12: Vitamin B-12: 542 pg/mL (ref 211–911)

## 2016-12-10 LAB — CK: CK TOTAL: 2026 U/L — AB (ref 7–177)

## 2016-12-10 LAB — MAGNESIUM: MAGNESIUM: 2.2 mg/dL (ref 1.5–2.5)

## 2016-12-10 NOTE — Assessment & Plan Note (Signed)
On Methimizole

## 2016-12-10 NOTE — Assessment & Plan Note (Signed)
We discussed age appropriate health related issues, including available/recomended screening tests and vaccinations. We discussed a need for adhering to healthy diet and exercise. Labs/EKG were reviewed/ordered. All questions were answered.   

## 2016-12-10 NOTE — Progress Notes (Signed)
Pre-visit discussion using our clinic review tool. No additional management support is needed unless otherwise documented below in the visit note.  

## 2016-12-10 NOTE — Assessment & Plan Note (Addendum)
?  Methimizole related - dose being reduced Try Magnesium, Tylenol PM at night and CoQ10 for cramps Labs - CK

## 2016-12-10 NOTE — Assessment & Plan Note (Signed)
On B12 

## 2016-12-10 NOTE — Patient Instructions (Addendum)
Try Magnesium, Tylenol PM at night and CoQ10 for cramps

## 2016-12-10 NOTE — Assessment & Plan Note (Signed)
Resolved Wt Readings from Last 3 Encounters:  12/10/16 141 lb 12 oz (64.3 kg)  12/08/16 142 lb (64.4 kg)  09/07/16 122 lb (55.3 kg)

## 2016-12-10 NOTE — Progress Notes (Signed)
Subjective:  Patient ID: Linda Stewart, female    DOB: Feb 12, 1971  Age: 46 y.o. MRN: [id number]  CC: Annual Exam (muscle cramps throught entire body)   HPI Linda Stewart presents for a well exam F/u Wt loss, B12 def, hyperthyroidism f/u C/o muscle cramps x 1 mo  Outpatient Medications Prior to Visit  Medication Sig Dispense Refill  . aspirin 325 MG EC tablet Take 325 mg by mouth daily.      . Cholecalciferol (VITAMIN D3) 2000 units TABS Take 1 capsule by mouth daily.    . Cyanocobalamin (VITAMIN B-12) 500 MCG SUBL Place 1 tablet (500 mcg total) under the tongue 1 day or 1 dose. 100 tablet 3  . ibuprofen (ADVIL,MOTRIN) 800 MG tablet Take 1 tablet (800 mg total) by mouth every 8 (eight) hours as needed for moderate pain. 40 tablet 0  . methimazole (TAPAZOLE) 5 MG tablet Take 1 tablet (5 mg total) by mouth 2 (two) times daily. 60 tablet 1  . propranolol (INDERAL) 10 MG tablet Take 1 tablet (10 mg total) by mouth 2 (two) times daily. Overdue for yearly physical must see MD for refills 60 tablet 0   No facility-administered medications prior to visit.     ROS Review of Systems  Constitutional: Negative for activity change, appetite change, chills, fatigue and unexpected weight change.  HENT: Negative for congestion, mouth sores and sinus pressure.   Eyes: Negative for visual disturbance.  Respiratory: Negative for cough and chest tightness.   Gastrointestinal: Negative for abdominal pain and nausea.  Genitourinary: Negative for difficulty urinating, frequency and vaginal pain.  Musculoskeletal: Positive for myalgias. Negative for back pain and gait problem.  Skin: Negative for pallor and rash.  Neurological: Negative for dizziness, tremors, weakness, numbness and headaches.  Psychiatric/Behavioral: Negative for confusion and sleep disturbance.  cramps  Objective:  BP 122/72   Pulse 64   Temp 98.4 F (36.9 C) (Oral)   Resp 16   Ht 5\' 2"  (1.575 m)   Wt 141 lb 12 oz (64.3  kg)   LMP 11/30/2016   SpO2 98%   BMI 25.93 kg/m   BP Readings from Last 3 Encounters:  12/10/16 122/72  12/08/16 110/80  09/07/16 128/63    Wt Readings from Last 3 Encounters:  12/10/16 141 lb 12 oz (64.3 kg)  12/08/16 142 lb (64.4 kg)  09/07/16 122 lb (55.3 kg)    Physical Exam  Constitutional: She appears well-developed. No distress.  HENT:  Head: Normocephalic.  Right Ear: External ear normal.  Left Ear: External ear normal.  Nose: Nose normal.  Mouth/Throat: Oropharynx is clear and moist.  Eyes: Conjunctivae are normal. Pupils are equal, round, and reactive to light. Right eye exhibits no discharge. Left eye exhibits no discharge.  Neck: Normal range of motion. Neck supple. No JVD present. No tracheal deviation present. No thyromegaly present.  Cardiovascular: Normal rate, regular rhythm and normal heart sounds.   Pulmonary/Chest: No stridor. No respiratory distress. She has no wheezes.  Abdominal: Soft. Bowel sounds are normal. She exhibits no distension and no mass. There is no tenderness. There is no rebound and no guarding.  Musculoskeletal: She exhibits no edema or tenderness.  Lymphadenopathy:    She has no cervical adenopathy.  Neurological: She displays normal reflexes. No cranial nerve deficit. She exhibits normal muscle tone. Coordination normal.  Skin: No rash noted. No erythema.  Psychiatric: She has a normal mood and affect. Her behavior is normal. Judgment and thought content normal.  muscles NT  Lab Results  Component Value Date   WBC 3.9 (L) 08/17/2016   HGB 12.1 08/17/2016   HCT 36.5 08/17/2016   PLT 235.0 08/17/2016   GLUCOSE 111 (H) 08/17/2016   CHOL 144 10/30/2015   TRIG 87.0 10/30/2015   HDL 57.40 10/30/2015   LDLCALC 70 10/30/2015   ALT 27 08/17/2016   AST 21 08/17/2016   NA 139 08/17/2016   K 3.9 08/17/2016   CL 105 08/17/2016   CREATININE 0.65 08/17/2016   BUN 20 08/17/2016   CO2 26 08/17/2016   TSH 4.110 12/08/2016   INR 0.99  09/22/2012    No results found.  Assessment & Plan:   Linda Stewart was seen today for annual exam.  Diagnoses and all orders for this visit:  Encounter for immunization -     Flu Vaccine QUAD 36+ mos IM   I am having Linda Stewart maintain her aspirin, Vitamin B-12, Vitamin D3, ibuprofen, propranolol, and methimazole.  No orders of the defined types were placed in this encounter.    Follow-up: No Follow-up on file.  Walker Kehr, MD

## 2017-01-18 ENCOUNTER — Telehealth: Payer: Self-pay | Admitting: Internal Medicine

## 2017-01-18 NOTE — Telephone Encounter (Signed)
Patient want to speak to some one about a claim

## 2017-01-28 ENCOUNTER — Other Ambulatory Visit (INDEPENDENT_AMBULATORY_CARE_PROVIDER_SITE_OTHER): Payer: BC Managed Care – PPO

## 2017-01-28 DIAGNOSIS — E0500 Thyrotoxicosis with diffuse goiter without thyrotoxic crisis or storm: Secondary | ICD-10-CM | POA: Diagnosis not present

## 2017-01-28 LAB — T3, FREE: T3 FREE: 3.3 pg/mL (ref 2.3–4.2)

## 2017-01-28 LAB — TSH: TSH: 0.37 u[IU]/mL (ref 0.35–4.50)

## 2017-01-28 LAB — T4, FREE: Free T4: 0.92 ng/dL (ref 0.60–1.60)

## 2017-02-26 ENCOUNTER — Telehealth: Payer: Self-pay | Admitting: Internal Medicine

## 2017-02-26 ENCOUNTER — Ambulatory Visit (INDEPENDENT_AMBULATORY_CARE_PROVIDER_SITE_OTHER): Payer: BC Managed Care – PPO | Admitting: Internal Medicine

## 2017-02-26 ENCOUNTER — Ambulatory Visit (INDEPENDENT_AMBULATORY_CARE_PROVIDER_SITE_OTHER)
Admission: RE | Admit: 2017-02-26 | Discharge: 2017-02-26 | Disposition: A | Discharge status: Home / Self Care | Payer: BC Managed Care – PPO | Source: Ambulatory Visit | Attending: Internal Medicine | Admitting: Internal Medicine

## 2017-02-26 ENCOUNTER — Other Ambulatory Visit: Payer: Self-pay | Admitting: Internal Medicine

## 2017-02-26 ENCOUNTER — Encounter: Payer: Self-pay | Admitting: Internal Medicine

## 2017-02-26 VITALS — BP 118/70 | HR 72 | Temp 98.3°F | Resp 12 | Ht 62.0 in | Wt 146.0 lb

## 2017-02-26 DIAGNOSIS — M25532 Pain in left wrist: Secondary | ICD-10-CM | POA: Insufficient documentation

## 2017-02-26 DIAGNOSIS — S52572A Other intraarticular fracture of lower end of left radius, initial encounter for closed fracture: Secondary | ICD-10-CM

## 2017-02-26 MED ORDER — TRAMADOL HCL 50 MG PO TABS: 50.0000 mg | ORAL_TABLET | Freq: Three times a day (TID) | ORAL | 0 refills | Status: DC | PRN

## 2017-02-26 NOTE — Patient Instructions (Signed)
We will get the x-ray of the wrist and call you back with the results and plan.   If there is a fracture we may need to get a cast on the wrist.

## 2017-02-26 NOTE — Telephone Encounter (Signed)
Patient called in.  Gave her x ray response from [facility].  Patient can be reached today at work [phone] or on cell at [phone] once contact has been made with the orthopedist for casting.

## 2017-02-26 NOTE — Assessment & Plan Note (Signed)
Suspicious for fracture. Rx for tramadol for pain. Checking x-ray left wrist and treat as appropriate.

## 2017-02-26 NOTE — Progress Notes (Signed)
   Subjective:    Patient ID: Linda Stewart, female    DOB: January 06, 1971, 46 y.o.   MRN: [id number]  HPI The patient is a 46 YO female coming in for left wrist pain after a fall while ice skating last night. She was waving goodbye and lost balance and fell backwards. Used left wrist to try to catch herself and landed on it. Immediate pain afterwards. Mild swelling this morning. She did ice and take ibuprofen after the incident. Wrapped with some ace bandage. She is not able to twist at the wrist or flex/extend. She is able to move the fingers with mild pain. Pain more located around the wrist and thumb area.   Review of Systems  Constitutional: Positive for activity change. Negative for appetite change, fatigue, fever and unexpected weight change.  Respiratory: Negative.   Cardiovascular: Negative.   Gastrointestinal: Negative.   Musculoskeletal: Positive for arthralgias, joint swelling and myalgias. Negative for back pain, gait problem, neck pain and neck stiffness.  Neurological: Negative.       Objective:   Physical Exam  Constitutional: She is oriented to person, place, and time. She appears well-developed and well-nourished.  HENT:  Head: Normocephalic and atraumatic.  Eyes: EOM are normal.  Neck: Normal range of motion.  Cardiovascular: Normal rate and regular rhythm.   Pulmonary/Chest: Effort normal. No respiratory distress. She has no wheezes. She has no rales.  Abdominal: Soft.  Musculoskeletal: She exhibits edema and tenderness.  Left wrist with pain at the wrist and along the radial aspect, not willing to flex/extend the wrist. Able to move fingers with some pain, thumb cannot move without pain.   Neurological: She is alert and oriented to person, place, and time.  Skin: Skin is warm and dry.   Vitals:   02/26/17 0935  BP: 118/70  Pulse: 72  Resp: 12  Temp: 98.3 F (36.8 C)  TempSrc: Oral  SpO2: 98%  Weight: 146 lb (66.2 kg)  Height: 5\' 2"  (1.575 m)        Assessment & Plan:

## 2017-02-26 NOTE — Telephone Encounter (Signed)
Pt scheduled at [facility] Dr Layne Benton    02/26/17  @ 2pm Pt aware of appt date time and location

## 2017-03-25 DIAGNOSIS — E0590 Thyrotoxicosis, unspecified without thyrotoxic crisis or storm: Secondary | ICD-10-CM | POA: Insufficient documentation

## 2017-06-01 ENCOUNTER — Other Ambulatory Visit: Payer: BC Managed Care – PPO

## 2017-06-02 ENCOUNTER — Other Ambulatory Visit: Payer: BC Managed Care – PPO

## 2017-06-03 ENCOUNTER — Other Ambulatory Visit (INDEPENDENT_AMBULATORY_CARE_PROVIDER_SITE_OTHER): Payer: BC Managed Care – PPO

## 2017-06-03 DIAGNOSIS — E0500 Thyrotoxicosis with diffuse goiter without thyrotoxic crisis or storm: Secondary | ICD-10-CM

## 2017-06-03 LAB — TSH: TSH: <0.01 u[IU]/mL — ABNORMAL LOW (ref 0.35–4.50)

## 2017-06-03 LAB — T3, FREE: T3, Free: 4.8 pg/mL — ABNORMAL HIGH (ref 2.3–4.2)

## 2017-06-03 LAB — T4, FREE: FREE T4: 1.7 ng/dL — AB (ref 0.60–1.60)

## 2017-06-11 ENCOUNTER — Ambulatory Visit: Payer: BC Managed Care – PPO | Admitting: Internal Medicine

## 2017-06-22 ENCOUNTER — Ambulatory Visit (INDEPENDENT_AMBULATORY_CARE_PROVIDER_SITE_OTHER): Payer: BC Managed Care – PPO | Admitting: Internal Medicine

## 2017-06-22 ENCOUNTER — Encounter: Payer: Self-pay | Admitting: Internal Medicine

## 2017-06-22 VITALS — BP 118/74 | HR 71 | Temp 98.3°F | Ht 62.0 in | Wt 148.0 lb

## 2017-06-22 DIAGNOSIS — G8929 Other chronic pain: Secondary | ICD-10-CM | POA: Diagnosis not present

## 2017-06-22 DIAGNOSIS — E538 Deficiency of other specified B group vitamins: Secondary | ICD-10-CM

## 2017-06-22 DIAGNOSIS — M545 Low back pain: Secondary | ICD-10-CM

## 2017-06-22 DIAGNOSIS — Z23 Encounter for immunization: Secondary | ICD-10-CM

## 2017-06-22 DIAGNOSIS — E0500 Thyrotoxicosis with diffuse goiter without thyrotoxic crisis or storm: Secondary | ICD-10-CM

## 2017-06-22 MED ORDER — TRAMADOL HCL 50 MG PO TABS: 50.0000 mg | ORAL_TABLET | Freq: Three times a day (TID) | ORAL | 1 refills | Status: DC | PRN

## 2017-06-22 NOTE — Assessment & Plan Note (Signed)
On B12 

## 2017-06-22 NOTE — Assessment & Plan Note (Signed)
Pt had cramps on higher dose of Methimazole before.  Abn labs - she will try a higher dose of Methimazole again and contact Dr Cruzita Lederer if started to cramp

## 2017-06-22 NOTE — Assessment & Plan Note (Signed)
Tramadol low dose prn, Tylenol

## 2017-06-22 NOTE — Addendum Note (Signed)
Addended by: Karren Cobble on: 06/22/2017 04:52 PM   Modules accepted: Orders

## 2017-06-22 NOTE — Progress Notes (Signed)
Subjective:  Patient ID: Linda Stewart, female    DOB: 1971-06-12  Age: 46 y.o. MRN: [id number]  CC: No chief complaint on file.   HPI Linda Stewart presents for hyperthyroidism/Grave's disease. Methimazole caused cramps at higher dose F/u LBP  Outpatient Medications Prior to Visit  Medication Sig Dispense Refill  . aspirin 325 MG EC tablet Take 325 mg by mouth daily.      . Cholecalciferol (VITAMIN D3) 2000 units TABS Take 1 capsule by mouth daily.    . Cyanocobalamin (VITAMIN B-12) 500 MCG SUBL Place 1 tablet (500 mcg total) under the tongue 1 day or 1 dose. 100 tablet 3  . ibuprofen (ADVIL,MOTRIN) 800 MG tablet Take 1 tablet (800 mg total) by mouth every 8 (eight) hours as needed for moderate pain. 40 tablet 0  . methimazole (TAPAZOLE) 5 MG tablet Take 1 tablet (5 mg total) by mouth 2 (two) times daily. 60 tablet 1  . propranolol (INDERAL) 10 MG tablet Take 1 tablet (10 mg total) by mouth 2 (two) times daily. Overdue for yearly physical must see MD for refills 60 tablet 0  . traMADol (ULTRAM) 50 MG tablet Take 1 tablet (50 mg total) by mouth every 8 (eight) hours as needed. 30 tablet 0   No facility-administered medications prior to visit.     ROS Review of Systems  Constitutional: Negative for activity change, appetite change, chills, fatigue and unexpected weight change.  HENT: Negative for congestion, mouth sores and sinus pressure.   Eyes: Negative for visual disturbance.  Respiratory: Negative for cough and chest tightness.   Gastrointestinal: Negative for abdominal pain and nausea.  Genitourinary: Negative for difficulty urinating, frequency and vaginal pain.  Musculoskeletal: Negative for back pain and gait problem.  Skin: Negative for pallor and rash.  Neurological: Negative for dizziness, tremors, weakness, numbness and headaches.  Psychiatric/Behavioral: Negative for confusion and sleep disturbance.    Objective:  BP 118/74 (BP Location: Left Arm, Patient  Position: Sitting, Cuff Size: Normal)   Pulse 71   Temp 98.3 F (36.8 C) (Oral)   Ht 5\' 2"  (1.575 m)   Wt 148 lb (67.1 kg)   SpO2 99%   BMI 27.07 kg/m   BP Readings from Last 3 Encounters:  06/22/17 118/74  02/26/17 118/70  12/10/16 122/72    Wt Readings from Last 3 Encounters:  06/22/17 148 lb (67.1 kg)  02/26/17 146 lb (66.2 kg)  12/10/16 141 lb 12 oz (64.3 kg)    Physical Exam  Constitutional: She appears well-developed. No distress.  HENT:  Head: Normocephalic.  Right Ear: External ear normal.  Left Ear: External ear normal.  Nose: Nose normal.  Mouth/Throat: Oropharynx is clear and moist.  Eyes: Pupils are equal, round, and reactive to light. Conjunctivae are normal. Right eye exhibits no discharge. Left eye exhibits no discharge.  Neck: Normal range of motion. Neck supple. No JVD present. No tracheal deviation present. No thyromegaly present.  Cardiovascular: Normal rate, regular rhythm and normal heart sounds.   Pulmonary/Chest: No stridor. No respiratory distress. She has no wheezes.  Abdominal: Soft. Bowel sounds are normal. She exhibits no distension and no mass. There is no tenderness. There is no rebound and no guarding.  Musculoskeletal: She exhibits no edema or tenderness.  Lymphadenopathy:    She has no cervical adenopathy.  Neurological: She displays normal reflexes. No cranial nerve deficit. She exhibits normal muscle tone. Coordination normal.  Skin: No rash noted. No erythema.  Psychiatric: She has a  normal mood and affect. Her behavior is normal. Judgment and thought content normal.    Lab Results  Component Value Date   WBC 4.0 12/10/2016   HGB 12.9 12/10/2016   HCT 39.1 12/10/2016   PLT 273.0 12/10/2016   GLUCOSE 85 12/10/2016   CHOL 218 (H) 12/10/2016   TRIG 148.0 12/10/2016   HDL 71.90 12/10/2016   LDLCALC 116 (H) 12/10/2016   ALT 24 12/10/2016   AST 39 (H) 12/10/2016   NA 138 12/10/2016   K 3.9 12/10/2016   CL 103 12/10/2016    CREATININE 0.79 12/10/2016   BUN 18 12/10/2016   CO2 29 12/10/2016   TSH <0.01 (L) 06/03/2017   INR 0.99 09/22/2012    Dg Wrist Complete Left  Result Date: 02/26/2017 CLINICAL DATA:  46 year old female with history of left wrist pain and swelling after fall 1 day ago. EXAM: LEFT WRIST - COMPLETE 3+ VIEW COMPARISON:  No priors. FINDINGS: Four views of the left wrist demonstrate a mildly comminuted, minimally impacted, nondisplaced intra-articular fracture of the distal radius involving predominantly the metaphysis but with extension to the articular surface. Distal ulna is intact. Carpals appear intact. Overlying soft tissues are markedly swollen. IMPRESSION: 1. Mildly comminuted, minimally impacted, nondisplaced intra-articular fracture of the distal left radius, as above. Electronically Signed   By: Vinnie Langton M.D.   On: 02/26/2017 12:53    Assessment & Plan:   There are no diagnoses linked to this encounter. I am having [name] maintain her aspirin, Vitamin B-12, Vitamin D3, ibuprofen, propranolol, methimazole, and traMADol.  No orders of the defined types were placed in this encounter.    Follow-up: No Follow-up on file.  Walker Kehr, MD

## 2017-07-08 ENCOUNTER — Other Ambulatory Visit: Payer: Self-pay | Admitting: Internal Medicine

## 2018-07-08 ENCOUNTER — Telehealth: Payer: Self-pay

## 2018-07-08 ENCOUNTER — Other Ambulatory Visit: Payer: BC Managed Care – PPO

## 2018-07-08 NOTE — Telephone Encounter (Signed)
Called patient this morning to discuss note from MD- need to know if she would like to f/u with PCP or come in for an office visit since she has not seen Dr. Cruzita Lederer in over a year- LVM requesting patient call back to discuss

## 2018-07-08 NOTE — Telephone Encounter (Signed)
Patient came for labs today and has decided to f/u with her PCP

## 2018-07-21 ENCOUNTER — Other Ambulatory Visit: Payer: Self-pay | Admitting: Obstetrics and Gynecology

## 2018-07-21 DIAGNOSIS — N6489 Other specified disorders of breast: Secondary | ICD-10-CM

## 2018-07-26 ENCOUNTER — Ambulatory Visit
Admission: RE | Admit: 2018-07-26 | Discharge: 2018-07-26 | Disposition: A | Discharge status: Home / Self Care | Payer: BC Managed Care – PPO | Source: Ambulatory Visit | Attending: Obstetrics and Gynecology | Admitting: Obstetrics and Gynecology

## 2018-07-26 DIAGNOSIS — N6489 Other specified disorders of breast: Secondary | ICD-10-CM

## 2018-08-29 ENCOUNTER — Encounter: Payer: Self-pay | Admitting: Internal Medicine

## 2018-08-29 ENCOUNTER — Ambulatory Visit (INDEPENDENT_AMBULATORY_CARE_PROVIDER_SITE_OTHER): Payer: BC Managed Care – PPO | Admitting: Internal Medicine

## 2018-08-29 ENCOUNTER — Other Ambulatory Visit (INDEPENDENT_AMBULATORY_CARE_PROVIDER_SITE_OTHER): Payer: BC Managed Care – PPO

## 2018-08-29 VITALS — BP 118/74 | HR 67 | Temp 98.0°F | Ht 62.0 in | Wt 152.0 lb

## 2018-08-29 DIAGNOSIS — I671 Cerebral aneurysm, nonruptured: Secondary | ICD-10-CM | POA: Diagnosis not present

## 2018-08-29 DIAGNOSIS — E538 Deficiency of other specified B group vitamins: Secondary | ICD-10-CM

## 2018-08-29 DIAGNOSIS — E0500 Thyrotoxicosis with diffuse goiter without thyrotoxic crisis or storm: Secondary | ICD-10-CM

## 2018-08-29 DIAGNOSIS — Z0000 Encounter for general adult medical examination without abnormal findings: Secondary | ICD-10-CM

## 2018-08-29 DIAGNOSIS — Z23 Encounter for immunization: Secondary | ICD-10-CM

## 2018-08-29 LAB — URINALYSIS, ROUTINE W REFLEX MICROSCOPIC
Bilirubin Urine: NEGATIVE
HGB URINE DIPSTICK: NEGATIVE
Ketones, ur: NEGATIVE
Nitrite: NEGATIVE
PH: 7.5 (ref 5.0–8.0)
RBC / HPF: NONE SEEN (ref 0–?)
Specific Gravity, Urine: 1.01 (ref 1.000–1.030)
TOTAL PROTEIN, URINE-UPE24: NEGATIVE
Urine Glucose: NEGATIVE
Urobilinogen, UA: 0.2 (ref 0.0–1.0)

## 2018-08-29 LAB — BASIC METABOLIC PANEL
BUN: 18 mg/dL (ref 6–23)
CHLORIDE: 102 meq/L (ref 96–112)
CO2: 30 mEq/L (ref 19–32)
Calcium: 10.1 mg/dL (ref 8.4–10.5)
Creatinine, Ser: 0.81 mg/dL (ref 0.40–1.20)
GFR: 80.89 mL/min (ref >60.00)
Glucose, Bld: 101 mg/dL — ABNORMAL HIGH (ref 70–99)
POTASSIUM: 3.9 meq/L (ref 3.5–5.1)
Sodium: 139 mEq/L (ref 135–145)

## 2018-08-29 LAB — HEPATIC FUNCTION PANEL
ALBUMIN: 4.4 g/dL (ref 3.5–5.2)
ALT: 11 U/L (ref 0–35)
AST: 13 U/L (ref 0–37)
Alkaline Phosphatase: 60 U/L (ref 39–117)
BILIRUBIN DIRECT: 0 mg/dL (ref 0.0–0.3)
TOTAL PROTEIN: 7.7 g/dL (ref 6.0–8.3)
Total Bilirubin: 0.3 mg/dL (ref 0.2–1.2)

## 2018-08-29 LAB — LIPID PANEL
CHOLESTEROL: 179 mg/dL (ref 0–200)
HDL: 55.6 mg/dL (ref >39.00)
LDL CALC: 105 mg/dL — AB (ref 0–99)
NonHDL: 123.7
TRIGLYCERIDES: 95 mg/dL (ref 0.0–149.0)
Total CHOL/HDL Ratio: 3
VLDL: 19 mg/dL (ref 0.0–40.0)

## 2018-08-29 LAB — TSH

## 2018-08-29 LAB — CBC WITH DIFFERENTIAL/PLATELET
BASOS PCT: 1 % (ref 0.0–3.0)
Basophils Absolute: 0.1 10*3/uL (ref 0.0–0.1)
EOS ABS: 0.1 10*3/uL (ref 0.0–0.7)
Eosinophils Relative: 1.9 % (ref 0.0–5.0)
HCT: 37.2 % (ref 36.0–46.0)
Hemoglobin: 12.2 g/dL (ref 12.0–15.0)
Lymphocytes Relative: 47.9 % — ABNORMAL HIGH (ref 12.0–46.0)
Lymphs Abs: 2.4 10*3/uL (ref 0.7–4.0)
MCHC: 32.8 g/dL (ref 30.0–36.0)
MCV: 78 fl (ref 78.0–100.0)
Monocytes Absolute: 0.5 10*3/uL (ref 0.1–1.0)
Monocytes Relative: 9.3 % (ref 3.0–12.0)
NEUTROS ABS: 2 10*3/uL (ref 1.4–7.7)
Neutrophils Relative %: 39.9 % — ABNORMAL LOW (ref 43.0–77.0)
PLATELETS: 281 10*3/uL (ref 150.0–400.0)
RBC: 4.77 Mil/uL (ref 3.87–5.11)
RDW: 14.1 % (ref 11.5–15.5)
WBC: 5.1 10*3/uL (ref 4.0–10.5)

## 2018-08-29 LAB — T3, FREE: T3 FREE: 4.2 pg/mL (ref 2.3–4.2)

## 2018-08-29 LAB — VITAMIN B12: VITAMIN B 12: 679 pg/mL (ref 211–911)

## 2018-08-29 LAB — T4, FREE: FREE T4: 1.4 ng/dL (ref 0.60–1.60)

## 2018-08-29 NOTE — Assessment & Plan Note (Signed)
Labs

## 2018-08-29 NOTE — Assessment & Plan Note (Signed)
We discussed age appropriate health related issues, including available/recomended screening tests and vaccinations. We discussed a need for adhering to healthy diet and exercise. Labs were ordered to be later reviewed . All questions were answered.   

## 2018-08-29 NOTE — Assessment & Plan Note (Signed)
Will ref to see Dr Estanislado Pandy for a f/u No sx's

## 2018-08-29 NOTE — Assessment & Plan Note (Signed)
On Vit B12 

## 2018-08-29 NOTE — Progress Notes (Signed)
Subjective:  Patient ID: Linda Stewart, female    DOB: 1970/10/26  Age: 47 y.o. MRN: [id number]  CC: No chief complaint on file.   HPI Linda Stewart presents for a well exam F/u brain aneurism and Grave's disease  Outpatient Medications Prior to Visit  Medication Sig Dispense Refill  . aspirin 325 MG EC tablet Take 325 mg by mouth daily.      . Cholecalciferol (VITAMIN D3) 2000 units TABS Take 1 capsule by mouth daily.    . Cyanocobalamin (VITAMIN B-12) 500 MCG SUBL Place 1 tablet (500 mcg total) under the tongue 1 day or 1 dose. 100 tablet 3  . ibuprofen (ADVIL,MOTRIN) 800 MG tablet Take 1 tablet (800 mg total) by mouth every 8 (eight) hours as needed for moderate pain. 40 tablet 0  . methimazole (TAPAZOLE) 5 MG tablet TAKE ONE TABLET BY MOUTH THREE TIMES DAILY 135 tablet 2  . methimazole (TAPAZOLE) 5 MG tablet Take 1 tablet (5 mg total) by mouth 2 (two) times daily. 60 tablet 1  . propranolol (INDERAL) 10 MG tablet Take 1 tablet (10 mg total) by mouth 2 (two) times daily. Overdue for yearly physical must see MD for refills 60 tablet 0  . traMADol (ULTRAM) 50 MG tablet Take 1 tablet (50 mg total) by mouth every 8 (eight) hours as needed. 30 tablet 1   No facility-administered medications prior to visit.     ROS: Review of Systems  Constitutional: Negative for activity change, appetite change, chills, fatigue and unexpected weight change.  HENT: Negative for congestion, mouth sores and sinus pressure.   Eyes: Negative for visual disturbance.  Respiratory: Negative for cough and chest tightness.   Gastrointestinal: Negative for abdominal pain and nausea.  Genitourinary: Negative for difficulty urinating, frequency and vaginal pain.  Musculoskeletal: Negative for back pain and gait problem.  Skin: Negative for pallor and rash.  Neurological: Negative for dizziness, tremors, weakness, numbness and headaches.  Psychiatric/Behavioral: Negative for confusion and sleep  disturbance. The patient is not nervous/anxious.     Objective:  BP 118/74 (BP Location: Left Arm, Patient Position: Sitting, Cuff Size: Normal)   Pulse 67   Temp 98 F (36.7 C) (Oral)   Ht 5\' 2"  (1.575 m)   Wt 152 lb (68.9 kg)   SpO2 98%   BMI 27.80 kg/m   BP Readings from Last 3 Encounters:  08/29/18 118/74  06/22/17 118/74  02/26/17 118/70    Wt Readings from Last 3 Encounters:  08/29/18 152 lb (68.9 kg)  06/22/17 148 lb (67.1 kg)  02/26/17 146 lb (66.2 kg)    Physical Exam  Constitutional: She appears well-developed. No distress.  HENT:  Head: Normocephalic.  Right Ear: External ear normal.  Left Ear: External ear normal.  Nose: Nose normal.  Mouth/Throat: Oropharynx is clear and moist.  Eyes: Pupils are equal, round, and reactive to light. Conjunctivae are normal. Right eye exhibits no discharge. Left eye exhibits no discharge.  Neck: Normal range of motion. Neck supple. No JVD present. No tracheal deviation present. No thyromegaly present.  Cardiovascular: Normal rate, regular rhythm and normal heart sounds.  Pulmonary/Chest: No stridor. No respiratory distress. She has no wheezes.  Abdominal: Soft. Bowel sounds are normal. She exhibits no distension and no mass. There is no tenderness. There is no rebound and no guarding.  Musculoskeletal: She exhibits no edema or tenderness.  Lymphadenopathy:    She has no cervical adenopathy.  Neurological: She displays normal reflexes. No cranial nerve  deficit. She exhibits normal muscle tone. Coordination normal.  Skin: No rash noted. No erythema.  Psychiatric: She has a normal mood and affect. Her behavior is normal. Judgment and thought content normal.    Lab Results  Component Value Date   WBC 4.0 12/10/2016   HGB 12.9 12/10/2016   HCT 39.1 12/10/2016   PLT 273.0 12/10/2016   GLUCOSE 85 12/10/2016   CHOL 218 (H) 12/10/2016   TRIG 148.0 12/10/2016   HDL 71.90 12/10/2016   LDLCALC 116 (H) 12/10/2016   ALT 24  12/10/2016   AST 39 (H) 12/10/2016   NA 138 12/10/2016   K 3.9 12/10/2016   CL 103 12/10/2016   CREATININE 0.79 12/10/2016   BUN 18 12/10/2016   CO2 29 12/10/2016   TSH <0.01 (L) 06/03/2017   INR 0.99 09/22/2012    US Breast Ltd Uni Right Inc Axilla  Result Date: 07/26/2018 CLINICAL DATA:  Right breast focal asymmetry seen on most recent screening mammography. EXAM: DIGITAL DIAGNOSTIC RIGHT MAMMOGRAM WITH CAD AND TOMO ULTRASOUND RIGHT BREAST COMPARISON:  Previous exam(s). ACR Breast Density Category c: The breast tissue is heterogeneously dense, which may obscure small masses. FINDINGS: Additional mammographic views of the right breast demonstrate no suspicious masses or areas of architectural distortion. The previously questioned asymmetry in the upper inner right breast effaces to glandular tissue. Mammographic images were processed with CAD. On physical exam, no suspicious masses are palpated. Targeted ultrasound is performed, showing no suspicious masses or shadowing lesions. IMPRESSION: No mammographic or sonographic evidence of malignancy in the right breast. RECOMMENDATION: Screening mammogram in one year.(Code:SM-B-01Y) I have discussed the findings and recommendations with the patient. Results were also provided in writing at the conclusion of the visit. If applicable, a reminder letter will be sent to the patient regarding the next appointment. BI-RADS CATEGORY  1: Negative. Electronically Signed   By: Fidela Salisbury M.D.   On: 07/26/2018 15:44   Mm Diag Breast Tomo Uni Right  Result Date: 07/26/2018 CLINICAL DATA:  Right breast focal asymmetry seen on most recent screening mammography. EXAM: DIGITAL DIAGNOSTIC RIGHT MAMMOGRAM WITH CAD AND TOMO ULTRASOUND RIGHT BREAST COMPARISON:  Previous exam(s). ACR Breast Density Category c: The breast tissue is heterogeneously dense, which may obscure small masses. FINDINGS: Additional mammographic views of the right breast demonstrate no  suspicious masses or areas of architectural distortion. The previously questioned asymmetry in the upper inner right breast effaces to glandular tissue. Mammographic images were processed with CAD. On physical exam, no suspicious masses are palpated. Targeted ultrasound is performed, showing no suspicious masses or shadowing lesions. IMPRESSION: No mammographic or sonographic evidence of malignancy in the right breast. RECOMMENDATION: Screening mammogram in one year.(Code:SM-B-01Y) I have discussed the findings and recommendations with the patient. Results were also provided in writing at the conclusion of the visit. If applicable, a reminder letter will be sent to the patient regarding the next appointment. BI-RADS CATEGORY  1: Negative. Electronically Signed   By: Fidela Salisbury M.D.   On: 07/26/2018 15:44    Assessment & Plan:   There are no diagnoses linked to this encounter.   No orders of the defined types were placed in this encounter.    Follow-up: No follow-ups on file.  Walker Kehr, MD

## 2018-09-05 ENCOUNTER — Other Ambulatory Visit (HOSPITAL_COMMUNITY): Payer: Self-pay | Admitting: Interventional Radiology

## 2018-09-05 ENCOUNTER — Telehealth (HOSPITAL_COMMUNITY): Payer: Self-pay

## 2018-09-05 DIAGNOSIS — I729 Aneurysm of unspecified site: Secondary | ICD-10-CM

## 2018-09-05 NOTE — Telephone Encounter (Signed)
Called to schedule f/u consult, no answer, left vm. AW  

## 2018-09-09 ENCOUNTER — Ambulatory Visit (HOSPITAL_COMMUNITY)
Admission: RE | Admit: 2018-09-09 | Discharge: 2018-09-09 | Disposition: A | Discharge status: Home / Self Care | Payer: BC Managed Care – PPO | Source: Ambulatory Visit | Attending: Interventional Radiology | Admitting: Interventional Radiology

## 2018-09-09 DIAGNOSIS — I729 Aneurysm of unspecified site: Secondary | ICD-10-CM

## 2018-09-09 NOTE — Consult Note (Addendum)
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  Referring Physician(s): Plotnikov, Tyrone Apple  Supervising Physician: Luanne Bras  Patient Status: [facility] - Out-pt  History of Present Illness: Linda Stewart is a 47 y.o. female with a past medical history of headaches, CVA 2003, ovarian cysts, uterine polyp, endometriosis, and antithrombin 3 deficiency. She is known to [facility] and has been followed by Dr. Estanislado Pandy since 11/2006. She first presented to our department for an image-guided diagnostic cerebral angiogram with embolization of her left ICA superior hypophyseal aneurysm using stent ([employer]) assisted coiling 11/22/2006. She was followed with routine imaging scans and diagnostic cerebral angiograms. However, she was lost to follow-up, and was last seen by Dr. Estanislado Pandy for an image-guided diagnostic cerebral angiogram 09/22/2012.  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 Patient awake and alert sitting in chair with no complaints at this time. Denies headache, weakness, numbness/tingling, dizziness, vision changes, hearing changes, tinnitus, or speech difficulty.  Patient is currently taking Aspirin 325 mg once daily.   Past Medical History:  Diagnosis Date  . 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[place])   . Endometriosis determined by laparoscopy 02/28/2016  . 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 History of cerebral aneurysm repair dx 10-18-2006 via MRI/MRA  5.22mm x 4.33mm saccular   11-16-2006  stent-assisted coiling LICA intracranial superior hypophyseal region  . History of CVA (cerebrovascular accident)    2003  . History of ovarian cyst   . Pelvic pain in female 12/05/2015  . Uterine polyp 12/05/2015    Past Surgical History:  Procedure Laterality Date  . HYSTEROSCOPY W/D&C N/A 02/28/2016   Procedure: DILATATION  AND CURETTAGE /DIAGNOSTIC HYSTEROSCOPY;  Surgeon: Janyth Contes, MD;  Location: [facility];  Service: Gynecology;  Laterality: N/A;  . LAPAROSCOPIC OVARIAN CYSTECTOMY  2001  . LAPAROSCOPY N/A 02/28/2016   Procedure: OPERATIVE LAPAROSCOPY; LYSIS OF ADHESIONS;  Surgeon: Janyth Contes, MD;  Location: [facility];  Service: Gynecology;  Laterality: N/A;  . 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  11-16-2006   dr deveshwar   coiling  (general anesthesia)    Allergies: Patient has no known allergies.  Medications: Prior to Admission medications   Medication Sig Start Date End Date Taking? Authorizing Provider  aspirin 325 MG EC tablet Take 325 mg by mouth daily.      [provider]  Cholecalciferol (VITAMIN D3) 2000 units TABS Take 1 capsule by mouth daily.    [provider]  Cyanocobalamin (VITAMIN B-12) 500 MCG SUBL Place 1 tablet (500 mcg total) under the tongue 1 day or 1 dose. 11/25/11   Plotnikov, [name], MD  ibuprofen (ADVIL,MOTRIN) 800 MG tablet Take 1 tablet (800 mg total) by mouth every 8 (eight) hours as needed for moderate pain. 02/28/16   Bovard-Stuckert, Jeral Fruit, MD  methimazole (TAPAZOLE) 5 MG tablet TAKE ONE TABLET BY MOUTH THREE TIMES DAILY 07/09/17   Philemon Kingdom, MD     Family History  Problem Relation Age of Onset  . Leukemia Mother   . Cancer Mother 73       leukemia  . Lung cancer Father        SMOKER  . Cancer Father 4       lung ca  . Heart disease Other   . Stroke Other   . Alcohol abuse Other   .  Breast cancer Other     Social History   Socioeconomic History  . Marital status: Married    Spouse name: Not on file  . Number of children: 2  . Years of education: Not on file  . Highest education level: Not on file  Occupational History  . Occupation: Producer, television/film/video: [employer]  Social Needs    . Financial resource strain: Not on file  . Food insecurity:    Worry: Not on file    Inability: Not on file  . Transportation needs:    Medical: Not on file    Non-medical: Not on file  Tobacco Use  . Smoking status: Never Smoker  . Smokeless tobacco: Never Used  Substance and Sexual Activity  . Alcohol use: No  . Drug use: No  . Sexual activity: Yes  Lifestyle  . Physical activity:    Days per week: Not on file    Minutes per session: Not on file  . Stress: Not on file  Relationships  . Social connections:    Talks on phone: Not on file    Gets together: Not on file    Attends religious service: Not on file    Active member of club or organization: Not on file    Attends meetings of clubs or organizations: Not on file    Relationship status: Not on file  Other Topics Concern  . Not on file  Social History Narrative   Immigrated to [place] 1999 from [place]           Review of Systems: A 12 point ROS discussed and pertinent positives are indicated in the HPI above.  All other systems are negative.  Review of Systems  Constitutional: Negative for chills and fever.  HENT: Negative for hearing loss and tinnitus.   Eyes: Negative for visual disturbance.  Respiratory: Negative for shortness of breath and wheezing.   Cardiovascular: Negative for chest pain and palpitations.  Neurological: Negative for dizziness, syncope, weakness, numbness and headaches.  Psychiatric/Behavioral: Negative for behavioral problems and confusion.    Vital Signs: There were no vitals taken for this visit.  Physical Exam  Constitutional: She is oriented to person, place, and time. She appears well-developed and well-nourished. No distress.  Pulmonary/Chest: Effort normal. No respiratory distress.  Neurological: She is alert and oriented to person, place, and time.  Skin: Skin is warm and dry.  Psychiatric: She has a normal mood and affect. Her behavior is normal. Judgment and thought content  normal.     Imaging: No results found.  Labs:  CBC: Recent Labs    08/29/18 1651  WBC 5.1  HGB 12.2  HCT 37.2  PLT 281.0    COAGS: No results for input(s): INR, APTT in the last 8760 hours.  BMP: Recent Labs    08/29/18 1651  NA 139  K 3.9  CL 102  CO2 30  GLUCOSE 101*  BUN 18  CALCIUM 10.1  CREATININE 0.81    LIVER FUNCTION TESTS: Recent Labs    08/29/18 1651  BILITOT 0.3  AST 13  ALT 11  ALKPHOS 60  PROT 7.7  ALBUMIN 4.4    TUMOR MARKERS: No results for input(s): AFPTM, CEA, CA199, CHROMGRNA in the last 8760 hours.  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 Estanislado Pandy. Reviewed imaging with patient. Brought to her attention was her left ICA superior hypophyseal aneurysm, both  prior to and following treatment. Informed patient that our most recent imaging on her is from an image-guided diagnostic cerebral angiogram 09/22/2012. Informed patient that the best course of management for her left ICA superior hypophyseal aneurysm is with routine imaging scans to monitor for changes.  Plan for follow-up MRI/MRA brain/head (without contrast) ASAP. Informed patient that our schedulers will call her to set up this imaging scan. Instructed patient to continue taking Aspirin 325 mg once daily.  All questions answered and concerns addressed. Patient conveys understanding and agrees with plan.  Thank you for this interesting consult.  I greatly enjoyed meeting Linda Stewart and look forward to participating in their care.  A copy of this report was sent to the requesting provider on this date.  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

## 2018-09-15 ENCOUNTER — Other Ambulatory Visit (HOSPITAL_COMMUNITY): Payer: Self-pay | Admitting: Interventional Radiology

## 2018-09-15 ENCOUNTER — Telehealth (HOSPITAL_COMMUNITY): Payer: Self-pay

## 2018-09-15 DIAGNOSIS — I729 Aneurysm of unspecified site: Secondary | ICD-10-CM

## 2018-09-15 NOTE — Telephone Encounter (Signed)
Called to schedule mri, no answer, left vm. AW 

## 2018-09-16 ENCOUNTER — Encounter: Payer: Self-pay | Admitting: Internal Medicine

## 2018-09-16 ENCOUNTER — Ambulatory Visit (INDEPENDENT_AMBULATORY_CARE_PROVIDER_SITE_OTHER): Payer: BC Managed Care – PPO | Admitting: Internal Medicine

## 2018-09-16 VITALS — BP 110/60 | HR 102 | Ht 62.0 in | Wt 150.0 lb

## 2018-09-16 DIAGNOSIS — E0500 Thyrotoxicosis with diffuse goiter without thyrotoxic crisis or storm: Secondary | ICD-10-CM | POA: Diagnosis not present

## 2018-09-16 DIAGNOSIS — R000 Tachycardia, unspecified: Secondary | ICD-10-CM | POA: Diagnosis not present

## 2018-09-16 LAB — T3, FREE: T3, Free: 5.1 pg/mL — ABNORMAL HIGH (ref 2.3–4.2)

## 2018-09-16 LAB — T4, FREE: Free T4: 1.42 ng/dL (ref 0.60–1.60)

## 2018-09-16 LAB — TSH: TSH: <0.01 u[IU]/mL — ABNORMAL LOW (ref 0.35–4.50)

## 2018-09-16 MED ORDER — METHIMAZOLE 5 MG PO TABS: 10.0000 mg | ORAL_TABLET | Freq: Every day | ORAL | 2 refills | Status: DC

## 2018-09-16 NOTE — Progress Notes (Addendum)
Patient ID: Linda Stewart, female   DOB: Oct 02, 1971, 47 y.o.   MRN: [id number]    HPI  Linda Stewart is a 47 y.o.-year-old female, returning for f/u for uncontrolled Graves ds. Last visit 1 year and 9 months ago.  Reviewed and addended hx: I saw the pt first in 2015, when she presented with thyrotoxicosis, but this was improving and I advised her to return for labs in 2 months, but did not intervene at that time. Pt. Was lost for f/u.   She then presented to PCP in 2017 >> still thyrotoxic >> advised to return to see me.  She was started on Propranolol 10 mg 3x a day by PCP.  We were able to decrease this gradually and then stop  In 05/2018, we increased her methimazole from 5 mg twice a day to 10 mg twice a day.  Subsequent labs were improved.  She continues on this dose, but she forgets the med >> mostly takes it once a day.  Reviewed patient's TFTs: Lab Results  Component Value Date   TSH <0.01 (L) 08/29/2018   TSH <0.01 (L) 06/03/2017   TSH 0.37 01/28/2017   TSH 4.110 12/08/2016   TSH 0.01 (L) 09/07/2016   TSH 0.00 (L) 08/17/2016   TSH 0.02 (L) 10/30/2015   TSH 0.19 (L) 11/20/2014   TSH 0.00 Repeated and verified X2. (L) 01/15/2014   TSH 0.02 (L) 11/23/2013   FREET4 1.40 08/29/2018   FREET4 1.70 (H) 06/03/2017   FREET4 0.92 01/28/2017   FREET4 0.75 (L) 12/08/2016   FREET4 4.44 (H) 09/07/2016   FREET4 4.58 (H) 08/17/2016   FREET4 1.19 11/20/2014   FREET4 1.22 01/15/2014   FREET4 1.67 (H) 11/23/2013    Her Graves' antibodies are abnormal: Component     Latest Ref Rng & Units 09/07/2016  TSI     <140 % baseline 630 (H)   Pt denies: - feeling nodules in neck - hoarseness - dysphagia - choking - SOB with lying down  Pt does not have a FH of thyroid ds. No FH of thyroid cancer. No h/o radiation tx to head or neck.  No herbal supplements. No Biotin use. No recent steroids use.   She also has a history of CVA - when in [place] (~47 y/o) - she was told at that time  that her thyroid was abnormal. She also has an aneurysm - previously coiled. She has ATIII deficiency, B12 def.   ROS: Constitutional: no weight gain/no weight loss, no fatigue, no subjective hyperthermia, no subjective hypothermia Eyes: no blurry vision, no xerophthalmia ENT: no sore throat, + see HPI Cardiovascular: no CP/no SOB/no palpitations/+ leg swelling Respiratory: no cough/no SOB/no wheezing Gastrointestinal: no N/no V/no D/no C/no acid reflux Musculoskeletal: no muscle aches/no joint aches Skin: no rashes, no hair loss Neurological: no tremors/no numbness/no tingling/no dizziness  I reviewed pt's medications, allergies, PMH, social hx, family hx, and changes were documented in the history of present illness. Otherwise, unchanged from my initial visit note.  Past Medical History:  Diagnosis Date  . 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 Endometriosis determined by laparoscopy 02/28/2016  . 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 History of cerebral aneurysm repair dx 10-18-2006 via MRI/MRA  5.26mm x 4.29mm saccular   11-16-2006  stent-assisted coiling LICA intracranial superior hypophyseal region  . History of CVA (cerebrovascular accident)    2003  . History of ovarian cyst   . Pelvic pain  in female 12/05/2015  . Uterine polyp 12/05/2015   Past Surgical History:  Procedure Laterality Date  . HYSTEROSCOPY W/D&C N/A 02/28/2016   Procedure: DILATATION AND CURETTAGE /DIAGNOSTIC HYSTEROSCOPY;  Surgeon: Janyth Contes, MD;  Location: [facility];  Service: Gynecology;  Laterality: N/A;  . LAPAROSCOPIC OVARIAN CYSTECTOMY  2001  . LAPAROSCOPY N/A 02/28/2016   Procedure: OPERATIVE LAPAROSCOPY; LYSIS OF ADHESIONS;  Surgeon: Janyth Contes, MD;  Location: [facility];  Service: Gynecology;  Laterality: N/A;  . 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   11-16-2006   dr deveshwar   coiling  (general anesthesia)   Social History   Social History  . Marital status: Married    Spouse name: N/A  . Number of children: 2: 15 and 44 y/o in 2017   Occupational History  . Office Support - Environmental consultant    Social History Main Topics  . Smoking status: Never Smoker  . Smokeless tobacco: Never Used  . Alcohol use No  . Drug use: Tequila, beer - occasionally   Social History Narrative   Immigrated to [place] 1999 from [place]   Current Outpatient Medications on File Prior to Visit  Medication Sig Dispense Refill  . aspirin 325 MG EC tablet Take 325 mg by mouth daily.      . Cholecalciferol (VITAMIN D3) 2000 units TABS Take 1 capsule by mouth daily.    . Cyanocobalamin (VITAMIN B-12) 500 MCG SUBL Place 1 tablet (500 mcg total) under the tongue 1 day or 1 dose. 100 tablet 3  . ibuprofen (ADVIL,MOTRIN) 800 MG tablet Take 1 tablet (800 mg total) by mouth every 8 (eight) hours as needed for moderate pain. 40 tablet 0  . methimazole (TAPAZOLE) 5 MG tablet TAKE ONE TABLET BY MOUTH THREE TIMES DAILY 135 tablet 2   No current facility-administered medications on file prior to visit.    No Known Allergies Family History  Problem Relation Age of Onset  . Leukemia Mother   . Cancer Mother 68       leukemia  . Lung cancer Father        SMOKER  . Cancer Father 37       lung ca  . Heart disease Other   . Stroke Other   . Alcohol abuse Other   . Breast cancer Other    PE: BP 110/60   Pulse (!) 102   Ht 5\' 2"  (1.575 m) Comment: measured  Wt 150 lb (68 kg)   SpO2 98%   BMI 27.44 kg/m  Wt Readings from Last 3 Encounters:  09/16/18 150 lb (68 kg)  08/29/18 152 lb (68.9 kg)  06/22/17 148 lb (67.1 kg)   Constitutional: Normal weight, in NAD Eyes: PERRLA, EOMI, no exophthalmos, no lid lag, no stare ENT: moist mucous membranes, no thyromegaly, no cervical lymphadenopathy Cardiovascular: tachycardia, RR, No MRG Respiratory: CTA  B Gastrointestinal: abdomen soft, NT, ND, BS+ Musculoskeletal: no deformities, strength intact in all 4 Skin: moist, warm, no rashes Neurological: no tremor with outstretched hands, DTR normal in all 4  ASSESSMENT: 1. Graves ds.  2.  Tachycardia  PLAN:  1. Patient with a history of Graves' disease returning after a 1 year and 9 months absence.  She initially had a low TSH, with thyrotoxic symptoms: Weight loss, tremors, hyper defecation, palpitations, anxiety, diagnosed as Graves' disease, based on her elevated TSI antibodies.  Her symptoms resolved after starting methimazole and propranolol.  We  did stop propranolol, and at last visit we were able to decrease methimazole to 5 mg twice a day.  Her last visit was 12/2016 and did not return for another visit.  We did stay in touch earlier this year and increased the methimazole to 10 mg twice a day on 06/03/2018.  Subsequent TFTs on 08/29/2018 were improved, with normal free hormones but still undetectable TSH -She is currently on methimazole 10 mg twice a day.  She has no side effects from the medication. -At this visit, we will recheck her TFTs and also her TSI's to see if we can start decreasing the dose. -At this visit, we again discussed about possible modalities of treatment for the above conditions, to include continuing methimazole, RAI treatment, or, last resort, surgery.  For now, I suggested RAI treatment and I explained that hypothyroidism may happen after the treatment and this is treated with levothyroxine.  -She agrees to do radioactive iodine treatment.  For now, we will need to start by checking a thyroid uptake and scan.  I ordered this today.  After this is back, will order the RAI treatment.  I advised her to come back a month after this for repeat TFTs.  I advised her to stop the methimazole 5 days prior to the uptake and scan and also prior to RAI treatment. -Discussed about radioactive precautions after her treatment -I will see  her back in 4 months but most likely sooner for labs  2.  Tachycardia -Pulse 102 today -She tells me that she is upset that she had a stressful day at work -I advised her to check her pulse at home and let me know if it is higher than 90 at rest.  In that case, we need to start a beta-blocker.   Patient Instructions  Please continue: - Methimazole 10 mg daily  Please stop at the lab.  We will schedule a thyroid Uptake and scan - please stop the Methimazole 5 days prior to the test and restart it immediately afterwards.  Also, please stop Methimazole 5 days prior to the RAI treatment, and do not restart Methimazole afterwards.  Please come back for a follow-up appointment in 3 months but for labs 1 month after the RAI treatment.  Please check your pulse at home at rest and let me know if >90.  Component     Latest Ref Rng & Units 09/16/2018  TSH     0.35 - 4.50 uIU/mL <0.01 (L)  T4,Free(Direct)     0.60 - 1.60 ng/dL 1.42  Triiodothyronine,Free,Serum     2.3 - 4.2 pg/mL 5.1 (H)  TSI     <140 % baseline 205 (H)   Her TSH is still low, while the free T4 is normal.  Free T3 is slightly above the upper limit of normal.  Since we are planning to proceed with RAI treatment, I will not change her methimazole dose for now. Her Graves' antibodies have decreased significantly, which is good news!  Reading Physician Reading Date Result Priority  Lavonia Dana, MD 10/14/2018     Narrative & Impression    CLINICAL DATA:  Graves disease, thyrotoxicosis, irritability, weight loss, diarrhea, difficulty sleeping, hand tremors, hair loss, palpitations, fatigue and tiredness, weakness, dry course air and skin, increased thirst  EXAM: THYROID SCAN AND UPTAKE - 4 AND 24 HOURS  TECHNIQUE: Following oral administration of I-123 capsule, anterior planar imaging was acquired at 24 hours. Thyroid uptake was calculated with a thyroid probe at 4-6 hours  and 24 hours.  RADIOPHARMACEUTICALS:   433 uCi I-123 sodium iodide p.o.  COMPARISON:  None  FINDINGS: Homogeneous tracer distribution in both thyroid lobes.  No focal areas of increased or decreased tracer localization.  4 hour I-123 uptake = 54.1% (normal 5-20%)  24 hour I-123 uptake = 66.6% (normal 10-30%)  IMPRESSION: Elevated 4 hour and 24 hour radio iodine uptakes consistent with hyperthyroidism.  Normal thyroid scan.  Findings consistent with Graves disease.   Electronically Signed   By: Lavonia Dana M.D.   On: 10/14/2018 11:23   We will proceed with RAI treatment, as discussed.  We will have her stop methimazole 5 days before the treatment.  She will need to have labs again approximately 5 weeks after the treatment.  Philemon Kingdom, MD PhD [facility] Endocrinology

## 2018-09-16 NOTE — Patient Instructions (Addendum)
Please continue: - Methimazole 10 mg daily  Please stop at the lab.  We will schedule a thyroid Uptake and scan - please stop the Methimazole 5 days prior to the test and restart it immediately afterwards.  Also, please stop Methimazole 5 days prior to the RAI treatment, and do not restart Methimazole afterwards.  Please come back for a follow-up appointment in 3 months but for labs 1 month after the RAI treatment.  Please check your pulse at home at rest and let me know if >90.   Radioiodine (I-131) Therapy for Hyperthyroidism Radioiodine (I-131) therapy is a procedure to treat an overactive thyroid gland (hyperthyroidism). The thyroid is a gland in the neck that uses iodine to help control how the body uses food (metabolism). In this procedure, you swallow a pill or liquid that contains I-131. I-131 is manufactured (synthetic) iodine that gives off radiation. This destroys thyroid cells and reverses hyperthyroidism. Tell a health care provider about:  Any allergies you have.  All medicines you are taking, including vitamins, herbs, eye drops, creams, and over-the-counter medicines.  Any problems you or family members have had with anesthetic medicines.  Any blood disorders you have.  Any surgeries you have had.  Any medical conditions you have.  Whether you are pregnant, may be pregnant, or have gone through menopause, if this applies.  Whether you currently have children.  Whether you plan to have children in the next 2 years.  Any contact you have with children or pregnant women.  Your travel plans for the next 3 months.  Whether you pass through radiation detectors for work or travel. What are the risks? Generally, this is a safe procedure. However, problems may occur, including:  Damage to other structures or organs, such as the salivary glands. This could lead to dry mouth and loss of taste.  Low sperm count, if this applies. This may lead to temporary  infertility.  Sore throat or neck pain. This is temporary.  Slightlyincreased risk of thyroid cancer.  Nausea or vomiting.  What happens before the procedure?  Ask your health care provider about changing or stopping your regular medicines. This is especially important if you are taking diabetes medicines, blood thinners, or thyroid medicines.  Women may be asked to take a pregnancy test.  Women who are breastfeeding should plan to stop at least 6 weeks before the procedure.  Follow instructions from your health care provider about eating or drinking restrictions.  Plan to avoid contact with others for 1 week after your treatment. It is most important to avoid contact with children and pregnant women. To do this, plan to stay home from work, arrange child care, and sleep alone, if these things apply to you.  Plan to drive yourself home after treatment. Do not take public transportation. If you need someone to drive you home, sit as far away from the driver as possible. What happens during the procedure?  You will be given a dose of I-131 to swallow. It may be a pill or a liquid.  Your thyroid gland will absorb the I-131 over the next 3 months. The treatment process will be complete in about 6 months. What happens after the procedure?  You may need to stay in the hospital for 24 hours after your treatment. This depends on the requirements in your state.  Follow instructions from your health care provider about: ? How to take care of yourself after the procedure. ? How to protect others from exposure to  radiation as it leaves your body. This information is not intended to replace advice given to you by your health care provider. Make sure you discuss any questions you have with your health care provider. Document Released: 02/07/2009 Document Revised: 02/25/2016 Document Reviewed: 01/16/2015 Elsevier Interactive Patient Education  2018 [employer].   Radioiodine (I-131) Therapy  for Hyperthyroidism, Care After Refer to this sheet in the next few weeks. These instructions provide you with information about caring for yourself after your procedure. Your health care provider may also give you more specific instructions. Your treatment has been planned according to current medical practices, but problems sometimes occur. Call your health care provider if you have any problems or questions after your procedure. What can I expect after the procedure? After your procedure, it is common to have a sore throat or mild neck pain for several months. Follow these instructions at home: For the First 48 Hours After the Procedure:  Do not use public bathrooms.  Flush twice after using the toilet.  Take a bath or shower every day.  Do not make food for other people.  Wash your sheets, towels, and clothes each day, by themselves. Pregnancy and Breastfeeding  Do not attempt to have children for as long as you are told by your health care provider. This may be for up to 1 year after your procedure. Use a method of birth control (contraception) to prevent pregnancy. Talk to your health care provider about what form of contraception is right for you.  Do not breastfeed for as long as you are told by your health care provider, if this applies. General instructions  Avoid close contact with other people. It is most important to avoid contact with pregnant women and children. Do this for 1 week after your procedure. ? Try to keep a distance of about 3 feet from others. ? Sleep alone. Do not have intimate contact. ? If you have children, arrange for child care. ? Do not ride in vehicles with other people. ? Do not stay in a hotel.  Take over-the-counter and prescription medicines only as told by your health care provider. This includes any thyroid medicines.  When traveling, carry a note from your health care provider to explain that you have had radioiodine therapy. This is because  radioactivity may set off detectors in airports or other places.  Do not share utensils, such as silverware, plates, or cups. Use disposable utensils, or clean your utensils separately from those of others.  Wash your hands often. If soap and water are not available, use hand sanitizer.  Drink enough fluid to keep your urine clear or pale yellow.  Keep all follow-up visits as told by your health care provider. This is important. Follow-up visits may be required every 1-2 months after treatment. Contact a health care provider if:   You have pain that gets worse or does not get better with medicine.  You have a dry mouth.  You lose your sense of taste.  You become pregnant within 1 year of your procedure, if this applies.  You feel unusually tired (fatigued).  You have very dry skin.  You start to lose your hair.  You have bowel movements that are less frequent or more difficult than usual (constipation).  You have unexplained weight gain.  You always feel cold. This information is not intended to replace advice given to you by your health care provider. Make sure you discuss any questions you have with your health care  provider. Document Released: 06/12/2015 Document Revised: 02/27/2016 Document Reviewed: 01/16/2015 Elsevier Interactive Patient Education  [employer].

## 2018-09-19 LAB — THYROID STIMULATING IMMUNOGLOBULIN: TSI: 205 % baseline — ABNORMAL HIGH (ref <140)

## 2018-09-21 ENCOUNTER — Encounter: Payer: Self-pay | Admitting: Internal Medicine

## 2018-09-30 ENCOUNTER — Ambulatory Visit (HOSPITAL_COMMUNITY)
Admission: RE | Admit: 2018-09-30 | Discharge: 2018-09-30 | Disposition: A | Discharge status: Home / Self Care | Payer: BC Managed Care – PPO | Source: Ambulatory Visit | Attending: Interventional Radiology | Admitting: Interventional Radiology

## 2018-09-30 ENCOUNTER — Encounter (HOSPITAL_COMMUNITY): Payer: Self-pay

## 2018-09-30 DIAGNOSIS — I671 Cerebral aneurysm, nonruptured: Secondary | ICD-10-CM | POA: Diagnosis present

## 2018-09-30 DIAGNOSIS — Q048 Other specified congenital malformations of brain: Secondary | ICD-10-CM | POA: Insufficient documentation

## 2018-09-30 DIAGNOSIS — I729 Aneurysm of unspecified site: Secondary | ICD-10-CM

## 2018-10-06 ENCOUNTER — Encounter (HOSPITAL_COMMUNITY): Payer: BC Managed Care – PPO

## 2018-10-06 ENCOUNTER — Other Ambulatory Visit (HOSPITAL_COMMUNITY): Payer: BC Managed Care – PPO

## 2018-10-07 ENCOUNTER — Other Ambulatory Visit (HOSPITAL_COMMUNITY): Payer: BC Managed Care – PPO

## 2018-10-10 ENCOUNTER — Telehealth (HOSPITAL_COMMUNITY): Payer: Self-pay

## 2018-10-10 NOTE — Telephone Encounter (Signed)
Pt agreed to f/u in 1 year with mra head wo. AW  

## 2018-10-13 ENCOUNTER — Encounter (HOSPITAL_COMMUNITY)
Admission: RE | Admit: 2018-10-13 | Discharge: 2018-10-13 | Disposition: A | Discharge status: Home / Self Care | Payer: BC Managed Care – PPO | Source: Ambulatory Visit | Attending: Internal Medicine | Admitting: Internal Medicine

## 2018-10-13 DIAGNOSIS — E0500 Thyrotoxicosis with diffuse goiter without thyrotoxic crisis or storm: Secondary | ICD-10-CM | POA: Diagnosis present

## 2018-10-13 MED ORDER — SODIUM IODIDE I-123 7.4 MBQ CAPS
433.0000 | ORAL_CAPSULE | Freq: Once | ORAL | Status: AC
  Administered 2018-10-13: 433 via ORAL

## 2018-10-14 ENCOUNTER — Other Ambulatory Visit: Payer: Self-pay | Admitting: Internal Medicine

## 2018-10-14 ENCOUNTER — Encounter (HOSPITAL_COMMUNITY)
Admission: RE | Admit: 2018-10-14 | Discharge: 2018-10-14 | Disposition: A | Discharge status: Home / Self Care | Payer: BC Managed Care – PPO | Source: Ambulatory Visit | Attending: Internal Medicine | Admitting: Internal Medicine

## 2018-10-14 DIAGNOSIS — E0500 Thyrotoxicosis with diffuse goiter without thyrotoxic crisis or storm: Secondary | ICD-10-CM

## 2018-10-14 NOTE — Addendum Note (Signed)
Addended by: Philemon Kingdom on: 10/14/2018 12:02 PM   Modules accepted: Orders

## 2018-11-04 ENCOUNTER — Ambulatory Visit (HOSPITAL_COMMUNITY)
Admission: RE | Admit: 2018-11-04 | Discharge: 2018-11-04 | Disposition: A | Discharge status: Home / Self Care | Payer: BC Managed Care – PPO | Source: Ambulatory Visit | Attending: Internal Medicine | Admitting: Internal Medicine

## 2018-11-04 DIAGNOSIS — E0590 Thyrotoxicosis, unspecified without thyrotoxic crisis or storm: Secondary | ICD-10-CM | POA: Diagnosis present

## 2018-11-04 LAB — PREGNANCY, URINE: Preg Test, Ur: NEGATIVE

## 2018-12-26 ENCOUNTER — Ambulatory Visit (INDEPENDENT_AMBULATORY_CARE_PROVIDER_SITE_OTHER): Payer: BC Managed Care – PPO | Admitting: Internal Medicine

## 2018-12-26 ENCOUNTER — Other Ambulatory Visit: Payer: Self-pay

## 2018-12-26 ENCOUNTER — Encounter: Payer: Self-pay | Admitting: Internal Medicine

## 2018-12-26 VITALS — BP 118/68 | HR 78 | Temp 98.0°F | Ht 62.0 in | Wt 154.0 lb

## 2018-12-26 DIAGNOSIS — E0500 Thyrotoxicosis with diffuse goiter without thyrotoxic crisis or storm: Secondary | ICD-10-CM

## 2018-12-26 NOTE — Progress Notes (Signed)
Patient ID: Linda Stewart, female   DOB: [birth year], 48 y.o.   MRN: [id number]    HPI  Linda Stewart is a 48 y.o.-year-old female, returning for f/u for uncontrolled Graves ds. Last visit 3 months ago.  I reviewed and addended history: I saw the pt first in 2015, when she presented with thyrotoxicosis, but this was improving and I advised her to return for labs in 2 months, but did not intervene at that time. Pt. Was lost for f/u.   She then presented to PCP in 2017 >> still thyrotoxic >> advised to return to see me.  She was started on Propranolol 10 mg 3x a day by PCP.  We were able to decrease this gradually and then stop  In 05/2018, we increased her methimazole from 5 mg twice a day to 10 mg twice a day.  Subsequent labs were improved.    In 09/2018, I suggested RAI treatment after she returned after long absence with very abnormal TFTs.  Thyroid uptake and scan (10/14/2018): Was consistent with Graves' disease: Elevated 4 hour (54.1%) and 24 hour (66.6) RAI uptake consistent with hyperthyroidism.  Normal thyroid scan   RAI treatment (11/04/2018)  She feels well after the treatment, without any symptoms, but she does feel fatigued if she does not sleep better lately due to the coronavirus pandemic.  Reviewed patient's TFTs: Lab Results  Component Value Date   TSH <0.01 (L) 09/16/2018   TSH <0.01 (L) 08/29/2018   TSH <0.01 (L) 06/03/2017   TSH 0.37 01/28/2017   TSH 4.110 12/08/2016   TSH 0.01 (L) 09/07/2016   TSH 0.00 (L) 08/17/2016   TSH 0.02 (L) 10/30/2015   TSH 0.19 (L) 11/20/2014   TSH 0.00 Repeated and verified X2. (L) 01/15/2014   FREET4 1.42 09/16/2018   FREET4 1.40 08/29/2018   FREET4 1.70 (H) 06/03/2017   FREET4 0.92 01/28/2017   FREET4 0.75 (L) 12/08/2016   FREET4 4.44 (H) 09/07/2016   FREET4 4.58 (H) 08/17/2016   FREET4 1.19 11/20/2014   FREET4 1.22 01/15/2014   FREET4 1.67 (H) 11/23/2013    Graves' antibodies were elevated, but improving: Lab Results   Component Value Date   TSI 205 (H) 09/16/2018   TSI 630 (H) 09/07/2016   Pt denies: - feeling nodules in neck - hoarseness - dysphagia - choking - SOB with lying down  Pt does not have a FH of thyroid ds. No FH of thyroid cancer. No h/o radiation tx to head or neck.  No seaweed or kelp. No recent contrast studies. No herbal supplements. No Biotin use. No recent steroids use.   She also has a history of CVA - when in [place] (~48 y/o) - she was told at that time that her thyroid was abnormal. She also has an aneurysm - previously coiled. She has ATIII deficiency, B12 deficiency.  ROS: Constitutional: no weight gain/no weight loss, + fatigue, no subjective hyperthermia, no subjective hypothermia, + insomnia Eyes: no blurry vision, no xerophthalmia ENT: no sore throat, + see HPI Cardiovascular: no CP/no SOB/no palpitations/no leg swelling Respiratory: no cough/no SOB/no wheezing Gastrointestinal: no N/no V/no D/+ C/no acid reflux Musculoskeletal: no muscle aches/no joint aches Skin: no rashes, no hair loss Neurological: no tremors/no numbness/no tingling/no dizziness  I reviewed pt's medications, allergies, PMH, social hx, family hx, and changes were documented in the history of present illness. Otherwise, unchanged from my initial visit note.  Past Medical History:  Diagnosis Date  . 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  Endometriosis determined by laparoscopy 02/28/2016  . 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 History of cerebral aneurysm repair dx 10-18-2006 via MRI/MRA  5.2mm x 4.10mm saccular   11-16-2006  stent-assisted coiling LICA intracranial superior hypophyseal region  . History of CVA (cerebrovascular accident)    2003  . History of ovarian cyst   . Pelvic pain in female 12/05/2015  . Uterine polyp 12/05/2015   Past Surgical History:  Procedure Laterality Date  . HYSTEROSCOPY W/D&C N/A 02/28/2016   Procedure: DILATATION AND CURETTAGE /DIAGNOSTIC  HYSTEROSCOPY;  Surgeon: Janyth Contes, MD;  Location: [facility];  Service: Gynecology;  Laterality: N/A;  . LAPAROSCOPIC OVARIAN CYSTECTOMY  2001  . LAPAROSCOPY N/A 02/28/2016   Procedure: OPERATIVE LAPAROSCOPY; LYSIS OF ADHESIONS;  Surgeon: Janyth Contes, MD;  Location: [facility];  Service: Gynecology;  Laterality: N/A;  . 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  11-16-2006   dr deveshwar   coiling  (general anesthesia)   Social History   Social History  . Marital status: Married    Spouse name: N/A  . Number of children: 2: 75 and 2 y/o in 2017   Occupational History  . Office Support - Environmental consultant    Social History Main Topics  . Smoking status: Never Smoker  . Smokeless tobacco: Never Used  . Alcohol use No  . Drug use: Tequila, beer - occasionally   Social History Narrative   Immigrated to [place] 1999 from [place]   Current Outpatient Medications on File Prior to Visit  Medication Sig Dispense Refill  . aspirin 325 MG EC tablet Take 325 mg by mouth daily.      . Cholecalciferol (VITAMIN D3) 2000 units TABS Take 1 capsule by mouth daily.    . Cyanocobalamin (VITAMIN B-12) 500 MCG SUBL Place 1 tablet (500 mcg total) under the tongue 1 day or 1 dose. 100 tablet 3  . ibuprofen (ADVIL,MOTRIN) 800 MG tablet Take 1 tablet (800 mg total) by mouth every 8 (eight) hours as needed for moderate pain. 40 tablet 0   No current facility-administered medications on file prior to visit.    No Known Allergies Family History  Problem Relation Age of Onset  . Leukemia Mother   . Cancer Mother 68       leukemia  . Lung cancer Father        SMOKER  . Cancer Father 71       lung ca  . Heart disease Other   . Stroke Other   . Alcohol abuse Other   . Breast cancer Other    PE: BP 118/68   Pulse 78   Temp 98 F (36.7 C)   Ht 5\' 2"  (1.575 m)   Wt 154 lb (69.9 kg)    SpO2 99%   BMI 28.17 kg/m  Wt Readings from Last 3 Encounters:  12/26/18 154 lb (69.9 kg)  09/16/18 150 lb (68 kg)  08/29/18 152 lb (68.9 kg)   Constitutional: Normal weight, in NAD Eyes: PERRLA, EOMI, no exophthalmos ENT: moist mucous membranes, no thyromegaly, no cervical lymphadenopathy Cardiovascular: RRR, No MRG Respiratory: CTA B Gastrointestinal: abdomen soft, NT, ND, BS+ Musculoskeletal: no deformities, strength intact in all 4 Skin: moist, warm, no rashes Neurological: no tremor with outstretched hands, DTR normal in all 4  ASSESSMENT: 1. Graves ds.  PLAN:  1. Patient with a history of Graves' disease, now status post  RAI treatment, which she had 1.5 months ago.  At last visit, she returns after 1 year and 9 months absence, with an undetectable TSH still on 10 mg of methimazole twice a day (dose increase 05/2018).  At that time, I suggested RAI treatment.  We had to check a thyroid uptake before and this was elevated.  The scan was normal, suggesting Graves' disease.  Patient had RAI treatment on 11/04/2018. -She is feeling well after the treatment, without any complaints -At this visit, we will recheck her TFTs to see if she is becoming hypothyroid -We discussed about how to take levothyroxine in case we need to start: Daily, with water, separated by this 30 minutes from breakfast, and separated by this 4 hours from calcium, iron, MVI, PPIs. -I will see her back in 3-4 months but most likely sooner for labs  - time spent with the patient: 15 min, of which >50% was spent in obtaining information about her symptoms, reviewing her previous labs, evaluations, and treatments, counseling her about her condition (please see the discussed topics above), and developing a plan to further investigate and treat it; she had a number of questions which I addressed.  Component     Latest Ref Rng & Units 12/26/2018  TSH     0.35 - 4.50 uIU/mL 15.75 (H)  T4,Free(Direct)     0.60 - 1.60  ng/dL 0.38 (L)  Triiodothyronine,Free,Serum     2.3 - 4.2 pg/mL 2.2 (L)   Patient has developed post ablative hypothyroidism.  This is good news, that the RAI treatment worked in resolving her Graves' disease.  However, she will need to start levothyroxine.  We will start at 75 mcg daily and repeat her tests in 5 to 6 weeks.  Philemon Kingdom, MD PhD [facility]

## 2018-12-26 NOTE — Patient Instructions (Addendum)
Please stop at the lab.  If we need to start Levothyroxine: Take the thyroid hormone every day, with water, at least 30 minutes before breakfast, separated by at least 4 hours from: - acid reflux medications - calcium - iron - multivitamins  Please come back for a follow-up appointment in 3-4 months.

## 2018-12-27 LAB — T3, FREE: T3, Free: 2.2 pg/mL — ABNORMAL LOW (ref 2.3–4.2)

## 2018-12-27 LAB — T4, FREE: Free T4: 0.38 ng/dL — ABNORMAL LOW (ref 0.60–1.60)

## 2018-12-27 LAB — TSH: TSH: 15.75 u[IU]/mL — AB (ref 0.35–4.50)

## 2018-12-27 MED ORDER — LEVOTHYROXINE SODIUM 75 MCG PO TABS: 75.0000 ug | ORAL_TABLET | Freq: Every day | ORAL | 3 refills | Status: DC

## 2018-12-28 ENCOUNTER — Encounter: Payer: Self-pay | Admitting: Internal Medicine

## 2019-02-21 ENCOUNTER — Encounter: Payer: Self-pay | Admitting: Internal Medicine

## 2019-03-03 ENCOUNTER — Other Ambulatory Visit: Payer: Self-pay

## 2019-03-03 ENCOUNTER — Other Ambulatory Visit (INDEPENDENT_AMBULATORY_CARE_PROVIDER_SITE_OTHER): Payer: BC Managed Care – PPO

## 2019-03-03 DIAGNOSIS — E0500 Thyrotoxicosis with diffuse goiter without thyrotoxic crisis or storm: Secondary | ICD-10-CM

## 2019-03-03 LAB — T4, FREE: Free T4: 0.82 ng/dL (ref 0.60–1.60)

## 2019-03-03 LAB — TSH: TSH: 25.18 u[IU]/mL — ABNORMAL HIGH (ref 0.35–4.50)

## 2019-03-07 ENCOUNTER — Encounter: Payer: Self-pay | Admitting: Internal Medicine

## 2019-03-08 ENCOUNTER — Other Ambulatory Visit: Payer: Self-pay | Admitting: Internal Medicine

## 2019-03-08 DIAGNOSIS — E039 Hypothyroidism, unspecified: Secondary | ICD-10-CM

## 2019-03-08 MED ORDER — LEVOTHYROXINE SODIUM 100 MCG PO TABS: 100.0000 ug | ORAL_TABLET | Freq: Every day | ORAL | 3 refills | Status: DC

## 2019-04-19 ENCOUNTER — Other Ambulatory Visit: Payer: BC Managed Care – PPO

## 2019-04-20 ENCOUNTER — Other Ambulatory Visit: Payer: Self-pay

## 2019-04-20 ENCOUNTER — Other Ambulatory Visit (INDEPENDENT_AMBULATORY_CARE_PROVIDER_SITE_OTHER): Payer: BC Managed Care – PPO

## 2019-04-20 DIAGNOSIS — E039 Hypothyroidism, unspecified: Secondary | ICD-10-CM

## 2019-04-21 LAB — T4, FREE: Free T4: 0.8 ng/dL (ref 0.60–1.60)

## 2019-04-21 LAB — TSH: TSH: 8.43 u[IU]/mL — ABNORMAL HIGH (ref 0.35–4.50)

## 2019-04-27 ENCOUNTER — Other Ambulatory Visit: Payer: Self-pay

## 2019-04-27 ENCOUNTER — Encounter: Payer: Self-pay | Admitting: Internal Medicine

## 2019-04-27 ENCOUNTER — Ambulatory Visit (INDEPENDENT_AMBULATORY_CARE_PROVIDER_SITE_OTHER): Payer: BC Managed Care – PPO | Admitting: Internal Medicine

## 2019-04-27 VITALS — BP 118/70 | HR 66 | Ht 62.0 in | Wt 159.0 lb

## 2019-04-27 DIAGNOSIS — E890 Postprocedural hypothyroidism: Secondary | ICD-10-CM

## 2019-04-27 MED ORDER — LEVOTHYROXINE SODIUM 125 MCG PO TABS: 125.0000 ug | ORAL_TABLET | Freq: Every day | ORAL | 3 refills | Status: DC

## 2019-04-27 NOTE — Patient Instructions (Signed)
Please increase the levothyroxine dose to 125 mcg.  Take the thyroid hormone every day, with water, at least 30 minutes before breakfast, separated by at least 4 hours from: - acid reflux medications - calcium - iron - multivitamins  Please come back for labs in 5 to 6 weeks.  Please come back for a follow-up appointment in 6 months.

## 2019-04-27 NOTE — Progress Notes (Signed)
Patient ID: Linda Stewart, female   DOB: [birth year], 48 y.o.   MRN: [id number]    HPI  Linda Stewart is a 48 y.o.-year-old female, returning for f/u for history of Graves ds, now with post ablative hypothyroidism. Last visit 4 months ago.  Reviewed and addended history:  I saw the pt first in 2015, when she presented with thyrotoxicosis, but this was improving and I advised her to return for labs in 2 months, but did not intervene at that time. Pt. Was lost for f/u.   She then presented to PCP in 2017 >> still thyrotoxic >> advised to return to see me.  She was started on Propranolol 10 mg 3x a day by PCP.  We were able to decrease this gradually and then stop  In 05/2018, we increased her methimazole from 5 mg twice a day to 10 mg twice a day.  Subsequent labs were improved.    In 09/2018, I suggested RAI treatment after she returned after long absence with very abnormal TFTs.  Thyroid uptake and scan (10/14/2018): Was consistent with Graves' disease: Elevated 4 hour (54.1%) and 24 hour (66.6) RAI uptake consistent with hyperthyroidism.  Normal thyroid scan   RAI treatment (11/04/2018). She felt well after the treatment, without symptoms other than mild fatigue.  She developed post ablative hypothyroidism and was started on levothyroxine in 12/2018.  She is on levothyroxine 100 mcg daily (dose increased 02/2019).  - in am - fasting - at least 30 min from b'fast - no Ca, Fe, MVI, PPIs - not on Biotin  Reviewed patient's TFTs: Lab Results  Component Value Date   TSH 8.43 (H) 04/20/2019   TSH 25.18 (H) 03/03/2019   TSH 15.75 (H) 12/26/2018   TSH <0.01 (L) 09/16/2018   TSH <0.01 (L) 08/29/2018   TSH <0.01 (L) 06/03/2017   TSH 0.37 01/28/2017   TSH 4.110 12/08/2016   TSH 0.01 (L) 09/07/2016   TSH 0.00 (L) 08/17/2016   FREET4 0.80 04/20/2019   FREET4 0.82 03/03/2019   FREET4 0.38 (L) 12/26/2018   FREET4 1.42 09/16/2018   FREET4 1.40 08/29/2018   FREET4 1.70 (H)  06/03/2017   FREET4 0.92 01/28/2017   FREET4 0.75 (L) 12/08/2016   FREET4 4.44 (H) 09/07/2016   FREET4 4.58 (H) 08/17/2016    Her Graves' antibodies were elevated: Lab Results  Component Value Date   TSI 205 (H) 09/16/2018   TSI 630 (H) 09/07/2016   Pt denies: - feeling nodules in neck - hoarseness - dysphagia - choking - SOB with lying down  Pt does not have a FH of thyroid ds. No FH of thyroid cancer. No h/o radiation tx to head or neck except for RAI treatment.  No seaweed or kelp. No recent contrast studies. No herbal supplements. No Biotin use. No recent steroids use.   She also has a history of CVA - when in [place] (~48 y/o) - she was told at that time that her thyroid was abnormal. She also has an aneurysm - previously coiled. She has ATIII deficiency, B12 deficiency.  ROS: Constitutional: + weight gain/no weight loss, no fatigue, no subjective hyperthermia, no subjective hypothermia Eyes: no blurry vision, no xerophthalmia ENT: no sore throat, + see HPI Cardiovascular: no CP/no SOB/no palpitations/no leg swelling Respiratory: no cough/no SOB/no wheezing Gastrointestinal: no N/no V/no D/no C/no acid reflux Musculoskeletal: no muscle aches/no joint aches Skin: no rashes, no hair loss Neurological: no tremors/no numbness/no tingling/no dizziness  I reviewed pt's medications, allergies,  PMH, social hx, family hx, and changes were documented in the history of present illness. Otherwise, unchanged from my initial visit note.  Past Medical History:  Diagnosis Date  . 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 Endometriosis determined by laparoscopy 02/28/2016  . 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 History of cerebral aneurysm repair dx 10-18-2006 via MRI/MRA  5.8mm x 4.42mm saccular   11-16-2006  stent-assisted coiling LICA intracranial superior hypophyseal region  . History of CVA (cerebrovascular accident)    2003  . History of ovarian cyst   . Pelvic  pain in female 12/05/2015  . Uterine polyp 12/05/2015   Past Surgical History:  Procedure Laterality Date  . HYSTEROSCOPY W/D&C N/A 02/28/2016   Procedure: DILATATION AND CURETTAGE /DIAGNOSTIC HYSTEROSCOPY;  Surgeon: Linda Contes, MD;  Location: [facility];  Service: Gynecology;  Laterality: N/A;  . LAPAROSCOPIC OVARIAN CYSTECTOMY  2001  . LAPAROSCOPY N/A 02/28/2016   Procedure: OPERATIVE LAPAROSCOPY; LYSIS OF ADHESIONS;  Surgeon: Linda Contes, MD;  Location: [facility];  Service: Gynecology;  Laterality: N/A;  . 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  11-16-2006   dr deveshwar   coiling  (general anesthesia)   Social History   Social History  . Marital status: Married    Spouse name: N/A  . 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 Smoking status: Never Smoker  . Smokeless tobacco: Never Used  . Alcohol use No  . Drug use: Tequila, beer - occasionally   Social History Narrative   Immigrated to [place] 1999 from [place]   Current Outpatient Medications on File Prior to Visit  Medication Sig Dispense Refill  . aspirin 325 MG EC tablet Take 325 mg by mouth daily.      . Cholecalciferol (VITAMIN D3) 2000 units TABS Take 1 capsule by mouth daily.    . Cyanocobalamin (VITAMIN B-12) 500 MCG SUBL Place 1 tablet (500 mcg total) under the tongue 1 day or 1 dose. 100 tablet 3  . ibuprofen (ADVIL,MOTRIN) 800 MG tablet Take 1 tablet (800 mg total) by mouth every 8 (eight) hours as needed for moderate pain. 40 tablet 0  . levothyroxine (SYNTHROID) 100 MCG tablet Take 1 tablet (100 mcg total) by mouth daily. 45 tablet 3   No current facility-administered medications on file prior to visit.    No Known Allergies Family History  Problem Relation Age of Onset  . Leukemia Mother   .  Cancer Mother 44       leukemia  . Lung cancer Father        SMOKER  . Cancer Father 45       lung ca  . Heart disease Other   . Stroke Other   . Alcohol abuse Other   . Breast cancer Other    PE: BP 118/70   Pulse 66   Ht 5\' 2"  (1.575 m)   Wt 159 lb (72.1 kg)   SpO2 98%   BMI 29.08 kg/m  Wt Readings from Last 3 Encounters:  04/27/19 159 lb (72.1 kg)  12/26/18 154 lb (69.9 kg)  09/16/18 150 lb (68 kg)   Constitutional: Slightly overweight, in NAD Eyes: PERRLA, EOMI, no exophthalmos ENT: moist mucous membranes, no thyromegaly, no cervical lymphadenopathy Cardiovascular: RRR, No MRG Respiratory: CTA  B Gastrointestinal: abdomen soft, NT, ND, BS+ Musculoskeletal: no deformities, strength intact in all 4 Skin: moist, warm, no rashes Neurological: no tremor with outstretched hands, DTR normal in all 4  ASSESSMENT: 1. Graves ds.  PLAN:  1. Patient with a history of Graves' disease, now status post RAI treatment in zero 1/20, after which he rapidly developed hypothyroidism.  We started levothyroxine 75 mcg daily. - latest thyroid labs reviewed with pt >>  TSH was improved, but still above target few days ago - she continues on LT4 100 mcg daily >> we will need to increase her dose to 125 mcg daily.   - she does complain of weight gain - we discussed about taking the thyroid hormone every day, with water, >30 minutes before breakfast, separated by >4 hours from acid reflux medications, calcium, iron, multivitamins. Pt. is taking it correctly. -I will see her back in 6 months, but we will have her back for labs in 1.5 months after the above change in dose.  - time spent with the patient: 15 min, of which >50% was spent in obtaining information about her symptoms, reviewing her previous labs, evaluations, and treatments, counseling her about her condition (please see the discussed topics above), and developing a plan to further investigate and treat it; she had a number of questions  which I addressed.  Philemon Kingdom, MD PhD [facility]

## 2019-06-14 ENCOUNTER — Other Ambulatory Visit: Payer: BC Managed Care – PPO

## 2019-07-06 ENCOUNTER — Other Ambulatory Visit (INDEPENDENT_AMBULATORY_CARE_PROVIDER_SITE_OTHER): Payer: BC Managed Care – PPO

## 2019-07-06 ENCOUNTER — Other Ambulatory Visit: Payer: Self-pay

## 2019-07-06 DIAGNOSIS — E890 Postprocedural hypothyroidism: Secondary | ICD-10-CM | POA: Diagnosis not present

## 2019-07-07 LAB — TSH: TSH: 2.38 u[IU]/mL (ref 0.35–4.50)

## 2019-07-07 LAB — T4, FREE: Free T4: 1.19 ng/dL (ref 0.60–1.60)

## 2019-08-02 ENCOUNTER — Other Ambulatory Visit: Payer: Self-pay

## 2019-08-02 DIAGNOSIS — Z0000 Encounter for general adult medical examination without abnormal findings: Secondary | ICD-10-CM

## 2019-09-06 ENCOUNTER — Other Ambulatory Visit: Payer: Self-pay

## 2019-09-06 ENCOUNTER — Ambulatory Visit (INDEPENDENT_AMBULATORY_CARE_PROVIDER_SITE_OTHER): Payer: BC Managed Care – PPO | Admitting: Internal Medicine

## 2019-09-06 ENCOUNTER — Encounter: Payer: Self-pay | Admitting: Internal Medicine

## 2019-09-06 VITALS — BP 116/74 | HR 69 | Temp 98.0°F | Ht 62.0 in | Wt 162.0 lb

## 2019-09-06 DIAGNOSIS — R109 Unspecified abdominal pain: Secondary | ICD-10-CM | POA: Insufficient documentation

## 2019-09-06 DIAGNOSIS — Z0000 Encounter for general adult medical examination without abnormal findings: Secondary | ICD-10-CM

## 2019-09-06 DIAGNOSIS — Z23 Encounter for immunization: Secondary | ICD-10-CM

## 2019-09-06 DIAGNOSIS — I671 Cerebral aneurysm, nonruptured: Secondary | ICD-10-CM

## 2019-09-06 DIAGNOSIS — E0500 Thyrotoxicosis with diffuse goiter without thyrotoxic crisis or storm: Secondary | ICD-10-CM

## 2019-09-06 DIAGNOSIS — R1030 Lower abdominal pain, unspecified: Secondary | ICD-10-CM | POA: Diagnosis not present

## 2019-09-06 DIAGNOSIS — R635 Abnormal weight gain: Secondary | ICD-10-CM

## 2019-09-06 DIAGNOSIS — E538 Deficiency of other specified B group vitamins: Secondary | ICD-10-CM

## 2019-09-06 DIAGNOSIS — E559 Vitamin D deficiency, unspecified: Secondary | ICD-10-CM

## 2019-09-06 NOTE — Patient Instructions (Signed)

## 2019-09-06 NOTE — Assessment & Plan Note (Signed)
On B12 

## 2019-09-06 NOTE — Progress Notes (Signed)
Subjective:  Patient ID: Linda Stewart, female    DOB: November 25, 1970  Age: 48 y.o. MRN: [id number]  CC: No chief complaint on file.   HPI Linda Stewart presents for a well exam C/o B LBP and B abd pain x 1 day - stayed home x 2 days. May have had urinary sx's at the time C/o wt gain  Outpatient Medications Prior to Visit  Medication Sig Dispense Refill  . aspirin 325 MG EC tablet Take 325 mg by mouth daily.      . Cholecalciferol (VITAMIN D3) 2000 units TABS Take 1 capsule by mouth daily.    . Cyanocobalamin (VITAMIN B-12) 500 MCG SUBL Place 1 tablet (500 mcg total) under the tongue 1 day or 1 dose. 100 tablet 3  . ibuprofen (ADVIL,MOTRIN) 800 MG tablet Take 1 tablet (800 mg total) by mouth every 8 (eight) hours as needed for moderate pain. 40 tablet 0  . levothyroxine (SYNTHROID) 125 MCG tablet Take 1 tablet (125 mcg total) by mouth daily. 45 tablet 3   No facility-administered medications prior to visit.     ROS: Review of Systems  Constitutional: Negative for activity change, appetite change, chills, fatigue and unexpected weight change.  HENT: Negative for congestion, mouth sores and sinus pressure.   Eyes: Negative for visual disturbance.  Respiratory: Negative for cough and chest tightness.   Gastrointestinal: Negative for abdominal pain and nausea.  Genitourinary: Negative for difficulty urinating, frequency and vaginal pain.  Musculoskeletal: Positive for arthralgias. Negative for back pain and gait problem.  Skin: Negative for pallor and rash.  Neurological: Negative for dizziness, tremors, weakness, numbness and headaches.  Psychiatric/Behavioral: Negative for confusion, sleep disturbance and suicidal ideas.    Objective:  BP 116/74 (BP Location: Left Arm, Patient Position: Sitting, Cuff Size: Normal)   Pulse 69   Temp 98 F (36.7 C) (Oral)   Ht 5\' 2"  (1.575 m)   Wt 162 lb (73.5 kg)   SpO2 98%   BMI 29.63 kg/m   BP Readings from Last 3 Encounters:   09/06/19 116/74  04/27/19 118/70  12/26/18 118/68    Wt Readings from Last 3 Encounters:  09/06/19 162 lb (73.5 kg)  04/27/19 159 lb (72.1 kg)  12/26/18 154 lb (69.9 kg)    Physical Exam Constitutional:      General: She is not in acute distress.    Appearance: She is well-developed.  HENT:     Head: Normocephalic.     Right Ear: External ear normal.     Left Ear: External ear normal.     Nose: Nose normal.  Eyes:     General:        Right eye: No discharge.        Left eye: No discharge.     Conjunctiva/sclera: Conjunctivae normal.     Pupils: Pupils are equal, round, and reactive to light.  Neck:     Musculoskeletal: Normal range of motion and neck supple.     Thyroid: No thyromegaly.     Vascular: No JVD.     Trachea: No tracheal deviation.  Cardiovascular:     Rate and Rhythm: Normal rate and regular rhythm.     Heart sounds: Normal heart sounds.  Pulmonary:     Effort: No respiratory distress.     Breath sounds: No stridor. No wheezing.  Abdominal:     General: Bowel sounds are normal. There is no distension.     Palpations: Abdomen is soft. There  is no mass.     Tenderness: There is no abdominal tenderness. There is no guarding or rebound.  Musculoskeletal:        General: No tenderness.  Lymphadenopathy:     Cervical: No cervical adenopathy.  Skin:    Findings: No erythema or rash.  Neurological:     Mental Status: She is oriented to person, place, and time.     Cranial Nerves: No cranial nerve deficit.     Motor: No abnormal muscle tone.     Coordination: Coordination normal.     Deep Tendon Reflexes: Reflexes normal.  Psychiatric:        Behavior: Behavior normal.        Thought Content: Thought content normal.        Judgment: Judgment normal.     Lab Results  Component Value Date   WBC 5.1 08/29/2018   HGB 12.2 08/29/2018   HCT 37.2 08/29/2018   PLT 281.0 08/29/2018   GLUCOSE 101 (H) 08/29/2018   CHOL 179 08/29/2018   TRIG 95.0  08/29/2018   HDL 55.60 08/29/2018   LDLCALC 105 (H) 08/29/2018   ALT 11 08/29/2018   AST 13 08/29/2018   NA 139 08/29/2018   K 3.9 08/29/2018   CL 102 08/29/2018   CREATININE 0.81 08/29/2018   BUN 18 08/29/2018   CO2 30 08/29/2018   TSH 2.38 07/06/2019   INR 0.99 09/22/2012    Nm Rai Therapy For Hyperthyroidism  Result Date: 11/04/2018 CLINICAL DATA:  Hyperthyroidism 48 year old female with hyperthyroidism. TSH less than 0.01. Trial of methimazole. Symptoms weight loss, diarrhea, hand tremors, hair loss, heart palpitations. EXAM: RADIOACTIVE IODINE THERAPY FOR HYPERTHYROIDISM COMPARISON:  The thyroid uptake and scan 10/14/2018 TECHNIQUE: Radioactive iodine prescribed by Dr. Leonia Reeves. The risks and benefits of radioactive iodine therapy were discussed with the patient in detail by Dr. Maryland Pink. Alternative therapies were also mentioned. Radiation safety was discussed with the patient, including how to protect the general public from exposure. There were no barriers to communication. Written consent was obtained. The patient then received a capsule containing the radiopharmaceutical. The patient will follow-up with the referring physician. RADIOPHARMACEUTICALS:  11.9 mCi I-131 sodium iodide orally IMPRESSION: Per oral administration of I-131 sodium iodide for the treatment of hyperthyroidism. Electronically Signed   By: Suzy Bouchard M.D.   On: 11/04/2018 12:56    Assessment & Plan:   There are no diagnoses linked to this encounter.   No orders of the defined types were placed in this encounter.    Follow-up: No follow-ups on file.  Walker Kehr, MD

## 2019-09-06 NOTE — Assessment & Plan Note (Signed)
abd and LBP ?etiology - ruptured cyst vs UTI vs other CT or [place] if relapsed UA CBC

## 2019-09-06 NOTE — Assessment & Plan Note (Signed)
Diet/fasting discussed

## 2019-09-06 NOTE — Assessment & Plan Note (Signed)
We discussed age appropriate health related issues, including available/recomended screening tests and vaccinations. We discussed a need for adhering to healthy diet and exercise. Labs were ordered to be later reviewed . All questions were answered.   

## 2019-09-06 NOTE — Assessment & Plan Note (Signed)
F/u w/Dr Estanislado Pandy, Dr Dohmeier

## 2019-09-07 ENCOUNTER — Other Ambulatory Visit: Payer: BC Managed Care – PPO

## 2019-09-07 ENCOUNTER — Other Ambulatory Visit (INDEPENDENT_AMBULATORY_CARE_PROVIDER_SITE_OTHER): Payer: BC Managed Care – PPO

## 2019-09-07 DIAGNOSIS — E538 Deficiency of other specified B group vitamins: Secondary | ICD-10-CM | POA: Diagnosis not present

## 2019-09-07 DIAGNOSIS — Z0000 Encounter for general adult medical examination without abnormal findings: Secondary | ICD-10-CM | POA: Diagnosis not present

## 2019-09-07 DIAGNOSIS — E559 Vitamin D deficiency, unspecified: Secondary | ICD-10-CM | POA: Diagnosis not present

## 2019-09-07 DIAGNOSIS — E0500 Thyrotoxicosis with diffuse goiter without thyrotoxic crisis or storm: Secondary | ICD-10-CM | POA: Diagnosis not present

## 2019-09-07 LAB — URINALYSIS, ROUTINE W REFLEX MICROSCOPIC
Bilirubin Urine: NEGATIVE
Ketones, ur: NEGATIVE
Leukocytes,Ua: NEGATIVE
Nitrite: NEGATIVE
Specific Gravity, Urine: 1.015 (ref 1.000–1.030)
Total Protein, Urine: NEGATIVE
Urine Glucose: NEGATIVE
Urobilinogen, UA: 0.2 (ref 0.0–1.0)
WBC, UA: NONE SEEN (ref 0–?)
pH: 7 (ref 5.0–8.0)

## 2019-09-07 LAB — CBC WITH DIFFERENTIAL/PLATELET
Basophils Absolute: 0 10*3/uL (ref 0.0–0.1)
Basophils Relative: 1 % (ref 0.0–3.0)
Eosinophils Absolute: 0.2 10*3/uL (ref 0.0–0.7)
Eosinophils Relative: 3.1 % (ref 0.0–5.0)
HCT: 36.3 % (ref 36.0–46.0)
Hemoglobin: 11.7 g/dL — ABNORMAL LOW (ref 12.0–15.0)
Lymphocytes Relative: 28.2 % (ref 12.0–46.0)
Lymphs Abs: 1.4 10*3/uL (ref 0.7–4.0)
MCHC: 32.4 g/dL (ref 30.0–36.0)
MCV: 79.3 fl (ref 78.0–100.0)
Monocytes Absolute: 0.4 10*3/uL (ref 0.1–1.0)
Monocytes Relative: 8.7 % (ref 3.0–12.0)
Neutro Abs: 2.9 10*3/uL (ref 1.4–7.7)
Neutrophils Relative %: 59 % (ref 43.0–77.0)
Platelets: 285 10*3/uL (ref 150.0–400.0)
RBC: 4.57 Mil/uL (ref 3.87–5.11)
RDW: 14.5 % (ref 11.5–15.5)
WBC: 4.9 10*3/uL (ref 4.0–10.5)

## 2019-09-07 LAB — BASIC METABOLIC PANEL
BUN: 15 mg/dL (ref 6–23)
CO2: 28 mEq/L (ref 19–32)
Calcium: 9.2 mg/dL (ref 8.4–10.5)
Chloride: 103 mEq/L (ref 96–112)
Creatinine, Ser: 0.71 mg/dL (ref 0.40–1.20)
GFR: 88.21 mL/min (ref >60.00)
Glucose, Bld: 92 mg/dL (ref 70–99)
Potassium: 4 mEq/L (ref 3.5–5.1)
Sodium: 139 mEq/L (ref 135–145)

## 2019-09-07 LAB — HEPATIC FUNCTION PANEL
ALT: 13 U/L (ref 0–35)
AST: 13 U/L (ref 0–37)
Albumin: 4.3 g/dL (ref 3.5–5.2)
Alkaline Phosphatase: 49 U/L (ref 39–117)
Bilirubin, Direct: 0.1 mg/dL (ref 0.0–0.3)
Total Bilirubin: 0.3 mg/dL (ref 0.2–1.2)
Total Protein: 7.4 g/dL (ref 6.0–8.3)

## 2019-09-07 LAB — T4, FREE: Free T4: 1.12 ng/dL (ref 0.60–1.60)

## 2019-09-07 LAB — LIPID PANEL
Cholesterol: 204 mg/dL — ABNORMAL HIGH (ref 0–200)
HDL: 58.9 mg/dL (ref >39.00)
LDL Cholesterol: 122 mg/dL — ABNORMAL HIGH (ref 0–99)
NonHDL: 145.53
Total CHOL/HDL Ratio: 3
Triglycerides: 116 mg/dL (ref 0.0–149.0)
VLDL: 23.2 mg/dL (ref 0.0–40.0)

## 2019-09-07 LAB — TSH: TSH: 2.35 u[IU]/mL (ref 0.35–4.50)

## 2019-09-07 LAB — VITAMIN D 25 HYDROXY (VIT D DEFICIENCY, FRACTURES): VITD: 21.86 ng/mL — ABNORMAL LOW (ref 30.00–100.00)

## 2019-09-07 LAB — VITAMIN B12: Vitamin B-12: 1228 pg/mL — ABNORMAL HIGH (ref 211–911)

## 2019-09-08 ENCOUNTER — Other Ambulatory Visit: Payer: Self-pay | Admitting: Internal Medicine

## 2019-09-08 MED ORDER — VITAMIN D3 1.25 MG (50000 UT) PO CAPS: 1.0000 | ORAL_CAPSULE | ORAL | 0 refills | Status: DC

## 2019-09-12 ENCOUNTER — Telehealth: Payer: Self-pay

## 2019-09-12 NOTE — Telephone Encounter (Signed)
I am thinking they plan to change the manufacturer... If so, this is okay

## 2019-09-12 NOTE — Telephone Encounter (Signed)
Pharmacy called in needing approval to change the medication levothyroxine (SYNTHROID) 125 MCG tablet

## 2019-09-13 ENCOUNTER — Encounter: Payer: Self-pay | Admitting: Internal Medicine

## 2019-09-13 NOTE — Telephone Encounter (Signed)
Called pharmacy, they are currently closed.  Will call later.

## 2019-09-14 NOTE — Telephone Encounter (Signed)
Pharmacy notified and patient notified

## 2019-09-18 ENCOUNTER — Encounter: Payer: Self-pay | Admitting: Family Medicine

## 2019-09-18 ENCOUNTER — Ambulatory Visit: Payer: Self-pay

## 2019-09-18 ENCOUNTER — Ambulatory Visit (INDEPENDENT_AMBULATORY_CARE_PROVIDER_SITE_OTHER): Payer: BC Managed Care – PPO | Admitting: Family Medicine

## 2019-09-18 VITALS — BP 122/82 | HR 72 | Ht 62.0 in | Wt 162.0 lb

## 2019-09-18 DIAGNOSIS — M25562 Pain in left knee: Secondary | ICD-10-CM | POA: Diagnosis not present

## 2019-09-18 DIAGNOSIS — G8929 Other chronic pain: Secondary | ICD-10-CM

## 2019-09-18 DIAGNOSIS — M25561 Pain in right knee: Secondary | ICD-10-CM | POA: Insufficient documentation

## 2019-09-18 NOTE — Progress Notes (Signed)
[facility] [place], [place] [address] Phone: [phone] Subjective:   [name], am serving as a scribe for Dr. Hulan Saas. This visit occurred during the SARS-CoV-2 public health emergency.  Safety protocols were in place, including screening questions prior to the visit, additional usage of staff PPE, and extensive cleaning of exam room while observing appropriate contact time as indicated for disinfecting solutions.   I'm seeing this patient by the request  of:  Linda, [name], MD   CC: Left knee pain  [id number]  Linda Stewart is a 48 y.o. female coming in with complaint of left knee pain for the past 4 weeks. May have injured knee while kicking soccer ball with son. Pain over the patella tendon. Today she is not having pain but feels that her knee is "loose" with walking and can sometimes catch. Has been using ace wrap for support. At night she has pain in posterior aspect of knee.     Past Medical History:  Diagnosis Date  . 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 Endometriosis determined by laparoscopy 02/28/2016  . 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 History of cerebral aneurysm repair dx 10-18-2006 via MRI/MRA  5.44mm x 4.40mm saccular   11-16-2006  stent-assisted coiling LICA intracranial superior hypophyseal region  . History of CVA (cerebrovascular accident)    2003  . History of ovarian cyst   . Pelvic pain in female 12/05/2015  . Uterine polyp 12/05/2015   Past Surgical History:  Procedure Laterality Date  . HYSTEROSCOPY W/D&C N/A 02/28/2016   Procedure: DILATATION AND CURETTAGE /DIAGNOSTIC HYSTEROSCOPY;  Surgeon: Janyth Contes, MD;  Location: [facility];  Service: Gynecology;  Laterality: N/A;  . LAPAROSCOPIC OVARIAN CYSTECTOMY  2001  . LAPAROSCOPY N/A 02/28/2016   Procedure: OPERATIVE LAPAROSCOPY; LYSIS OF ADHESIONS;  Surgeon: Janyth Contes, MD;   Location: [facility];  Service: Gynecology;  Laterality: N/A;  . 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 Marital status: Married    Spouse name: Not on file  . Number of children: 2  . Years of education: Not on file  . Highest education level: Not on file  Occupational History  . 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  Tobacco Use  . Smoking status: Never Smoker  . Smokeless tobacco: Never Used  Substance and Sexual Activity  . Alcohol use: No  . Drug use: No  . Sexual activity: Yes  Other Topics Concern  . Not on file  Social History Narrative   Immigrated to [place] 1999 from [place]         Social Determinants of Health   Financial Resource Strain:   . Difficulty of Paying Living Expenses: Not on file  Food Insecurity:   . Worried About Charity fundraiser in the Last Year: Not on file  . Ran Out of Food in the Last Year: Not on file  Transportation Needs:   . Lack of Transportation (Medical): Not on file  . Lack of Transportation (Non-Medical): Not on file  Physical Activity:   . Days of Exercise per Week: Not on file  . Minutes of Exercise per Session: Not on file  Stress:   . Feeling of Stress : Not on  file  Social Connections:   . Frequency of Communication with Friends and Family: Not on file  . Frequency of Social Gatherings with Friends and Family: Not on file  . Attends Religious Services: Not on file  . Active Member of Clubs or Organizations: Not on file  . Attends Archivist Meetings: Not on file  . Marital Status: Not on file   No Known Allergies Family History  Problem Relation Age of Onset  . Leukemia Mother   . Cancer Mother 3       leukemia  . Lung cancer Father        SMOKER  . Cancer Father 35       lung ca  . Heart  disease Other   . Stroke Other   . Alcohol abuse Other   . Breast cancer Other     Current Outpatient Medications (Endocrine & Metabolic):  .  levothyroxine (SYNTHROID) 125 MCG tablet, Take 1 tablet (125 mcg total) by mouth daily.    Current Outpatient Medications (Analgesics):  .  aspirin 325 MG EC tablet, Take 325 mg by mouth daily.   [name]  ibuprofen (ADVIL,MOTRIN) 800 MG tablet, Take 1 tablet (800 mg total) by mouth every 8 (eight) hours as needed for moderate pain.  Current Outpatient Medications (Hematological):  [name]  Cyanocobalamin (VITAMIN B-12) 500 MCG SUBL, Place 1 tablet (500 mcg total) under the tongue 1 day or 1 dose.  Current Outpatient Medications (Other):  [name]  Cholecalciferol (VITAMIN D3) 1.25 MG (50000 UT) CAPS, Take 1 capsule by mouth once a week. .  Cholecalciferol (VITAMIN D3) 2000 units TABS, Take 1 capsule by mouth daily.    Past medical history, social, surgical and family history all reviewed in electronic medical record.  No pertanent information unless stated regarding to the chief complaint.   Review of Systems:  No headache, visual changes, nausea, vomiting, diarrhea, constipation, dizziness, abdominal pain, skin rash, fevers, chills, night sweats, weight loss, swollen lymph nodes, body aches, joint swelling, , chest pain, shortness of breath, mood changes.  Positive muscle aches  Objective  Blood pressure 122/82, pulse 72, height 5\' 2"  (1.575 m), weight 162 lb (73.5 kg), SpO2 99 %.    General: No apparent distress alert and oriented x3 mood and affect normal, dressed appropriately.  HEENT: Pupils equal, extraocular movements intact  Respiratory: Patient's speak in full sentences and does not appear short of breath  Cardiovascular: No lower extremity edema, non tender, no erythema  Skin: Warm dry intact with no signs of infection or rash on extremities or on axial skeleton.  Abdomen: Soft nontender  Neuro: Cranial nerves II through XII are intact,  neurovascularly intact in all extremities with 2+ DTRs and 2+ pulses.  Lymph: No lymphadenopathy of posterior or anterior cervical chain or axillae bilaterally.  Gait normal with good balance and coordination.  MSK:  tender with full range of motion and good stability and symmetric strength and tone of shoulders, elbows, wrist, hip, and ankles bilaterally.  Left knee exam shows the patient does have near full range of motion with extension but does have some lateral tracking of the patella noted.  Patient does have limited with flexion lacking the last 10 degrees secondary to discomfort and pain that patient states is more on the posterior medial aspect.  Negative squeeze test of the calf.  Limited musculoskeletal ultrasound was performed and interpreted by Lyndal Pulley  Limited ultrasound of patient's knee shows that patient does have some mild  narrowing of the patellofemoral joint.  Patient does have a trace effusion noted with synovitis of the knee noted in the patellofemoral joint.  Patient's medial meniscus has some mild hypoechoic changes but no true acute tear is appreciated.  No Baker's cyst appreciated in the posterior aspect.  Hamstring tendon appears to be unremarkable.    Impression and Recommendations:     This case required medical decision making of moderate complexity. The above documentation has been reviewed and is accurate and complete Lyndal Pulley, DO       Note: This dictation was prepared with Dragon dictation along with smaller phrase technology. Any transcriptional errors that result from this process are unintentional.

## 2019-09-18 NOTE — Assessment & Plan Note (Signed)
Patient's left knee pain seems to have some more patellofemoral lateral tracking noted.  Small effusion of the patellofemoral joint noted.  Meniscus did not appear to be unremarkable at this moment the patient did have some mild signs and symptoms that is consistent with a meniscal injury.  We discussed which activities to potentially avoid, trial of topical anti-inflammatories, icing regimen, follow-up in 4 to 6 weeks.  If no improvement will consider injection and formal physical therapy.

## 2019-09-18 NOTE — Patient Instructions (Signed)
Good to see you.  Ice 20 minutes 2 times daily. Usually after activity and before bed. Exercises 3 times a week.  pennsaid pinkie amount topically 2 times daily as needed.  Brace with activity See me again in 5 weeks

## 2019-10-16 ENCOUNTER — Other Ambulatory Visit: Payer: Self-pay

## 2019-10-16 ENCOUNTER — Ambulatory Visit (INDEPENDENT_AMBULATORY_CARE_PROVIDER_SITE_OTHER): Payer: BC Managed Care – PPO

## 2019-10-16 ENCOUNTER — Encounter: Payer: Self-pay | Admitting: Family Medicine

## 2019-10-16 ENCOUNTER — Ambulatory Visit: Payer: BC Managed Care – PPO | Admitting: Family Medicine

## 2019-10-16 VITALS — BP 126/82 | HR 82 | Ht 62.0 in | Wt 160.0 lb

## 2019-10-16 DIAGNOSIS — M25562 Pain in left knee: Secondary | ICD-10-CM

## 2019-10-16 DIAGNOSIS — G8929 Other chronic pain: Secondary | ICD-10-CM

## 2019-10-16 DIAGNOSIS — S83242A Other tear of medial meniscus, current injury, left knee, initial encounter: Secondary | ICD-10-CM | POA: Diagnosis not present

## 2019-10-16 MED ORDER — LEVOTHYROXINE SODIUM 125 MCG PO TABS: 125.0000 ug | ORAL_TABLET | Freq: Every day | ORAL | 3 refills | Status: DC

## 2019-10-16 NOTE — Patient Instructions (Signed)
Wear brace less Xray today See me again 4-6 weeks

## 2019-10-16 NOTE — Assessment & Plan Note (Signed)
Now that patient is effusion has improved patient does have what appears to be a medial meniscal tear.  I discussed with patient in great length about icing regimen, home exercise, which activities to do which wants to avoid.  Patient is to increase activity slowly over the course the next several days.  We discussed wearing the brace less at the moment.  As long as patient does relatively well we will continue with conservative therapy.  Worsening pain consider injections, formal physical therapy, or possible imaging.

## 2019-10-16 NOTE — Progress Notes (Signed)
[place] Phone: [phone] Subjective:   Linda Stewart, am serving as a scribe for Linda Stewart. This visit occurred during the SARS-CoV-2 public health emergency.  Safety protocols were in place, including screening questions prior to the visit, additional usage of staff PPE, and extensive cleaning of exam room while observing appropriate contact time as indicated for disinfecting solutions.   I'm seeing this patient by the request  of:    CC: Left knee pain follow-up  [id number]   09/18/2019 Editor: Lyndal Pulley, DO (Physician)    Patient's left knee pain seems to have some more patellofemoral lateral tracking noted.  Small effusion of the patellofemoral joint noted.  Meniscus did not appear to be unremarkable at this moment the patient did have some mild signs and symptoms that is consistent with a meniscal injury.  We discussed which activities to potentially avoid, trial of topical anti-inflammatories, icing regimen, follow-up in 4 to 6 weeks.  If Stewart improvement will consider injection and formal physical therapy.       Update 10/16/2019 ADRINNA Stewart is a 49 y.o. female coming in with complaint of left knee pain. Patient states that her pain is intermittent. Unable to completely flex knee. Pain can sometimes radiate up into her left glute. Pain has improved since first visit. Has been wearing brace when she is being more active. Pain has been both anterior and posterior since last visit.      Past Medical History:  Diagnosis Date  . 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 Endometriosis determined by laparoscopy 02/28/2016  . Headache(784.0)    Chiari Malformation - Stewart 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  . History of cerebral aneurysm repair dx 10-18-2006 via MRI/MRA  5.58mm x 4.35mm saccular   11-16-2006  stent-assisted coiling LICA intracranial superior hypophyseal region  . History of CVA (cerebrovascular  accident)    2003  . History of ovarian cyst   . Pelvic pain in female 12/05/2015  . Uterine polyp 12/05/2015   Past Surgical History:  Procedure Laterality Date  . HYSTEROSCOPY WITH D & C N/A 02/28/2016   Procedure: DILATATION AND CURETTAGE /DIAGNOSTIC HYSTEROSCOPY;  Surgeon: Linda Contes, MD;  Location: [facility];  Service: Gynecology;  Laterality: N/A;  . LAPAROSCOPIC OVARIAN CYSTECTOMY  2001  . LAPAROSCOPY N/A 02/28/2016   Procedure: OPERATIVE LAPAROSCOPY; LYSIS OF ADHESIONS;  Surgeon: Linda Contes, MD;  Location: [facility];  Service: Gynecology;  Laterality: N/A;  . 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 Marital status: Married    Spouse name: Not on file  . Number of children: 2  . Years of education: Not on file  . Highest education level: Not on file  Occupational History  . 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  Tobacco Use  . Smoking status: Never Smoker  . Smokeless tobacco: Never Used  Substance and Sexual Activity  . Alcohol use: Stewart  . Drug use: Stewart  . Sexual activity: Yes  Other Topics Concern  . Not on file  Social History Narrative   Immigrated to [place] 1999 from [place]         Social Determinants of Health   Financial Resource Strain:   . Difficulty of Paying Living Expenses: Not on file  Food Insecurity:   . Worried About Charity fundraiser in the Last Year: Not on file  . Ran Out of Food in the Last Year: Not on file  Transportation Needs:   . Lack of Transportation (Medical): Not on file  . Lack of Transportation (Non-Medical): Not on file  Physical Activity:   . Days of Exercise per Week: Not on file  . Minutes of Exercise per Session: Not on file  Stress:   . Feeling of Stress : Not on file  Social  Connections:   . Frequency of Communication with Friends and Family: Not on file  . Frequency of Social Gatherings with Friends and Family: Not on file  . Attends Religious Services: Not on file  . Active Member of Clubs or Organizations: Not on file  . Attends Archivist Meetings: Not on file  . Marital Status: Not on file   Stewart Known Allergies Family History  Problem Relation Age of Onset  . Leukemia Mother   . Cancer Mother 108       leukemia  . Lung cancer Father        SMOKER  . Cancer Father 38       lung ca  . Heart disease Other   . Stroke Other   . Alcohol abuse Other   . Breast cancer Other     Current Outpatient Medications (Endocrine & Metabolic):  .  levothyroxine (SYNTHROID) 125 MCG tablet, Take 1 tablet (125 mcg total) by mouth daily.    Current Outpatient Medications (Analgesics):  .  aspirin 325 MG EC tablet, Take 325 mg by mouth daily.   [name]  ibuprofen (ADVIL,MOTRIN) 800 MG tablet, Take 1 tablet (800 mg total) by mouth every 8 (eight) hours as needed for moderate pain.  Current Outpatient Medications (Hematological):  [name]  Cyanocobalamin (VITAMIN B-12) 500 MCG SUBL, Place 1 tablet (500 mcg total) under the tongue 1 day or 1 dose.  Current Outpatient Medications (Other):  [name]  Cholecalciferol (VITAMIN D3) 1.25 MG (50000 UT) CAPS, Take 1 capsule by mouth once a week. .  Cholecalciferol (VITAMIN D3) 2000 units TABS, Take 1 capsule by mouth daily.    Past medical history, social, surgical and family history all reviewed in electronic medical record.  Stewart pertanent information unless stated regarding to the chief complaint.   Review of Systems:  Stewart headache, visual changes, nausea, vomiting, diarrhea, constipation, dizziness, abdominal pain, skin rash, fevers, chills, night sweats, weight loss, swollen lymph nodes, body aches, joint swelling, muscle aches, chest pain, shortness of breath, mood changes.   Objective  Blood pressure 126/82, pulse 82, height  5\' 2"  (1.575 m), weight 160 lb (72.6 kg), last menstrual period 09/20/2019, SpO2 98 %. Systems examined below as of    General: Stewart apparent distress alert and oriented x3 mood and affect normal, dressed appropriately.  HEENT: Pupils equal, extraocular movements intact  Respiratory: Patient's speak in full sentences and does not appear short of breath  Cardiovascular: Stewart lower extremity edema, non tender, Stewart erythema  Skin: Warm dry intact with Stewart signs of infection or rash on extremities or on axial skeleton.  Abdomen: Soft nontender  Neuro: Cranial nerves II through XII are intact, neurovascularly intact in all extremities with 2+ DTRs and 2+ pulses.  Lymph: Stewart lymphadenopathy of posterior or anterior cervical chain or axillae bilaterally.  Gait normal with good balance and coordination.  MSK:  Non tender with full range of motion and good stability and symmetric  strength and tone of shoulders, elbows, wrist, hip, and ankles bilaterally.   Left knee exam shows the patient still has some mild lateral tracking of the patella noted with a mild patella grind.  New onset of a McMurray's noted that it seems to be positive.  Tenderness more on the medial joint line than previous exam.  Full range of motion but pain with flexion of the knee.  Limited musculoskeletal ultrasound was performed and interpreted by Lyndal Pulley  Limited ultrasound of patient's knee shows that there is a medial meniscal tear with mild displacement of the Stewart Baker's cyst noted.  Minimal displacement.  Patient still has trace swelling of the patellofemoral but improved from previous exam.   Impression: Medial meniscal tear    Impression and Recommendations:     This case required medical decision making of moderate complexity. The above documentation has been reviewed and is accurate and complete Lyndal Pulley, DO       Note: This dictation was prepared with Dragon dictation along with smaller phrase technology.  Any transcriptional errors that result from this process are unintentional.

## 2019-10-30 ENCOUNTER — Ambulatory Visit: Payer: BC Managed Care – PPO | Attending: Internal Medicine

## 2019-10-30 DIAGNOSIS — Z20822 Contact with and (suspected) exposure to covid-19: Secondary | ICD-10-CM

## 2019-10-31 ENCOUNTER — Other Ambulatory Visit: Payer: BC Managed Care – PPO

## 2019-10-31 LAB — NOVEL CORONAVIRUS, NAA: SARS-CoV-2, NAA: NOT DETECTED

## 2019-11-02 ENCOUNTER — Other Ambulatory Visit: Payer: Self-pay

## 2019-11-02 ENCOUNTER — Encounter: Payer: Self-pay | Admitting: Internal Medicine

## 2019-11-02 ENCOUNTER — Ambulatory Visit (INDEPENDENT_AMBULATORY_CARE_PROVIDER_SITE_OTHER): Payer: BC Managed Care – PPO | Admitting: Internal Medicine

## 2019-11-02 DIAGNOSIS — E890 Postprocedural hypothyroidism: Secondary | ICD-10-CM

## 2019-11-02 NOTE — Patient Instructions (Signed)
Please continue levothyroxine 125 mcg daily.  Take the thyroid hormone every day, with water, at least 30 minutes before breakfast, separated by at least 4 hours from: - acid reflux medications - calcium - iron - multivitamins  Please come back for a follow-up appointment in 6 months.

## 2019-11-02 NOTE — Progress Notes (Signed)
Patient ID: Linda Stewart, female   DOB: 16-Feb-1971, 49 y.o.   MRN: [id number]   Patient location: Home My location: Office Persons participating in the virtual visit: patient, provider  Referring Provider: Cassandria Anger, MD  I connected with the patient on 11/02/19 at  4:00 PM EST by a video enabled telemedicine application and verified that I am speaking with the correct person.   I discussed the limitations of evaluation and management by telemedicine and the availability of in person appointments. The patient expressed understanding and agreed to proceed.   Details of the encounter are shown below.  HPI  Linda Stewart is a 49 y.o.-year-old female, returning for f/u for history of Graves ds, now with post ablative hypothyroidism. Last visit 6 months ago.  Reviewed and addended history: I saw the pt first in 2015, when she presented with thyrotoxicosis, but this was improving and I advised her to return for labs in 2 months, but did not intervene at that time. Pt. Was lost for f/u.   She then presented to PCP in 2017 >> still thyrotoxic >> advised to return to see me.  She was started on Propranolol 10 mg 3x a day by PCP.  We were able to decrease this gradually and then stop  In 05/2018, we increased her methimazole from 5 mg twice a day to 10 mg twice a day.  Subsequent labs were improved.    In 09/2018, I suggested RAI treatment after she returned after long absence with very abnormal TFTs.  Thyroid uptake and scan (10/14/2018): Was consistent with Graves' disease: Elevated 4 hour (54.1%) and 24 hour (66.6) RAI uptake consistent with hyperthyroidism.  Normal thyroid scan   RAI treatment (11/04/2018). She felt well after the treatment, without symptoms other than mild fatigue.  She developed post ablative hypothyroidism and was started on levothyroxine in 12/2018.  We continued to increase the dose afterwards, last dose change was 04/2019.  Pt is on levothyroxine 125  mcg daily, taken: - in am - fasting - ~1.5h from b'fast - no Ca, Fe, MVI, PPIs - not on Biotin  Reviewed patient's TFTs: Lab Results  Component Value Date   TSH 2.35 09/07/2019   TSH 2.38 07/06/2019   TSH 8.43 (H) 04/20/2019   TSH 25.18 (H) 03/03/2019   TSH 15.75 (H) 12/26/2018   TSH <0.01 (L) 09/16/2018   TSH <0.01 (L) 08/29/2018   TSH <0.01 (L) 06/03/2017   TSH 0.37 01/28/2017   TSH 4.110 12/08/2016   FREET4 1.12 09/07/2019   FREET4 1.19 07/06/2019   FREET4 0.80 04/20/2019   FREET4 0.82 03/03/2019   FREET4 0.38 (L) 12/26/2018   FREET4 1.42 09/16/2018   FREET4 1.40 08/29/2018   FREET4 1.70 (H) 06/03/2017   FREET4 0.92 01/28/2017   FREET4 0.75 (L) 12/08/2016    Progress antibodies were elevated: Lab Results  Component Value Date   TSI 205 (H) 09/16/2018   TSI 630 (H) 09/07/2016   Pt denies: - feeling nodules in neck - hoarseness - dysphagia - choking - SOB with lying down  Pt does not have a FH of thyroid ds. No FH of thyroid cancer. No h/o radiation tx to head or neck except for RAI treatment.  No herbal supplements. No Biotin use. No recent steroids use.   She also has a history of CVA - when in [place] (~49 y/o) - she was told at that time that her thyroid was abnormal. She also has an aneurysm - previously coiled.  She has ATIII deficiency, B12 deficiency.  She started Ergocalciferol once a week, and continues on vitamin D 5000 units daily.  ROS: Constitutional: no weight gain/no weight loss, no fatigue, no subjective hyperthermia, no subjective hypothermia Eyes: no blurry vision, no xerophthalmia ENT: no sore throat, + see HPI Cardiovascular: no CP/no SOB/no palpitations/no leg swelling Respiratory: no cough/no SOB/no wheezing Gastrointestinal: no N/no V/no D/no C/no acid reflux Musculoskeletal: no muscle aches/no joint aches Skin: no rashes, no hair loss Neurological: no tremors/no numbness/no tingling/no dizziness, + HA  I reviewed pt's medications,  allergies, PMH, social hx, family hx, and changes were documented in the history of present illness. Otherwise, unchanged from my initial visit note.  Past Medical History:  Diagnosis Date  . 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 Endometriosis determined by laparoscopy 02/28/2016  . 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 History of cerebral aneurysm repair dx 10-18-2006 via MRI/MRA  5.53mm x 4.35mm saccular   11-16-2006  stent-assisted coiling LICA intracranial superior hypophyseal region  . History of CVA (cerebrovascular accident)    2003  . History of ovarian cyst   . Pelvic pain in female 12/05/2015  . Uterine polyp 12/05/2015   Past Surgical History:  Procedure Laterality Date  . HYSTEROSCOPY WITH D & C N/A 02/28/2016   Procedure: DILATATION AND CURETTAGE /DIAGNOSTIC HYSTEROSCOPY;  Surgeon: Janyth Contes, MD;  Location: [facility];  Service: Gynecology;  Laterality: N/A;  . LAPAROSCOPIC OVARIAN CYSTECTOMY  2001  . LAPAROSCOPY N/A 02/28/2016   Procedure: OPERATIVE LAPAROSCOPY; LYSIS OF ADHESIONS;  Surgeon: Janyth Contes, MD;  Location: [facility];  Service: Gynecology;  Laterality: N/A;  . 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  11-16-2006   dr deveshwar   coiling  (general anesthesia)   Social History   Social History  . Marital status: Married    Spouse name: N/A  . Number of children: 2: 49 and 23 y/o in 2017   Occupational History  . Office Support - Environmental consultant    Social History Main Topics  . Smoking status: Never Smoker  . Smokeless tobacco: Never Used  . Alcohol use No  . Drug use: Tequila, beer - occasionally   Social History Narrative   Immigrated to [place] 1999 from [place]   Current Outpatient Medications on File Prior to Visit  Medication Sig Dispense Refill  . aspirin 325 MG EC tablet Take 325 mg by mouth daily.      .  Cholecalciferol (VITAMIN D3) 1.25 MG (50000 UT) CAPS Take 1 capsule by mouth once a week. 8 capsule 0  . Cholecalciferol (VITAMIN D3) 2000 units TABS Take 1 capsule by mouth daily.    . Cyanocobalamin (VITAMIN B-12) 500 MCG SUBL Place 1 tablet (500 mcg total) under the tongue 1 day or 1 dose. 100 tablet 3  . ibuprofen (ADVIL,MOTRIN) 800 MG tablet Take 1 tablet (800 mg total) by mouth every 8 (eight) hours as needed for moderate pain. 40 tablet 0  . levothyroxine (SYNTHROID) 125 MCG tablet Take 1 tablet (125 mcg total) by mouth daily. 90 tablet 3   No current facility-administered medications on file prior to visit.   No Known Allergies Family History  Problem Relation Age of Onset  . Leukemia Mother   . Cancer Mother 42       leukemia  . Lung cancer Father        SMOKER  .  Cancer Father 33       lung ca  . Heart disease Other   . Stroke Other   . Alcohol abuse Other   . Breast cancer Other    PE: There were no vitals taken for this visit. Wt Readings from Last 3 Encounters:  10/16/19 160 lb (72.6 kg)  09/18/19 162 lb (73.5 kg)  09/06/19 162 lb (73.5 kg)   Constitutional:  in NAD  The physical exam was not performed (virtual visit).  ASSESSMENT: 1. Graves ds.  PLAN:  1. Patient with history of Graves' disease, now status post RAI treatment in the end 10/2018, after which she rapidly developed hypothyroidism.  We started levothyroxine 75 mcg daily and she required an increase the dose was, currently 125 mcg daily increased at last visit. - latest thyroid labs reviewed with pt >> normal, stable, on the above levothyroxine dose: Lab Results  Component Value Date   TSH 2.35 09/07/2019   - she continues on LT4 125 mcg daily - pt feels good on this dose. - we discussed about taking the thyroid hormone every day, with water, >30 minutes before breakfast, separated by >4 hours from acid reflux medications, calcium, iron, multivitamins. Pt. is taking it correctly. - will check  thyroid tests at next OV - OTW, I will see her back in 6 months  Philemon Kingdom, MD PhD [facility] Endocrinology

## 2019-11-22 ENCOUNTER — Encounter: Payer: Self-pay | Admitting: Family Medicine

## 2019-11-22 ENCOUNTER — Ambulatory Visit: Payer: BC Managed Care – PPO | Admitting: Family Medicine

## 2019-11-22 ENCOUNTER — Ambulatory Visit: Payer: Self-pay

## 2019-11-22 ENCOUNTER — Other Ambulatory Visit: Payer: Self-pay

## 2019-11-22 VITALS — BP 110/82 | HR 86 | Ht 62.0 in | Wt 164.0 lb

## 2019-11-22 DIAGNOSIS — G8929 Other chronic pain: Secondary | ICD-10-CM | POA: Diagnosis not present

## 2019-11-22 DIAGNOSIS — S83242A Other tear of medial meniscus, current injury, left knee, initial encounter: Secondary | ICD-10-CM | POA: Diagnosis not present

## 2019-11-22 DIAGNOSIS — M25562 Pain in left knee: Secondary | ICD-10-CM | POA: Diagnosis not present

## 2019-11-22 NOTE — Progress Notes (Signed)
Linda Stewart [place] Phone: [phone] Subjective:   [name], am serving as a scribe for Dr. Hulan Saas. This visit occurred during the SARS-CoV-2 public health emergency.  Safety protocols were in place, including screening questions prior to the visit, additional usage of staff PPE, and extensive cleaning of exam room while observing appropriate contact time as indicated for disinfecting solutions.   I'm seeing this patient by the request  of:  Plotnikov, [name], MD  CC: Knee pain follow-up  [id number]   10/16/2019 Now that patient is effusion has improved patient does have what appears to be a medial meniscal tear.  I discussed with patient in great length about icing regimen, home exercise, which activities to do which wants to avoid.  Patient is to increase activity slowly over the course the next several days.  We discussed wearing the brace less at the moment.  As long as patient does relatively well we will continue with conservative therapy.  Worsening pain consider injections, formal physical therapy, or possible imaging.  [place] 11/22/2019 Linda Stewart is a 49 y.o. female coming in with complaint of left knee pain. Patient states  That is she is improving but does still have pain intermittently with no pattern. Can be walking or doing chores and will have pain over the patellar tendon. Also has pain with weather changes.      Past Medical History:  Diagnosis Date  . 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 Endometriosis determined by laparoscopy 02/28/2016  . 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 History of cerebral aneurysm repair dx 10-18-2006 via MRI/MRA  5.68mm x 4.66mm saccular   11-16-2006  stent-assisted coiling LICA intracranial superior hypophyseal region  . History of CVA (cerebrovascular accident)    2003  . History of ovarian cyst   . Pelvic pain in female  12/05/2015  . Uterine polyp 12/05/2015   Past Surgical History:  Procedure Laterality Date  . HYSTEROSCOPY WITH D & C N/A 02/28/2016   Procedure: DILATATION AND CURETTAGE /DIAGNOSTIC HYSTEROSCOPY;  Surgeon: Janyth Contes, MD;  Location: [facility];  Service: Gynecology;  Laterality: N/A;  . LAPAROSCOPIC OVARIAN CYSTECTOMY  2001  . LAPAROSCOPY N/A 02/28/2016   Procedure: OPERATIVE LAPAROSCOPY; LYSIS OF ADHESIONS;  Surgeon: Janyth Contes, MD;  Location: [facility];  Service: Gynecology;  Laterality: N/A;  . 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 Marital status: Married    Spouse name: Not on file  . Number of children: 2  . Years of education: Not on file  . Highest education level: Not on file  Occupational History  . 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  Tobacco Use  . Smoking status: Never Smoker  . Smokeless tobacco: Never Used  Substance and Sexual Activity  . Alcohol use: No  . Drug use: No  . Sexual activity: Yes  Other Topics Concern  . Not on file  Social History Narrative   Immigrated to [place] 1999 from [place]         Social Determinants of Health   Financial Resource Strain:   . Difficulty of Paying Living Expenses: Not on file  Food Insecurity:   . Worried About Charity fundraiser in  the Last Year: Not on file  . Ran Out of Food in the Last Year: Not on file  Transportation Needs:   . Lack of Transportation (Medical): Not on file  . Lack of Transportation (Non-Medical): Not on file  Physical Activity:   . Days of Exercise per Week: Not on file  . Minutes of Exercise per Session: Not on file  Stress:   . Feeling of Stress : Not on file  Social Connections:   . Frequency of Communication with Friends and Family: Not  on file  . Frequency of Social Gatherings with Friends and Family: Not on file  . Attends Religious Services: Not on file  . Active Member of Clubs or Organizations: Not on file  . Attends Archivist Meetings: Not on file  . Marital Status: Not on file   No Known Allergies Family History  Problem Relation Age of Onset  . Leukemia Mother   . Cancer Mother 46       leukemia  . Lung cancer Father        SMOKER  . Cancer Father 78       lung ca  . Heart disease Other   . Stroke Other   . Alcohol abuse Other   . Breast cancer Other     Current Outpatient Medications (Endocrine & Metabolic):  .  levothyroxine (SYNTHROID) 125 MCG tablet, Take 1 tablet (125 mcg total) by mouth daily.    Current Outpatient Medications (Analgesics):  .  aspirin 325 MG EC tablet, Take 325 mg by mouth daily.   [name]  ibuprofen (ADVIL,MOTRIN) 800 MG tablet, Take 1 tablet (800 mg total) by mouth every 8 (eight) hours as needed for moderate pain.  Current Outpatient Medications (Hematological):  [name]  Cyanocobalamin (VITAMIN B-12) 500 MCG SUBL, Place 1 tablet (500 mcg total) under the tongue 1 day or 1 dose.  Current Outpatient Medications (Other):  [name]  Cholecalciferol (VITAMIN D3) 1.25 MG (50000 UT) CAPS, Take 1 capsule by mouth once a week. .  Cholecalciferol (VITAMIN D3) 2000 units TABS, Take 1 capsule by mouth daily.   Reviewed prior external information including notes and imaging from  primary care provider As well as notes that were available from care everywhere and other healthcare systems.  Past medical history, social, surgical and family history all reviewed in electronic medical record.  No pertanent information unless stated regarding to the chief complaint.   Review of Systems:  No headache, visual changes, nausea, vomiting, diarrhea, constipation, dizziness, abdominal pain, skin rash, fevers, chills, night sweats, weight loss, swollen lymph nodes, body aches, joint swelling, chest  pain, shortness of breath, mood changes. POSITIVE muscle aches  Objective  Blood pressure 110/82, pulse 86, height 5\' 2"  (1.575 m), weight 164 lb (74.4 kg), SpO2 99 %.   General: No apparent distress alert and oriented x3 mood and affect normal, dressed appropriately.  HEENT: Pupils equal, extraocular movements intact  Respiratory: Patient's speak in full sentences and does not appear short of breath  Cardiovascular: No lower extremity edema, non tender, no erythema  Skin: Warm dry intact with no signs of infection or rash on extremities or on axial skeleton.  Abdomen: Soft nontender  Neuro: Cranial nerves II through XII are intact, neurovascularly intact in all extremities with 2+ DTRs and 2+ pulses.  Lymph: No lymphadenopathy of posterior or anterior cervical chain or axillae bilaterally.  Gait normal with good balance and coordination.  MSK:  Non tender with full range of motion  and good stability and symmetric strength and tone of shoulders, elbows, wrist, hip and ankles bilaterally.  Knee: Left Normal to inspection with no erythema or effusion or obvious bony abnormalities. Palpation normal with no warmth, joint line tenderness, patellar tenderness, or condyle tenderness. ROM full in flexion and extension and lower leg rotation. Ligaments with solid consistent endpoints including ACL, PCL, LCL, MCL. Positive Mcmurray's, Apley's, and Thessalonian tests. Positive painful patellar compression. Patellar glide with mild crepitus. Patellar and quadriceps tendons unremarkable. Hamstring and quadriceps strength is normal.] Contralateral knee unremarkable  Limited musculoskeletal ultrasound was performed and interpreted by Lyndal Pulley  Limited ultrasound shows the patient does have still some mild medial meniscal displacement noted but very minimal hypoechoic changes. Impression: Medial meniscal tear still noted   Impression and Recommendations:      The above documentation has  been reviewed and is accurate and complete Lyndal Pulley, DO       Note: This dictation was prepared with Dragon dictation along with smaller phrase technology. Any transcriptional errors that result from this process are unintentional.

## 2019-11-22 NOTE — Patient Instructions (Signed)
Heels March 1st IF not perfect see me in 8 weeks

## 2019-11-22 NOTE — Assessment & Plan Note (Signed)
Improvement noted.  Patient should do relatively well but does have some mild displacement.  We discussed the possibility if necessary for injections or formal physical therapy but with patient making improvement I think will do well.  Follow-up in 1 more time in 8 weeks if not completely resolved

## 2019-11-30 IMAGING — [facility] DIGITAL DIAGNOSTIC UNILATERAL RIGHT MAMMOGRAM WITH TOMO AND CAD
4 series · 4 of 12 positions shown · non-contrast
Comparison: Previous exam(s).

CLINICAL DATA: Right breast focal asymmetry seen on most recent
screening mammography.

EXAM:
DIGITAL DIAGNOSTIC RIGHT MAMMOGRAM WITH CAD AND TOMO
ULTRASOUND RIGHT BREAST

[R CC synth-2D]
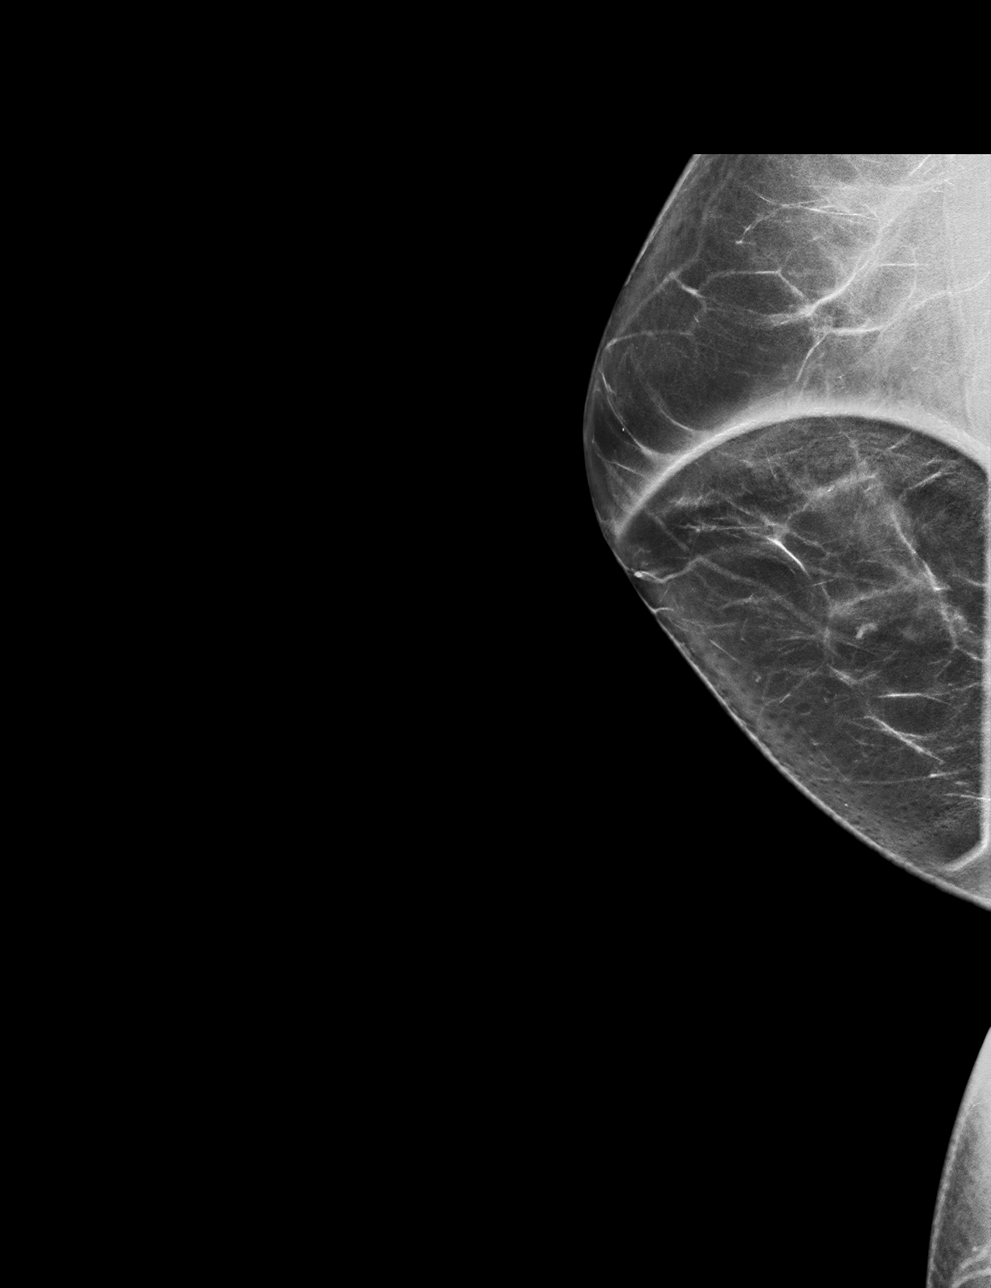

[R MLO synth-2D]
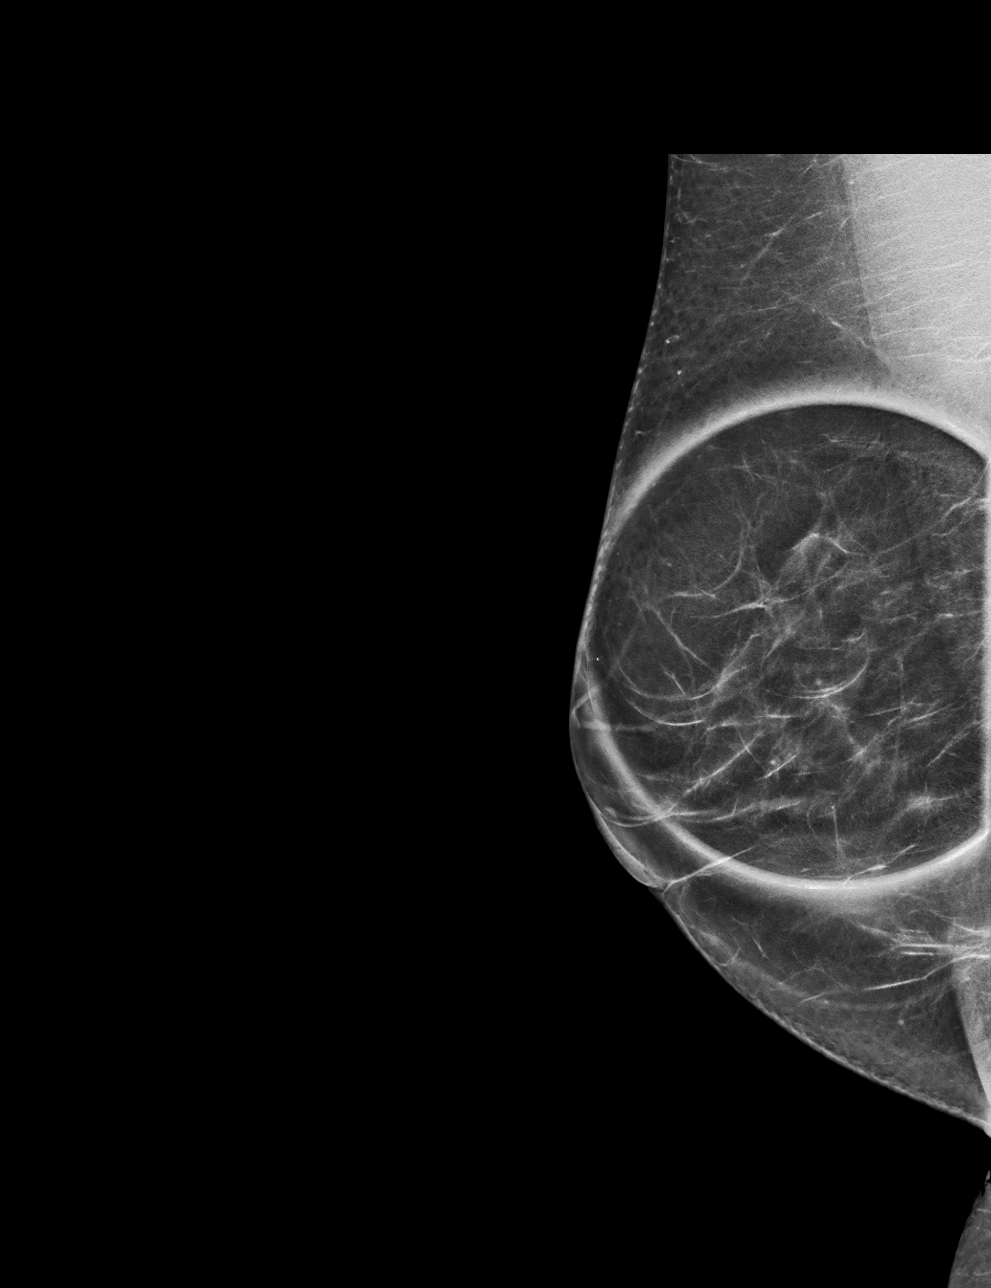

[R MLO tomo · tomo slice 31/62.0]
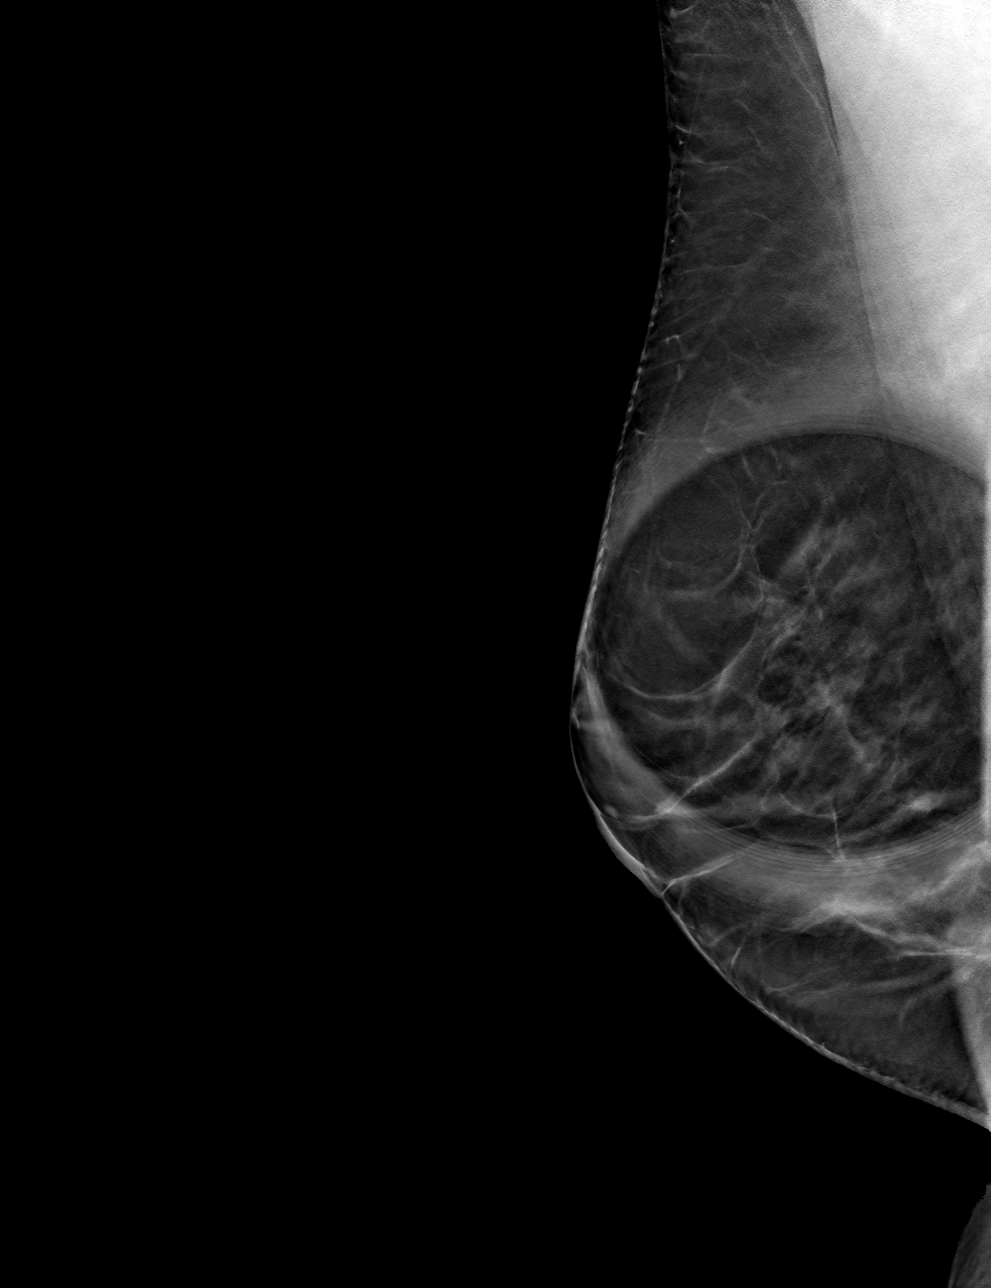

[R CC tomo · tomo slice 32/63.0]
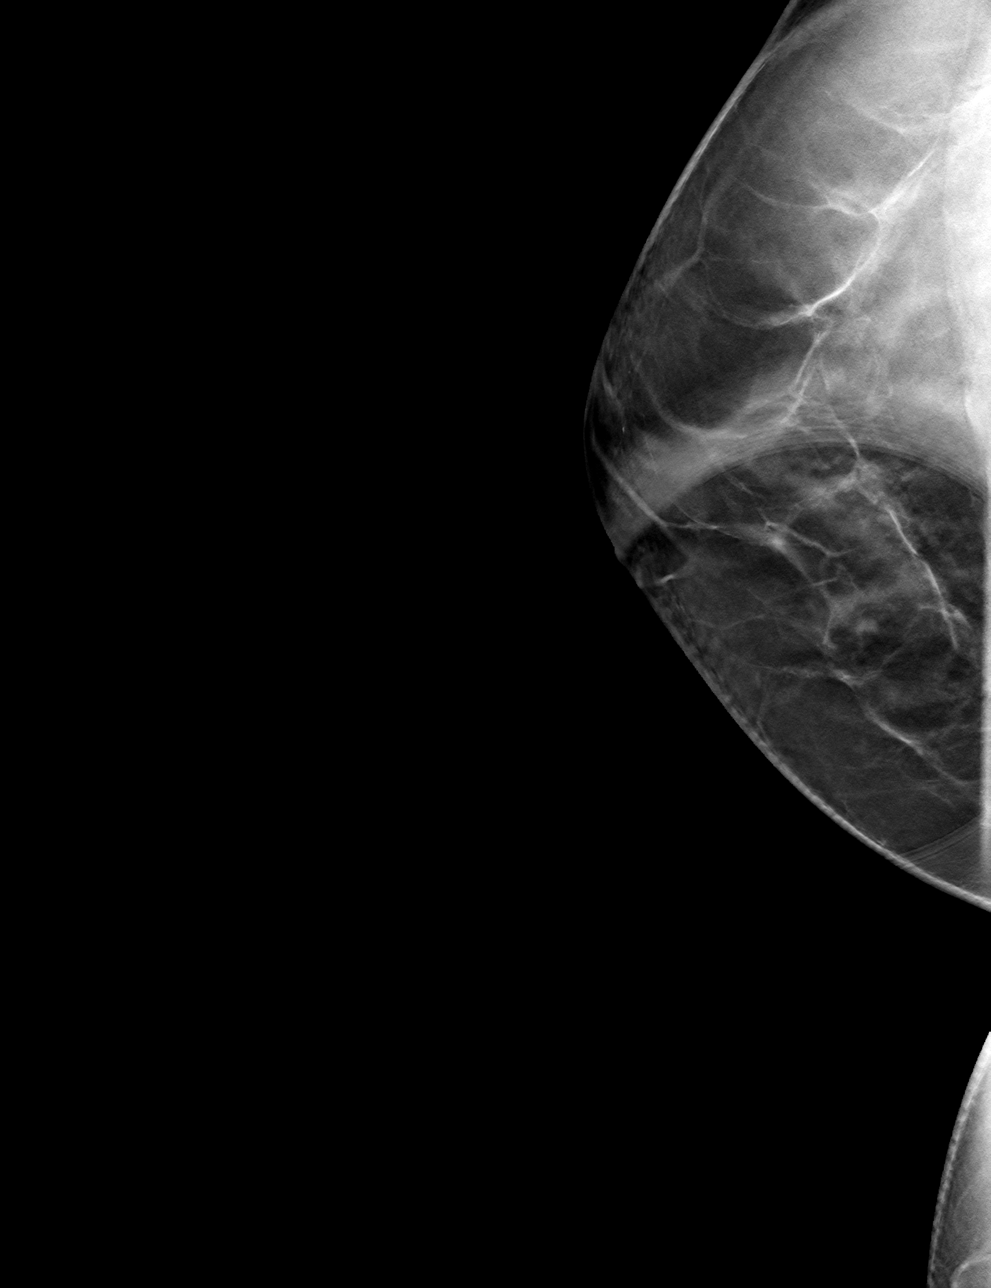

[4 of 12 positions shown; findings below may reference images not displayed]

ACR Breast Density Category c: The breast tissue is heterogeneously
dense, which may obscure small masses.
FINDINGS: Additional mammographic views of the right breast demonstrate no
suspicious masses or areas of architectural distortion. The
previously questioned asymmetry in the upper inner right breast
effaces to glandular tissue.

Mammographic images were processed with CAD.

On physical exam, no suspicious masses are palpated.

Targeted ultrasound is performed, showing no suspicious masses or
shadowing lesions.
IMPRESSION: No mammographic or sonographic evidence of malignancy in the right
breast.

RECOMMENDATION:
Screening mammogram in one year.(Code:[id number])

I have discussed the findings and recommendations with the patient.
Results were also provided in writing at the conclusion of the
visit. If applicable, a reminder letter will be sent to the patient
regarding the next appointment.

BI-RADS CATEGORY  1: Negative.

## 2020-01-17 ENCOUNTER — Ambulatory Visit: Payer: BC Managed Care – PPO | Admitting: Family Medicine

## 2020-04-12 ENCOUNTER — Emergency Department (HOSPITAL_COMMUNITY): Payer: BC Managed Care – PPO

## 2020-04-12 ENCOUNTER — Emergency Department (HOSPITAL_COMMUNITY)
Admission: EM | Admit: 2020-04-12 | Discharge: 2020-04-12 | Disposition: A | Discharge status: Home / Self Care | Payer: BC Managed Care – PPO | Attending: Emergency Medicine | Admitting: Emergency Medicine

## 2020-04-12 ENCOUNTER — Other Ambulatory Visit: Payer: Self-pay

## 2020-04-12 ENCOUNTER — Telehealth: Payer: Self-pay | Admitting: Internal Medicine

## 2020-04-12 ENCOUNTER — Encounter (HOSPITAL_COMMUNITY): Payer: Self-pay | Admitting: Emergency Medicine

## 2020-04-12 DIAGNOSIS — R519 Headache, unspecified: Secondary | ICD-10-CM | POA: Insufficient documentation

## 2020-04-12 DIAGNOSIS — M542 Cervicalgia: Secondary | ICD-10-CM | POA: Diagnosis not present

## 2020-04-12 DIAGNOSIS — E890 Postprocedural hypothyroidism: Secondary | ICD-10-CM | POA: Insufficient documentation

## 2020-04-12 DIAGNOSIS — R202 Paresthesia of skin: Secondary | ICD-10-CM | POA: Insufficient documentation

## 2020-04-12 DIAGNOSIS — Q0700 Arnold-Chiari syndrome without spina bifida or hydrocephalus: Secondary | ICD-10-CM | POA: Insufficient documentation

## 2020-04-12 DIAGNOSIS — Z79899 Other long term (current) drug therapy: Secondary | ICD-10-CM | POA: Diagnosis not present

## 2020-04-12 DIAGNOSIS — R200 Anesthesia of skin: Secondary | ICD-10-CM | POA: Diagnosis not present

## 2020-04-12 DIAGNOSIS — Z7982 Long term (current) use of aspirin: Secondary | ICD-10-CM | POA: Insufficient documentation

## 2020-04-12 DIAGNOSIS — Z8679 Personal history of other diseases of the circulatory system: Secondary | ICD-10-CM | POA: Diagnosis not present

## 2020-04-12 DIAGNOSIS — D485 Neoplasm of uncertain behavior of skin: Secondary | ICD-10-CM | POA: Insufficient documentation

## 2020-04-12 LAB — PROTIME-INR
INR: 1 (ref 0.8–1.2)
Prothrombin Time: 12.4 seconds (ref 11.4–15.2)

## 2020-04-12 LAB — I-STAT CHEM 8, ED
BUN: 17 mg/dL (ref 6–20)
Calcium, Ion: 1.21 mmol/L (ref 1.15–1.40)
Chloride: 102 mmol/L (ref 98–111)
Creatinine, Ser: 0.7 mg/dL (ref 0.44–1.00)
Glucose, Bld: 96 mg/dL (ref 70–99)
HCT: 38 % (ref 36.0–46.0)
Hemoglobin: 12.9 g/dL (ref 12.0–15.0)
Potassium: 4 mmol/L (ref 3.5–5.1)
Sodium: 141 mmol/L (ref 135–145)
TCO2: 25 mmol/L (ref 22–32)

## 2020-04-12 LAB — DIFFERENTIAL
Abs Immature Granulocytes: 0.01 10*3/uL (ref 0.00–0.07)
Basophils Absolute: 0 10*3/uL (ref 0.0–0.1)
Basophils Relative: 1 %
Eosinophils Absolute: 0.1 10*3/uL (ref 0.0–0.5)
Eosinophils Relative: 3 %
Immature Granulocytes: 0 %
Lymphocytes Relative: 40 %
Lymphs Abs: 1.6 10*3/uL (ref 0.7–4.0)
Monocytes Absolute: 0.4 10*3/uL (ref 0.1–1.0)
Monocytes Relative: 10 %
Neutro Abs: 1.8 10*3/uL (ref 1.7–7.7)
Neutrophils Relative %: 46 %

## 2020-04-12 LAB — COMPREHENSIVE METABOLIC PANEL
ALT: 17 U/L (ref 0–44)
AST: 16 U/L (ref 15–41)
Albumin: 3.9 g/dL (ref 3.5–5.0)
Alkaline Phosphatase: 45 U/L (ref 38–126)
Anion gap: 9 (ref 5–15)
BUN: 15 mg/dL (ref 6–20)
CO2: 25 mmol/L (ref 22–32)
Calcium: 9.3 mg/dL (ref 8.9–10.3)
Chloride: 105 mmol/L (ref 98–111)
Creatinine, Ser: 0.74 mg/dL (ref 0.44–1.00)
GFR calc Af Amer: >60 mL/min (ref >60)
GFR calc non Af Amer: >60 mL/min (ref >60)
Glucose, Bld: 99 mg/dL (ref 70–99)
Potassium: 4 mmol/L (ref 3.5–5.1)
Sodium: 139 mmol/L (ref 135–145)
Total Bilirubin: 0.6 mg/dL (ref 0.3–1.2)
Total Protein: 7.4 g/dL (ref 6.5–8.1)

## 2020-04-12 LAB — APTT: aPTT: 30 seconds (ref 24–36)

## 2020-04-12 LAB — CBC
HCT: 36.8 % (ref 36.0–46.0)
Hemoglobin: 11.8 g/dL — ABNORMAL LOW (ref 12.0–15.0)
MCH: 25.7 pg — ABNORMAL LOW (ref 26.0–34.0)
MCHC: 32.1 g/dL (ref 30.0–36.0)
MCV: 80.2 fL (ref 80.0–100.0)
Platelets: 285 10*3/uL (ref 150–400)
RBC: 4.59 MIL/uL (ref 3.87–5.11)
RDW: 15.2 % (ref 11.5–15.5)
WBC: 3.9 10*3/uL — ABNORMAL LOW (ref 4.0–10.5)
nRBC: 0 % (ref 0.0–0.2)

## 2020-04-12 LAB — CBG MONITORING, ED: Glucose-Capillary: 93 mg/dL (ref 70–99)

## 2020-04-12 MED ORDER — SODIUM CHLORIDE 0.9% FLUSH: 3.0000 mL | Freq: Once | INTRAVENOUS | Status: DC

## 2020-04-12 NOTE — Telephone Encounter (Signed)
[facility] Report/Call : ---caller states having numbness on one side of body.   Patient advised GO to ED now, Patient went to ED as advised.

## 2020-04-12 NOTE — Discharge Instructions (Signed)
You have no evidence of strokes or aneurysms on your MRI, your other testing is also been normal, you may return for any severe or worsening symptoms but please follow-up with your doctor within the week.  Make sure you are taking enough vitamin B12 or a multivitamin daily.

## 2020-04-12 NOTE — ED Notes (Signed)
Pt states she has a "heavy" feeling of "pressure" in her neck, as well as a "tired" feeling that is like an intermittent feeling in her right side. She states she has a slight pressure feeling in her head as well. Her Rt sided numbness/tinghling comes & goes, and is not present at this time. D/t her past with having an aneurism she felt worried and came in for evaluation.

## 2020-04-12 NOTE — ED Triage Notes (Signed)
Pt here with c/o right sided numbness off and on for 1 week , pt had a aneurysm repair here in 08 , last Mri 2019

## 2020-04-12 NOTE — Telephone Encounter (Signed)
   Patient calling to report numbness and tingling on her right side of body, also reporting headache x1 week. Appointment 7/13  Call transferred to [facility]

## 2020-04-12 NOTE — ED Provider Notes (Signed)
[facility] DEPARTMENT Provider Note   CSN: [id number] Arrival date & time: 04/12/20  1017     History No chief complaint on file.   Linda Stewart is a 49 y.o. female.  HPI   49 year old female, she has history of a cerebral aneurysm repair with coiling which was performed in 2008, she has no prior history of stroke, she does take vitamins as well as a full aspirin daily.  She presents to the hospital after having some fluctuating symptoms over the last week or so involving mostly the right arm and hand, it is predominantly numbness and tingling of the hand with a feeling of discoordination though she states that she does not feel particularly uncoordinated is much as a pressure in the arm.  Last night she was painting with a paint brush and had some difficulty holding the pain brush dropping it occasionally but also notes that she is right-handed and has not had any difficulties performing daily tasks.  She denies facial symptoms, visual symptoms, speech symptoms and has no difficulty swallowing or walking.  Last night she did feel some numbness into her right leg as well as the right hand.  No chest pain or palpitations, no coughing fevers or shortness of breath.  Past Medical History:  Diagnosis Date  . 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White)   . Endometriosis determined by laparoscopy 02/28/2016  . 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 History of cerebral aneurysm repair dx 10-18-2006 via MRI/MRA  5.66mm x 4.76mm saccular   11-16-2006  stent-assisted coiling LICA intracranial superior hypophyseal region  . History of CVA (cerebrovascular accident)    2003  . History of ovarian cyst   . Pelvic pain in female 12/05/2015  . Uterine polyp 12/05/2015    Patient Active Problem List   Diagnosis Date Noted  . 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 Acute medial meniscal tear, left, initial encounter 10/16/2019  . Left knee pain 09/18/2019  .  Abdominal pain 09/06/2019  . Weight gain 09/06/2019  . Left wrist pain 02/26/2017  . Cramps, extremity 12/10/2016  . Loss of weight 08/17/2016  . Endometriosis determined by laparoscopy 02/28/2016  . Pelvic pain in female 12/05/2015  . Uterine polyp 12/05/2015  . Rectal pain 12/06/2013  . LLQ abdominal pain 10/19/2013  . Graves disease 10/18/2013  . Hematuria 10/18/2013  . Paresthesia 11/25/2011  . B12 deficiency 11/25/2011  . Well adult exam 07/17/2011  . Neoplasm of uncertain behavior of skin 07/17/2011  . CONJUNCTIVITIS, ACUTE 08/06/2010  . PNEUMONIA 07/03/2010  . Headache 06/04/2010  . TORTICOLLIS, SPASMODIC 04/02/2010  . NECK PAIN, LEFT 04/02/2010  . Fatigue 06/03/2009  . Low back pain 03/08/2009  . 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  . Brain aneurysm 06/03/2007    Past Surgical History:  Procedure Laterality Date  . HYSTEROSCOPY WITH D & C N/A 02/28/2016   Procedure: DILATATION AND CURETTAGE /DIAGNOSTIC HYSTEROSCOPY;  Surgeon: Janyth Contes, MD;  Location: [facility];  Service: Gynecology;  Laterality: N/A;  . LAPAROSCOPIC OVARIAN CYSTECTOMY  2001  . LAPAROSCOPY N/A 02/28/2016   Procedure: OPERATIVE LAPAROSCOPY; LYSIS OF ADHESIONS;  Surgeon: Janyth Contes, MD;  Location: [facility];  Service: Gynecology;  Laterality: N/A;  . 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  11-16-2006   dr deveshwar   coiling  (general anesthesia)     OB History   No obstetric history on file.  Family History  Problem Relation Age of Onset  . Leukemia Mother   . Cancer Mother 25       leukemia  . Lung cancer Father        SMOKER  . Cancer Father 74       lung ca  . Heart disease Other   . Stroke Other   . Alcohol abuse Other   . Breast cancer Other     Social History   Tobacco Use  . Smoking status: Never Smoker  . Smokeless tobacco: Never Used    Substance Use Topics  . Alcohol use: No  . Drug use: No    Home Medications Prior to Admission medications   Medication Sig Start Date End Date Taking? Authorizing Provider  aspirin 325 MG EC tablet Take 325 mg by mouth daily.     Yes [provider]  Cholecalciferol (VITAMIN D3) 2000 units TABS Take 2,000 Units by mouth daily.    Yes [provider]  Cyanocobalamin (VITAMIN B-12) 500 MCG SUBL Place 1 tablet (500 mcg total) under the tongue 1 day or 1 dose. 11/25/11  Yes Plotnikov, [name], MD  levothyroxine (SYNTHROID) 125 MCG tablet Take 1 tablet (125 mcg total) by mouth daily. 10/16/19  Yes Philemon Kingdom, MD  Cholecalciferol (VITAMIN D3) 1.25 MG (50000 UT) CAPS Take 1 capsule by mouth once a week. Patient not taking: Reported on 04/12/2020 09/08/19   Plotnikov, [name], MD  ibuprofen (ADVIL,MOTRIN) 800 MG tablet Take 1 tablet (800 mg total) by mouth every 8 (eight) hours as needed for moderate pain. Patient not taking: Reported on 04/12/2020 02/28/16   Janyth Contes, MD    Allergies    Patient has no known allergies.  Review of Systems   Review of Systems  All other systems reviewed and are negative.   Physical Exam Updated Vital Signs BP 115/67   Pulse 60   Temp 98.3 F (36.8 C) (Oral)   Resp 12   Ht 1.549 m (5\' 1" )   Wt 72.6 kg   SpO2 99%   BMI 30.23 kg/m   Physical Exam Vitals and nursing note reviewed.  Constitutional:      General: She is not in acute distress.    Appearance: She is well-developed.  HENT:     Head: Normocephalic and atraumatic.     Mouth/Throat:     Pharynx: No oropharyngeal exudate.  Eyes:     General: No scleral icterus.       Right eye: No discharge.        Left eye: No discharge.     Conjunctiva/sclera: Conjunctivae normal.     Pupils: Pupils are equal, round, and reactive to light.  Neck:     Thyroid: No thyromegaly.     Vascular: No JVD.  Cardiovascular:     Rate and Rhythm: Normal rate and regular  rhythm.     Heart sounds: Normal heart sounds. No murmur heard.  No friction rub. No gallop.   Pulmonary:     Effort: Pulmonary effort is normal. No respiratory distress.     Breath sounds: Normal breath sounds. No wheezing or rales.  Abdominal:     General: Bowel sounds are normal. There is no distension.     Palpations: Abdomen is soft. There is no mass.     Tenderness: There is no abdominal tenderness.  Musculoskeletal:        General: No tenderness. Normal range of motion.     Cervical back:  Normal range of motion and neck supple.  Lymphadenopathy:     Cervical: No cervical adenopathy.  Skin:    General: Skin is warm and dry.     Findings: No erythema or rash.  Neurological:     Mental Status: She is alert.     Coordination: Coordination normal.     Comments: Neurologic exam is unremarkable, she is able to grip bilaterally, strength in all 4 extremities is normal to major muscle groups.  She has normal sensation diffusely including both arms and both legs and both hands.  There is normal facial symmetry with sensation.  Cranial nerves III through XII are normal, finger-nose-finger is normal, mentation is normal, orientation is normal, cognition is normal, normal level of alertness.  Psychiatric:        Behavior: Behavior normal.     ED Results / Procedures / Treatments   Labs (all labs ordered are listed, but only abnormal results are displayed) Labs Reviewed  CBC - Abnormal; Notable for the following components:      Result Value   WBC 3.9 (*)    Hemoglobin 11.8 (*)    MCH 25.7 (*)    All other components within normal limits  PROTIME-INR  APTT  DIFFERENTIAL  COMPREHENSIVE METABOLIC PANEL  I-STAT CHEM 8, ED  CBG MONITORING, ED    EKG EKG Interpretation  Date/Time:  Friday April 12 2020 11:07:51 EDT Ventricular Rate:  64 PR Interval:  142 QRS Duration: 82 QT Interval:  382 QTC Calculation: 394 R Axis:   -35 Text Interpretation: Normal sinus rhythm Left axis  deviation Low voltage QRS Abnormal ECG No old tracing to compare Confirmed by Noemi Chapel [phone]) on 04/12/2020 11:18:08 AM   Radiology MR ANGIO HEAD WO CONTRAST  Result Date: 04/12/2020 CLINICAL DATA:  Head and neck pressure and heaviness, history of treated aneurysm EXAM: MRI HEAD WITHOUT CONTRAST MRA HEAD WITHOUT CONTRAST TECHNIQUE: Multiplanar, multiecho pulse sequences of the brain and surrounding structures were obtained without intravenous contrast. Angiographic images of the head were obtained using MRA technique without contrast. COMPARISON:  2019 FINDINGS: MRI HEAD Brain: There is no acute infarction or intracranial hemorrhage. There is no intracranial mass, mass effect, or edema. There is no hydrocephalus or extra-axial fluid collection. Ventricles and sulci are normal in size and configuration. Minimal small foci of T2 hyperintensity in the supratentorial white matter likely reflect nonspecific gliosis/demyelination of doubtful clinical significance. Vascular: Major vessel flow voids at the skull base are preserved. Skull and upper cervical spine: Normal marrow signal is preserved. Sinuses/Orbits: Paranasal sinuses are aerated. Orbits are unremarkable. Other: Sella is unremarkable.  Mastoid air cells are clear. MRA HEAD Intracranial internal carotid arteries are patent. There is diminished flow related enhancement of cavernous and proximal supraclinoid left ICA secondary to susceptibility artifact from stent placement. Tiny neck remnant previously seen at the base of the treated left superior hypophyseal aneurysm is again seen though somewhat less apparent on this study. Middle and anterior cerebral arteries are patent. Intracranial vertebral arteries, basilar artery, posterior cerebral arteries are patent. Bilateral posterior communicating arteries are present. There is no significant stenosis or new aneurysm. IMPRESSION: No acute intracranial abnormality. Treated left ICA superior hypophyseal  region aneurysm with stable to decreased tiny neck remnant. Electronically Signed   By: Macy Mis M.D.   On: 04/12/2020 14:54   MR BRAIN WO CONTRAST  Result Date: 04/12/2020 CLINICAL DATA:  Head and neck pressure and heaviness, history of treated aneurysm EXAM: MRI HEAD WITHOUT CONTRAST  MRA HEAD WITHOUT CONTRAST TECHNIQUE: Multiplanar, multiecho pulse sequences of the brain and surrounding structures were obtained without intravenous contrast. Angiographic images of the head were obtained using MRA technique without contrast. COMPARISON:  2019 FINDINGS: MRI HEAD Brain: There is no acute infarction or intracranial hemorrhage. There is no intracranial mass, mass effect, or edema. There is no hydrocephalus or extra-axial fluid collection. Ventricles and sulci are normal in size and configuration. Minimal small foci of T2 hyperintensity in the supratentorial white matter likely reflect nonspecific gliosis/demyelination of doubtful clinical significance. Vascular: Major vessel flow voids at the skull base are preserved. Skull and upper cervical spine: Normal marrow signal is preserved. Sinuses/Orbits: Paranasal sinuses are aerated. Orbits are unremarkable. Other: Sella is unremarkable.  Mastoid air cells are clear. MRA HEAD Intracranial internal carotid arteries are patent. There is diminished flow related enhancement of cavernous and proximal supraclinoid left ICA secondary to susceptibility artifact from stent placement. Tiny neck remnant previously seen at the base of the treated left superior hypophyseal aneurysm is again seen though somewhat less apparent on this study. Middle and anterior cerebral arteries are patent. Intracranial vertebral arteries, basilar artery, posterior cerebral arteries are patent. Bilateral posterior communicating arteries are present. There is no significant stenosis or new aneurysm. IMPRESSION: No acute intracranial abnormality. Treated left ICA superior hypophyseal region  aneurysm with stable to decreased tiny neck remnant. Electronically Signed   By: Macy Mis M.D.   On: 04/12/2020 14:54    Procedures Procedures (including critical care time)  Medications Ordered in ED Medications  sodium chloride flush (NS) 0.9 % injection 3 mL (0 mLs Intravenous Hold 04/12/20 1129)    ED Course  I have reviewed the triage vital signs and the nursing notes.  Pertinent labs & imaging results that were available during my care of the patient were reviewed by me and considered in my medical decision making (see chart for details).    MDM Rules/Calculators/A&P                          This patient's exam is reassuring however her symptomatology over the last week suggest a possible anatomical cause.  She will have an MRI of the brain to further evaluate not only aneurysm but for ischemia.  The rest of this is reassuring, vital signs are reassuring.   Pt agreeable.  MRI normal Labs normal Pt updated - and stable for d/c  Final Clinical Impression(s) / ED Diagnoses Final diagnoses:  Numbness      Noemi Chapel, MD 04/12/20 1521

## 2020-04-12 NOTE — ED Notes (Signed)
Patient transported to MRI 

## 2020-04-16 ENCOUNTER — Other Ambulatory Visit: Payer: Self-pay

## 2020-04-16 ENCOUNTER — Other Ambulatory Visit (INDEPENDENT_AMBULATORY_CARE_PROVIDER_SITE_OTHER): Payer: BC Managed Care – PPO

## 2020-04-16 ENCOUNTER — Ambulatory Visit (INDEPENDENT_AMBULATORY_CARE_PROVIDER_SITE_OTHER): Payer: BC Managed Care – PPO | Admitting: Internal Medicine

## 2020-04-16 ENCOUNTER — Encounter: Payer: Self-pay | Admitting: Internal Medicine

## 2020-04-16 VITALS — BP 120/74 | HR 72 | Temp 98.3°F | Resp 16 | Ht 61.0 in | Wt 162.2 lb

## 2020-04-16 DIAGNOSIS — G43109 Migraine with aura, not intractable, without status migrainosus: Secondary | ICD-10-CM | POA: Diagnosis not present

## 2020-04-16 DIAGNOSIS — E890 Postprocedural hypothyroidism: Secondary | ICD-10-CM | POA: Diagnosis not present

## 2020-04-16 LAB — TSH: TSH: 1.08 u[IU]/mL (ref 0.35–4.50)

## 2020-04-16 MED ORDER — UBRELVY 100 MG PO TABS: 1.0000 | ORAL_TABLET | Freq: Every day | ORAL | 1 refills | Status: DC | PRN

## 2020-04-16 NOTE — Patient Instructions (Signed)

## 2020-04-16 NOTE — Progress Notes (Signed)
Subjective:  Patient ID: Linda Stewart, female    DOB: 05-12-1971  Age: 49 y.o. MRN: [id number]  CC: Hypothyroidism and Headache  This visit occurred during the SARS-CoV-2 public health emergency.  Safety protocols were in place, including screening questions prior to the visit, additional usage of staff PPE, and extensive cleaning of exam room while observing appropriate contact time as indicated for disinfecting solutions.    HPI Linda Stewart presents for f/up - She developed a severe headache about 5 days ago.  She has a history of an aneurysm so we she was seen at an urgent care center and an MRI MRA of her brain was reassuring.  She reports a prior history of menstrual migraine and this recent headache was associated with her menstrual cycle.  She did not get much symptom relief with Tylenol.  She tells me the headache has dissipated.  She denies nausea, vomiting, blurred vision, slurred speech, ataxia, or paresthesias.  Outpatient Medications Prior to Visit  Medication Sig Dispense Refill  . aspirin 325 MG EC tablet Take 325 mg by mouth daily.      . Cholecalciferol (VITAMIN D3) 2000 units TABS Take 2,000 Units by mouth daily.     . Cyanocobalamin (VITAMIN B-12) 500 MCG SUBL Place 1 tablet (500 mcg total) under the tongue 1 day or 1 dose. 100 tablet 3  . levothyroxine (SYNTHROID) 125 MCG tablet Take 1 tablet (125 mcg total) by mouth daily. 90 tablet 3  . Cholecalciferol (VITAMIN D3) 1.25 MG (50000 UT) CAPS Take 1 capsule by mouth once a week. (Patient not taking: Reported on 04/12/2020) 8 capsule 0  . ibuprofen (ADVIL,MOTRIN) 800 MG tablet Take 1 tablet (800 mg total) by mouth every 8 (eight) hours as needed for moderate pain. (Patient not taking: Reported on 04/12/2020) 40 tablet 0   No facility-administered medications prior to visit.    ROS Review of Systems  Constitutional: Negative.  Negative for fatigue and fever.  HENT: Negative.   Eyes: Negative.  Negative for pain and  visual disturbance.  Respiratory: Negative for cough, chest tightness, shortness of breath and wheezing.   Cardiovascular: Negative for chest pain, palpitations and leg swelling.  Gastrointestinal: Negative for abdominal pain, diarrhea, nausea and vomiting.  Endocrine: Negative for cold intolerance and heat intolerance.  Genitourinary: Negative.  Negative for difficulty urinating.  Musculoskeletal: Negative.  Negative for arthralgias.  Skin: Negative.  Negative for color change.  Neurological: Positive for headaches. Negative for dizziness, tremors, seizures, syncope, speech difficulty, weakness, light-headedness and numbness.  Hematological: Negative for adenopathy. Does not bruise/bleed easily.  Psychiatric/Behavioral: Negative.     Objective:  BP 120/74 (BP Location: Left Arm, Patient Position: Sitting, Cuff Size: Normal)   Pulse 72   Temp 98.3 F (36.8 C) (Oral)   Resp 16   Ht 5\' 1"  (1.549 m)   Wt 162 lb 4 oz (73.6 kg)   LMP 04/01/2020 (Exact Date)   SpO2 97%   BMI 30.66 kg/m   BP Readings from Last 3 Encounters:  04/16/20 120/74  04/12/20 134/86  11/22/19 110/82    Wt Readings from Last 3 Encounters:  04/16/20 162 lb 4 oz (73.6 kg)  04/12/20 160 lb (72.6 kg)  11/22/19 164 lb (74.4 kg)    Physical Exam Vitals reviewed.  Constitutional:      General: She is not in acute distress.    Appearance: She is well-developed. She is not ill-appearing, toxic-appearing or diaphoretic.  Eyes:     Extraocular  Movements: Extraocular movements intact.     Right eye: Normal extraocular motion and no nystagmus.     Left eye: Normal extraocular motion and no nystagmus.     Pupils: Pupils are equal, round, and reactive to light. Pupils are equal.  Cardiovascular:     Rate and Rhythm: Normal rate and regular rhythm.     Heart sounds: No murmur heard.   Pulmonary:     Effort: Pulmonary effort is normal.     Breath sounds: No wheezing, rhonchi or rales.  Abdominal:     General:  Abdomen is flat.     Palpations: Abdomen is soft. There is no mass.     Tenderness: There is no abdominal tenderness.  Musculoskeletal:        General: Normal range of motion.     Cervical back: Normal range of motion and neck supple.     Right lower leg: No edema.  Skin:    General: Skin is warm and dry.     Coloration: Skin is not pale.  Neurological:     General: No focal deficit present.     Mental Status: She is alert and oriented to person, place, and time. Mental status is at baseline.     Cranial Nerves: No cranial nerve deficit.     Sensory: No sensory deficit.     Motor: No weakness.     Coordination: Coordination normal.     Gait: Gait normal.     Deep Tendon Reflexes: Reflexes normal.  Psychiatric:        Mood and Affect: Mood normal.        Behavior: Behavior normal.        Thought Content: Thought content normal.        Judgment: Judgment normal.     Lab Results  Component Value Date   WBC 3.9 (L) 04/12/2020   HGB 12.9 04/12/2020   HCT 38.0 04/12/2020   PLT 285 04/12/2020   GLUCOSE 96 04/12/2020   CHOL 204 (H) 09/07/2019   TRIG 116.0 09/07/2019   HDL 58.90 09/07/2019   LDLCALC 122 (H) 09/07/2019   ALT 17 04/12/2020   AST 16 04/12/2020   NA 141 04/12/2020   K 4.0 04/12/2020   CL 102 04/12/2020   CREATININE 0.70 04/12/2020   BUN 17 04/12/2020   CO2 25 04/12/2020   TSH 1.08 04/16/2020   INR 1.0 04/12/2020    MR ANGIO HEAD WO CONTRAST  Result Date: 04/12/2020 CLINICAL DATA:  Head and neck pressure and heaviness, history of treated aneurysm EXAM: MRI HEAD WITHOUT CONTRAST MRA HEAD WITHOUT CONTRAST TECHNIQUE: Multiplanar, multiecho pulse sequences of the brain and surrounding structures were obtained without intravenous contrast. Angiographic images of the head were obtained using MRA technique without contrast. COMPARISON:  2019 FINDINGS: MRI HEAD Brain: There is no acute infarction or intracranial hemorrhage. There is no intracranial mass, mass effect, or  edema. There is no hydrocephalus or extra-axial fluid collection. Ventricles and sulci are normal in size and configuration. Minimal small foci of T2 hyperintensity in the supratentorial white matter likely reflect nonspecific gliosis/demyelination of doubtful clinical significance. Vascular: Major vessel flow voids at the skull base are preserved. Skull and upper cervical spine: Normal marrow signal is preserved. Sinuses/Orbits: Paranasal sinuses are aerated. Orbits are unremarkable. Other: Sella is unremarkable.  Mastoid air cells are clear. MRA HEAD Intracranial internal carotid arteries are patent. There is diminished flow related enhancement of cavernous and proximal supraclinoid left ICA secondary to susceptibility artifact  from stent placement. Tiny neck remnant previously seen at the base of the treated left superior hypophyseal aneurysm is again seen though somewhat less apparent on this study. Middle and anterior cerebral arteries are patent. Intracranial vertebral arteries, basilar artery, posterior cerebral arteries are patent. Bilateral posterior communicating arteries are present. There is no significant stenosis or new aneurysm. IMPRESSION: No acute intracranial abnormality. Treated left ICA superior hypophyseal region aneurysm with stable to decreased tiny neck remnant. Electronically Signed   By: Macy Mis M.D.   On: 04/12/2020 14:54   MR BRAIN WO CONTRAST  Result Date: 04/12/2020 CLINICAL DATA:  Head and neck pressure and heaviness, history of treated aneurysm EXAM: MRI HEAD WITHOUT CONTRAST MRA HEAD WITHOUT CONTRAST TECHNIQUE: Multiplanar, multiecho pulse sequences of the brain and surrounding structures were obtained without intravenous contrast. Angiographic images of the head were obtained using MRA technique without contrast. COMPARISON:  2019 FINDINGS: MRI HEAD Brain: There is no acute infarction or intracranial hemorrhage. There is no intracranial mass, mass effect, or edema. There  is no hydrocephalus or extra-axial fluid collection. Ventricles and sulci are normal in size and configuration. Minimal small foci of T2 hyperintensity in the supratentorial white matter likely reflect nonspecific gliosis/demyelination of doubtful clinical significance. Vascular: Major vessel flow voids at the skull base are preserved. Skull and upper cervical spine: Normal marrow signal is preserved. Sinuses/Orbits: Paranasal sinuses are aerated. Orbits are unremarkable. Other: Sella is unremarkable.  Mastoid air cells are clear. MRA HEAD Intracranial internal carotid arteries are patent. There is diminished flow related enhancement of cavernous and proximal supraclinoid left ICA secondary to susceptibility artifact from stent placement. Tiny neck remnant previously seen at the base of the treated left superior hypophyseal aneurysm is again seen though somewhat less apparent on this study. Middle and anterior cerebral arteries are patent. Intracranial vertebral arteries, basilar artery, posterior cerebral arteries are patent. Bilateral posterior communicating arteries are present. There is no significant stenosis or new aneurysm. IMPRESSION: No acute intracranial abnormality. Treated left ICA superior hypophyseal region aneurysm with stable to decreased tiny neck remnant. Electronically Signed   By: Macy Mis M.D.   On: 04/12/2020 14:54    Assessment & Plan:   Linda Stewart was seen today for hypothyroidism and headache.  Diagnoses and all orders for this visit:  Migraine with aura and without status migrainosus, not intractable- Her headaches are episodic but it sounds like they are severe.  I recommended that she treat this with a CGRP antagonist as needed. -     Ubrogepant (UBRELVY) 100 MG TABS; Take 1 tablet by mouth daily as needed.  Postablative hypothyroidism- Her TSH is in the normal range.  She will remain on the current dose of levothyroxine. -     Cancel: TSH; Future -     TSH; Future   I  have discontinued Linda Stewart's ibuprofen. I am also having her start on Ubrelvy. Additionally, I am having her maintain her aspirin, Vitamin B-12, Vitamin D3, and levothyroxine.  Meds ordered this encounter  Medications  . Ubrogepant (UBRELVY) 100 MG TABS    Sig: Take 1 tablet by mouth daily as needed.    Dispense:  30 tablet    Refill:  1     Follow-up: Return in about 3 months (around 07/17/2020).  Scarlette Calico, MD

## 2020-04-23 ENCOUNTER — Ambulatory Visit: Payer: BC Managed Care – PPO | Admitting: Internal Medicine

## 2020-04-29 ENCOUNTER — Telehealth: Payer: Self-pay

## 2020-04-29 NOTE — Telephone Encounter (Signed)
Key: [id number]

## 2020-04-29 NOTE — Telephone Encounter (Signed)
New message    Checking on the status of prior authorization  Ubrogepant (UBRELVY) 100 MG TABS

## 2020-05-10 NOTE — Telephone Encounter (Signed)
LVM for pt to call back as soon as possible.   RE: Linda Stewart has been approved for 16 tablet every 30 days.

## 2020-05-10 NOTE — Telephone Encounter (Signed)
    Cyndee Brightly from [employer] calling Approval 1 year starting 05/02/20  Requesting to dispense 16/ 30 day suppy  Ref key : [facility] Phone [phone]

## 2020-10-03 DIAGNOSIS — N939 Abnormal uterine and vaginal bleeding, unspecified: Secondary | ICD-10-CM | POA: Insufficient documentation

## 2020-10-03 DIAGNOSIS — N920 Excessive and frequent menstruation with regular cycle: Secondary | ICD-10-CM | POA: Insufficient documentation

## 2020-10-15 ENCOUNTER — Other Ambulatory Visit: Payer: Self-pay | Admitting: Internal Medicine

## 2020-10-15 NOTE — Telephone Encounter (Signed)
Needs appt

## 2020-11-11 ENCOUNTER — Other Ambulatory Visit: Payer: Self-pay

## 2020-11-12 ENCOUNTER — Ambulatory Visit (INDEPENDENT_AMBULATORY_CARE_PROVIDER_SITE_OTHER): Payer: BC Managed Care – PPO | Admitting: Internal Medicine

## 2020-11-12 ENCOUNTER — Encounter: Payer: Self-pay | Admitting: Internal Medicine

## 2020-11-12 DIAGNOSIS — E538 Deficiency of other specified B group vitamins: Secondary | ICD-10-CM

## 2020-11-12 DIAGNOSIS — Z0000 Encounter for general adult medical examination without abnormal findings: Secondary | ICD-10-CM

## 2020-11-12 DIAGNOSIS — E559 Vitamin D deficiency, unspecified: Secondary | ICD-10-CM

## 2020-11-12 DIAGNOSIS — G43109 Migraine with aura, not intractable, without status migrainosus: Secondary | ICD-10-CM | POA: Diagnosis not present

## 2020-11-12 DIAGNOSIS — N760 Acute vaginitis: Secondary | ICD-10-CM

## 2020-11-12 MED ORDER — FLUCONAZOLE 150 MG PO TABS
150.0000 mg | ORAL_TABLET | Freq: Once | ORAL | 1 refills | Status: AC
Start: 2020-11-12 — End: 2020-11-12

## 2020-11-12 MED ORDER — METRONIDAZOLE 500 MG PO TABS
500.0000 mg | ORAL_TABLET | Freq: Three times a day (TID) | ORAL | 0 refills | Status: DC
Start: 2020-11-12 — End: 2022-10-28

## 2020-11-12 NOTE — Assessment & Plan Note (Signed)

## 2020-11-12 NOTE — Progress Notes (Signed)
Subjective:  Patient ID: Linda Stewart, female    DOB: 1971/06/02  Age: 50 y.o. MRN: [id number]  CC: No chief complaint on file.   HPI Linda Stewart presents for a well exam  Outpatient Medications Prior to Visit  Medication Sig Dispense Refill  . aspirin 325 MG EC tablet Take 325 mg by mouth daily.    . Cholecalciferol (VITAMIN D3) 2000 units TABS Take 2,000 Units by mouth daily.     . Cyanocobalamin (VITAMIN B-12) 500 MCG SUBL Place 1 tablet (500 mcg total) under the tongue 1 day or 1 dose. 100 tablet 3  . EUTHYROX 125 MCG tablet Take 1 tablet by mouth once daily 90 tablet 0  . Ubrogepant (UBRELVY) 100 MG TABS Take 1 tablet by mouth daily as needed. 30 tablet 1   No facility-administered medications prior to visit.    ROS: Review of Systems  Constitutional: Negative for activity change, appetite change, chills, fatigue and unexpected weight change.  HENT: Negative for congestion, mouth sores and sinus pressure.   Eyes: Negative for visual disturbance.  Respiratory: Negative for cough and chest tightness.   Gastrointestinal: Negative for abdominal pain and nausea.  Genitourinary: Negative for difficulty urinating, frequency and vaginal pain.  Musculoskeletal: Negative for back pain and gait problem.  Skin: Negative for pallor and rash.  Neurological: Negative for dizziness, tremors, weakness, numbness and headaches.  Psychiatric/Behavioral: Negative for confusion and sleep disturbance.    Objective:  BP 134/80 (BP Location: Right Arm, Patient Position: Sitting, Cuff Size: Normal)   Pulse 84   Temp 98.5 F (36.9 C) (Oral)   Ht 5\' 3"  (1.6 m)   Wt 155 lb (70.3 kg)   SpO2 97%   BMI 27.46 kg/m   BP Readings from Last 3 Encounters:  11/12/20 134/80  04/16/20 120/74  04/12/20 134/86    Wt Readings from Last 3 Encounters:  11/12/20 155 lb (70.3 kg)  04/16/20 162 lb 4 oz (73.6 kg)  04/12/20 160 lb (72.6 kg)    Physical Exam Constitutional:      General: She  is not in acute distress.    Appearance: She is well-developed.  HENT:     Head: Normocephalic.     Right Ear: External ear normal.     Left Ear: External ear normal.     Nose: Nose normal.     Mouth/Throat:     Mouth: Oropharynx is clear and moist.  Eyes:     General:        Right eye: No discharge.        Left eye: No discharge.     Conjunctiva/sclera: Conjunctivae normal.     Pupils: Pupils are equal, round, and reactive to light.  Neck:     Thyroid: No thyromegaly.     Vascular: No JVD.     Trachea: No tracheal deviation.  Cardiovascular:     Rate and Rhythm: Normal rate and regular rhythm.     Heart sounds: Normal heart sounds.  Pulmonary:     Effort: No respiratory distress.     Breath sounds: No stridor. No wheezing.  Abdominal:     General: Bowel sounds are normal. There is no distension.     Palpations: Abdomen is soft. There is no mass.     Tenderness: There is no abdominal tenderness. There is no guarding or rebound.  Musculoskeletal:        General: No tenderness or edema.     Cervical back: Normal range  of motion and neck supple.  Lymphadenopathy:     Cervical: No cervical adenopathy.  Skin:    Findings: No erythema or rash.  Neurological:     Cranial Nerves: No cranial nerve deficit.     Motor: No abnormal muscle tone.     Coordination: Coordination normal.     Deep Tendon Reflexes: Reflexes normal.  Psychiatric:        Mood and Affect: Mood and affect normal.        Behavior: Behavior normal.        Thought Content: Thought content normal.        Judgment: Judgment normal.     Lab Results  Component Value Date   WBC 3.9 (L) 04/12/2020   HGB 12.9 04/12/2020   HCT 38.0 04/12/2020   PLT 285 04/12/2020   GLUCOSE 96 04/12/2020   CHOL 204 (H) 09/07/2019   TRIG 116.0 09/07/2019   HDL 58.90 09/07/2019   LDLCALC 122 (H) 09/07/2019   ALT 17 04/12/2020   AST 16 04/12/2020   NA 141 04/12/2020   K 4.0 04/12/2020   CL 102 04/12/2020   CREATININE 0.70  04/12/2020   BUN 17 04/12/2020   CO2 25 04/12/2020   TSH 1.08 04/16/2020   INR 1.0 04/12/2020    MR ANGIO HEAD WO CONTRAST  Result Date: 04/12/2020 CLINICAL DATA:  Head and neck pressure and heaviness, history of treated aneurysm EXAM: MRI HEAD WITHOUT CONTRAST MRA HEAD WITHOUT CONTRAST TECHNIQUE: Multiplanar, multiecho pulse sequences of the brain and surrounding structures were obtained without intravenous contrast. Angiographic images of the head were obtained using MRA technique without contrast. COMPARISON:  2019 FINDINGS: MRI HEAD Brain: There is no acute infarction or intracranial hemorrhage. There is no intracranial mass, mass effect, or edema. There is no hydrocephalus or extra-axial fluid collection. Ventricles and sulci are normal in size and configuration. Minimal small foci of T2 hyperintensity in the supratentorial white matter likely reflect nonspecific gliosis/demyelination of doubtful clinical significance. Vascular: Major vessel flow voids at the skull base are preserved. Skull and upper cervical spine: Normal marrow signal is preserved. Sinuses/Orbits: Paranasal sinuses are aerated. Orbits are unremarkable. Other: Sella is unremarkable.  Mastoid air cells are clear. MRA HEAD Intracranial internal carotid arteries are patent. There is diminished flow related enhancement of cavernous and proximal supraclinoid left ICA secondary to susceptibility artifact from stent placement. Tiny neck remnant previously seen at the base of the treated left superior hypophyseal aneurysm is again seen though somewhat less apparent on this study. Middle and anterior cerebral arteries are patent. Intracranial vertebral arteries, basilar artery, posterior cerebral arteries are patent. Bilateral posterior communicating arteries are present. There is no significant stenosis or new aneurysm. IMPRESSION: No acute intracranial abnormality. 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 Electronically Signed   By: Macy Mis M.D.   On: 04/12/2020 14:54   MR BRAIN WO CONTRAST  Result Date: 04/12/2020 CLINICAL DATA:  Head and neck pressure and heaviness, history of treated aneurysm EXAM: MRI HEAD WITHOUT CONTRAST MRA HEAD WITHOUT CONTRAST TECHNIQUE: Multiplanar, multiecho pulse sequences of the brain and surrounding structures were obtained without intravenous contrast. Angiographic images of the head were obtained using MRA technique without contrast. COMPARISON:  2019 FINDINGS: MRI HEAD Brain: There is no acute infarction or intracranial hemorrhage. There is no intracranial mass, mass effect, or edema. There is no hydrocephalus or extra-axial fluid collection. Ventricles and sulci are normal in size and configuration. Minimal  small foci of T2 hyperintensity in the supratentorial white matter likely reflect nonspecific gliosis/demyelination of doubtful clinical significance. Vascular: Major vessel flow voids at the skull base are preserved. Skull and upper cervical spine: Normal marrow signal is preserved. Sinuses/Orbits: Paranasal sinuses are aerated. Orbits are unremarkable. Other: Sella is unremarkable.  Mastoid air cells are clear. MRA HEAD Intracranial internal carotid arteries are patent. There is diminished flow related enhancement of cavernous and proximal supraclinoid left ICA secondary to susceptibility artifact from stent placement. Tiny neck remnant previously seen at the base of the treated left superior hypophyseal aneurysm is again seen though somewhat less apparent on this study. Middle and anterior cerebral arteries are patent. Intracranial vertebral arteries, basilar artery, posterior cerebral arteries are patent. Bilateral posterior communicating arteries are present. There is no significant stenosis or new aneurysm. IMPRESSION: No acute intracranial abnormality. Treated left ICA superior hypophyseal region aneurysm with stable to decreased tiny neck  remnant. Electronically Signed   By: Macy Mis M.D.   On: 04/12/2020 14:54    Assessment & Plan:   There are no diagnoses linked to this encounter.   No orders of the defined types were placed in this encounter.    Follow-up: No follow-ups on file.  Walker Kehr, MD

## 2020-11-12 NOTE — Patient Instructions (Addendum)
For a mild COVID-19 case you can take zinc 50 mg a day for 1 week, vitamin C 1000 mg daily for 1 week, vitamin D2 50,000 units weekly for 2 months (unless you are taking vitamin D daily already), Quercetin 500 mg twice a day for 1 week (if you can get it quick enough).  Maintain good oral hydration and take Tylenol for high fever.   You can buy COVID-19 express tests for home use at [facility] or [facility] for $9.99.   They are less expensive online, roughly $15 for 2 tests.  There is a link below: [email or web address]  You can order 4 free COVID-19 home tests via [employer]   Tylenol PM as needed

## 2020-11-12 NOTE — Assessment & Plan Note (Signed)
On Vit D

## 2020-11-12 NOTE — Assessment & Plan Note (Addendum)
On Ubrelvi Tylenol PM prn

## 2020-11-12 NOTE — Assessment & Plan Note (Signed)
On B12 

## 2020-11-18 ENCOUNTER — Other Ambulatory Visit (INDEPENDENT_AMBULATORY_CARE_PROVIDER_SITE_OTHER): Payer: BC Managed Care – PPO

## 2020-11-18 DIAGNOSIS — D508 Other iron deficiency anemias: Secondary | ICD-10-CM

## 2020-11-18 DIAGNOSIS — Z0000 Encounter for general adult medical examination without abnormal findings: Secondary | ICD-10-CM | POA: Diagnosis not present

## 2020-11-18 LAB — URINALYSIS
Bilirubin Urine: NEGATIVE
Hgb urine dipstick: NEGATIVE
Ketones, ur: NEGATIVE
Leukocytes,Ua: NEGATIVE
Nitrite: NEGATIVE
Specific Gravity, Urine: 1.02 (ref 1.000–1.030)
Total Protein, Urine: NEGATIVE
Urine Glucose: NEGATIVE
Urobilinogen, UA: 0.2 (ref 0.0–1.0)
pH: 5.5 (ref 5.0–8.0)

## 2020-11-18 LAB — LIPID PANEL
Cholesterol: 188 mg/dL (ref 0–200)
HDL: 60.3 mg/dL (ref >39.00)
LDL Cholesterol: 112 mg/dL — ABNORMAL HIGH (ref 0–99)
NonHDL: 127.77
Total CHOL/HDL Ratio: 3
Triglycerides: 78 mg/dL (ref 0.0–149.0)
VLDL: 15.6 mg/dL (ref 0.0–40.0)

## 2020-11-18 LAB — COMPREHENSIVE METABOLIC PANEL
ALT: 11 U/L (ref 0–35)
AST: 15 U/L (ref 0–37)
Albumin: 4 g/dL (ref 3.5–5.2)
Alkaline Phosphatase: 45 U/L (ref 39–117)
BUN: 16 mg/dL (ref 6–23)
CO2: 27 mEq/L (ref 19–32)
Calcium: 9 mg/dL (ref 8.4–10.5)
Chloride: 104 mEq/L (ref 96–112)
Creatinine, Ser: 0.76 mg/dL (ref 0.40–1.20)
GFR: 92.76 mL/min (ref >60.00)
Glucose, Bld: 95 mg/dL (ref 70–99)
Potassium: 3.9 mEq/L (ref 3.5–5.1)
Sodium: 138 mEq/L (ref 135–145)
Total Bilirubin: 0.3 mg/dL (ref 0.2–1.2)
Total Protein: 7.6 g/dL (ref 6.0–8.3)

## 2020-11-18 LAB — CBC WITH DIFFERENTIAL/PLATELET
Basophils Absolute: 0 10*3/uL (ref 0.0–0.1)
Basophils Relative: 1.5 % (ref 0.0–3.0)
Eosinophils Absolute: 0.1 10*3/uL (ref 0.0–0.7)
Eosinophils Relative: 3.5 % (ref 0.0–5.0)
HCT: 29.7 % — ABNORMAL LOW (ref 36.0–46.0)
Hemoglobin: 9.7 g/dL — ABNORMAL LOW (ref 12.0–15.0)
Lymphocytes Relative: 38.1 % (ref 12.0–46.0)
Lymphs Abs: 1.2 10*3/uL (ref 0.7–4.0)
MCHC: 32.5 g/dL (ref 30.0–36.0)
MCV: 71.7 fl — ABNORMAL LOW (ref 78.0–100.0)
Monocytes Absolute: 0.3 10*3/uL (ref 0.1–1.0)
Monocytes Relative: 8.6 % (ref 3.0–12.0)
Neutro Abs: 1.5 10*3/uL (ref 1.4–7.7)
Neutrophils Relative %: 48.3 % (ref 43.0–77.0)
Platelets: 306 10*3/uL (ref 150.0–400.0)
RBC: 4.14 Mil/uL (ref 3.87–5.11)
RDW: 16.5 % — ABNORMAL HIGH (ref 11.5–15.5)
WBC: 3.1 10*3/uL — ABNORMAL LOW (ref 4.0–10.5)

## 2020-11-18 LAB — TSH: TSH: 0.75 u[IU]/mL (ref 0.35–4.50)

## 2020-11-18 LAB — VITAMIN D 25 HYDROXY (VIT D DEFICIENCY, FRACTURES): VITD: 36.17 ng/mL (ref 30.00–100.00)

## 2020-11-18 LAB — VITAMIN B12: Vitamin B-12: 1000 pg/mL — ABNORMAL HIGH (ref 211–911)

## 2020-11-21 NOTE — Telephone Encounter (Signed)
FYI.Marland KitchenMarland KitchenSheneka from the lab called and stated they could not add on the Iron due to blood being old. Set pt msg via mychart letting her know to come have iron panel check.Marland KitchenJohny Chess

## 2020-11-22 ENCOUNTER — Other Ambulatory Visit (INDEPENDENT_AMBULATORY_CARE_PROVIDER_SITE_OTHER): Payer: BC Managed Care – PPO

## 2020-11-22 DIAGNOSIS — D508 Other iron deficiency anemias: Secondary | ICD-10-CM

## 2020-11-22 LAB — IRON: Iron: 68 ug/dL (ref 42–145)

## 2020-11-23 LAB — IRON AND TIBC
Iron Saturation: 14 % — ABNORMAL LOW (ref 15–55)
Iron: 63 ug/dL (ref 27–159)
Total Iron Binding Capacity: 436 ug/dL (ref 250–450)
UIBC: 373 ug/dL (ref 131–425)

## 2020-11-24 ENCOUNTER — Other Ambulatory Visit: Payer: Self-pay | Admitting: Internal Medicine

## 2020-11-24 MED ORDER — FERROUS SULFATE 325 (65 FE) MG PO TABS: 325.0000 mg | ORAL_TABLET | Freq: Every day | ORAL | 5 refills | Status: DC

## 2020-12-24 ENCOUNTER — Telehealth (HOSPITAL_COMMUNITY): Payer: Self-pay

## 2020-12-24 NOTE — Telephone Encounter (Signed)
Called to schedule f/u mra, no answer, left vm. AW 

## 2020-12-25 ENCOUNTER — Other Ambulatory Visit (HOSPITAL_COMMUNITY): Payer: Self-pay | Admitting: Interventional Radiology

## 2020-12-25 DIAGNOSIS — I729 Aneurysm of unspecified site: Secondary | ICD-10-CM

## 2021-01-06 ENCOUNTER — Other Ambulatory Visit: Payer: Self-pay

## 2021-01-06 ENCOUNTER — Ambulatory Visit (HOSPITAL_COMMUNITY)
Admission: RE | Admit: 2021-01-06 | Discharge: 2021-01-06 | Disposition: A | Discharge status: Home / Self Care | Payer: BC Managed Care – PPO | Source: Ambulatory Visit | Attending: Interventional Radiology | Admitting: Interventional Radiology

## 2021-01-06 DIAGNOSIS — I729 Aneurysm of unspecified site: Secondary | ICD-10-CM | POA: Diagnosis not present

## 2021-01-07 ENCOUNTER — Telehealth (HOSPITAL_COMMUNITY): Payer: Self-pay

## 2021-01-07 NOTE — Telephone Encounter (Signed)
Pt agreed to f/u in 1 year with mra head. AW

## 2021-01-17 ENCOUNTER — Other Ambulatory Visit: Payer: Self-pay | Admitting: Internal Medicine

## 2021-01-21 ENCOUNTER — Other Ambulatory Visit: Payer: Self-pay | Admitting: Internal Medicine

## 2021-01-27 ENCOUNTER — Other Ambulatory Visit: Payer: Self-pay | Admitting: Internal Medicine

## 2021-04-10 ENCOUNTER — Other Ambulatory Visit: Payer: Self-pay | Admitting: Internal Medicine

## 2021-04-10 MED ORDER — CEPHALEXIN 500 MG PO CAPS: 500.0000 mg | ORAL_CAPSULE | Freq: Four times a day (QID) | ORAL | 0 refills | Status: DC

## 2021-04-11 ENCOUNTER — Ambulatory Visit: Payer: BC Managed Care – PPO | Admitting: Internal Medicine

## 2021-05-23 ENCOUNTER — Other Ambulatory Visit: Payer: Self-pay | Admitting: Internal Medicine

## 2021-05-23 DIAGNOSIS — G43109 Migraine with aura, not intractable, without status migrainosus: Secondary | ICD-10-CM

## 2021-05-26 ENCOUNTER — Telehealth: Payer: Self-pay | Admitting: Internal Medicine

## 2021-05-26 DIAGNOSIS — G43109 Migraine with aura, not intractable, without status migrainosus: Secondary | ICD-10-CM

## 2021-05-26 NOTE — Telephone Encounter (Signed)
Pharmacy was unable to fax over PA request for UBRELVY 100 MG TABS.

## 2021-05-30 ENCOUNTER — Telehealth: Payer: Self-pay

## 2021-05-30 MED ORDER — UBRELVY 100 MG PO TABS: 1.0000 | ORAL_TABLET | Freq: Every day | ORAL | 2 refills | Status: AC | PRN

## 2021-05-30 NOTE — Telephone Encounter (Signed)
PA was submitted via cover-my-meds. PA was denied it states " The request was denied because: You have requested more than the maximum quantity allowed by your plan. Plan only allow 16 tablets per month.Marland KitchenJohny Chess   Resent rx for 16 tabs, and sent pt a msg via mychart w/ status.Marland KitchenJohny Chess

## 2021-05-30 NOTE — Telephone Encounter (Signed)
Key: [id number]

## 2021-05-30 NOTE — Telephone Encounter (Signed)
PA was denied

## 2021-05-31 NOTE — Telephone Encounter (Signed)
Are there covered alternatives? Thx

## 2021-06-02 NOTE — Telephone Encounter (Signed)
See previous msg plan will only cover 16 tablets per month. Pt was notified with information, and new rx was sent for 16 tabs.Marland KitchenJohny Chess

## 2021-06-03 NOTE — Telephone Encounter (Signed)
Duplicate.. see previous PA.med was denied because  requested more than the maximum quantity allowed by your plan. Current plan approved criteria cover up to 16 tablets per month of Ubrelvy'100mg'$ . New rx was sent for #16 & pt was aware.Marland KitchenChryl Heck

## 2021-06-20 ENCOUNTER — Encounter: Payer: Self-pay | Admitting: Internal Medicine

## 2021-06-20 ENCOUNTER — Other Ambulatory Visit: Payer: Self-pay

## 2021-06-20 ENCOUNTER — Ambulatory Visit (INDEPENDENT_AMBULATORY_CARE_PROVIDER_SITE_OTHER): Payer: BC Managed Care – PPO | Admitting: Internal Medicine

## 2021-06-20 VITALS — BP 110/70 | HR 94 | Ht 63.0 in | Wt 155.2 lb

## 2021-06-20 DIAGNOSIS — E890 Postprocedural hypothyroidism: Secondary | ICD-10-CM

## 2021-06-20 NOTE — Progress Notes (Signed)
Patient ID: Linda Stewart, female   DOB: October 07, 1970, 50 y.o.   MRN: [id number]   This visit occurred during the SARS-CoV-2 public health emergency.  Safety protocols were in place, including screening questions prior to the visit, additional usage of staff PPE, and extensive cleaning of exam room while observing appropriate contact time as indicated for disinfecting solutions.   HPI  Linda Stewart is a 50 y.o.-year-old female, returning for f/u for history of Graves ds, now with post ablative hypothyroidism. Last visit 1 year and 8 months ago (virtual).  Interim history: Patient describes that approximately 2 months ago she started to have hot flashes, anxiety, was also found to have a UTI and was on antibiotics.  She also had neck pain and headaches.  All symptoms resolved since then.  She had a 7 pound intentional weight loss since last visit.  Reviewed history: I saw the pt first in 2015, when she presented with thyrotoxicosis, but this was improving and I advised her to return for labs in 2 months, but did not intervene at that time. Pt. was lost for f/u.   She then presented to PCP in 2017 >> still thyrotoxic >> advised to return to see me.  She was started on Propranolol 10 mg 3x a day by PCP.  We were able to decrease this gradually and then stop  In 05/2018, we increased her methimazole from 5 mg twice a day to 10 mg twice a day.  Subsequent labs were improved.    In 09/2018, I suggested RAI treatment after she returned after long absence with very abnormal TFTs.  Thyroid uptake and scan (10/14/2018): Was consistent with Graves' disease: Elevated 4 hour (54.1%) and 24 hour (66.6) RAI uptake consistent with hyperthyroidism.  Normal thyroid scan   RAI treatment (11/04/2018). She felt well after the treatment, without symptoms other than mild fatigue.  She developed post ablative hypothyroidism and was started on levothyroxine in 12/2018.  We continued to increase the dose  afterwards, last dose change was 04/2019.  Pt is on levothyroxine 125 mcg daily, taken: - in am - fasting - ~1.5h from b'fast - no Ca, MVI, PPIs - + Fe at night - not on Biotin  Reviewed patient's TFTs: Lab Results  Component Value Date   TSH 0.75 11/18/2020   TSH 1.08 04/16/2020   TSH 2.35 09/07/2019   TSH 2.38 07/06/2019   TSH 8.43 (H) 04/20/2019   TSH 25.18 (H) 03/03/2019   TSH 15.75 (H) 12/26/2018   TSH <0.01 (L) 09/16/2018   TSH <0.01 (L) 08/29/2018   TSH <0.01 (L) 06/03/2017   FREET4 1.12 09/07/2019   FREET4 1.19 07/06/2019   FREET4 0.80 04/20/2019   FREET4 0.82 03/03/2019   FREET4 0.38 (L) 12/26/2018   FREET4 1.42 09/16/2018   FREET4 1.40 08/29/2018   FREET4 1.70 (H) 06/03/2017   FREET4 0.92 01/28/2017   FREET4 0.75 (L) 12/08/2016    Progress antibodies were elevated: Lab Results  Component Value Date   TSI 205 (H) 09/16/2018   TSI 630 (H) 09/07/2016   Pt denies: - feeling nodules in neck - hoarseness - dysphagia - choking - SOB with lying down  Pt does not have a FH of thyroid ds. No FH of thyroid cancer. No h/o radiation tx to head or neck except for RAI treatment.  No herbal supplements. No Biotin use anymre. No recent steroids use.   She also has a history of CVA - when in [place] (~50 y/o) -  she was told at that time that her thyroid was abnormal. She also has an aneurysm - previously coiled. She has ATIII deficiency, B12 deficiency.  ROS: + See HPI  I reviewed pt's medications, allergies, PMH, social hx, family hx, and changes were documented in the history of present illness. Otherwise, unchanged from my initial visit note.  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   History of cerebral aneurysm repair dx 10-18-2006 via MRI/MRA  5.35m x 4.211msaccular   11-16-2006  stent-assisted coiling LICA  intracranial superior hypophyseal region   History of CVA (cerebrovascular accident)    2003   History of ovarian cyst    Pelvic pain in female 12/05/2015   Uterine polyp 12/05/2015   Past Surgical History:  Procedure Laterality Date   HYSTEROSCOPY WITH D & C N/A 02/28/2016   Procedure: DILATATION AND CURETTAGE /DIAGNOSTIC HYSTEROSCOPY;  Surgeon: JoJanyth ContesMD;  [facility]: [facility]cology;  Laterality: N/A;   LAPAROSCOPIC OVARIAN CYSTECTOMY  2001   LAPAROSCOPY N/A 02/28/2016   Procedure: OPERATIVE LAPAROSCOPY; LYSIS OF ADHESIONS;  Surgeon: JoJanyth ContesMD;  [facility]: Gynecology;  Laterality: N/A;   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  11-16-2006   dr deveshwar   coiling  (general anesthesia)   Social History   Social History   Marital status: Married    Spouse name: N/A   Number of children: 2: 1544nd 1077/o in 2017   Occupational History   Office Support - daEnvironmental consultant  Social History Main Topics   Smoking status: Never Smoker   Smokeless tobacco: Never Used   Alcohol use No   Drug use: Tequila, beer - occasionally   Social History Narrative   [id number] Current Outpatient Medications on File Prior to Visit  Medication Sig Dispense Refill   aspirin 325 MG EC tablet Take 325 mg by mouth daily.     cephALEXin (KEFLEX) 500 MG capsule Take 1 capsule (500 mg total) by mouth 4 (four) times daily. 16 capsule 0   Cholecalciferol (VITAMIN D3) 2000 units TABS Take 2,000 Units by mouth daily.      Cyanocobalamin (VITAMIN B-12) 500 MCG SUBL Place 1 tablet (500 mcg total) under the tongue 1 day or 1 dose. 100 tablet 3   ferrous sulfate 325 (65 FE) MG tablet Take 1 tablet (325 mg total) by mouth daily. 30 tablet 5   levothyroxine (SYNTHROID) 125 MCG tablet Take 1 tablet by mouth once daily 90  tablet 1   metroNIDAZOLE (FLAGYL) 500 MG tablet Take 1 tablet (500 mg total) by mouth 3 (three) times daily. 21 tablet 0   Ubrogepant (UBRELVY) 100 MG TABS Take 1 tablet by mouth daily as needed. 16 tablet 2   No current facility-administered medications on file prior to visit.   No Known Allergies Family History  Problem Relation Age of Onset   Leukemia Mother    Cancer Mother 4052     leukemia   Lung cancer Father        SMOKER   Cancer Father [id number]     lung ca   Heart disease Other    Stroke Other    Alcohol abuse Other  Breast cancer Other    PE: BP 110/70 (BP Location: Left Arm, Patient Position: Sitting, Cuff Size: Normal)   Pulse 94   Ht '5\' 3"'$  (1.6 m)   Wt 155 lb 3.2 oz (70.4 kg)   SpO2 96%   BMI 27.49 kg/m  Wt Readings from Last 3 Encounters:  06/20/21 155 lb 3.2 oz (70.4 kg)  11/12/20 155 lb (70.3 kg)  04/16/20 162 lb 4 oz (73.6 kg)   Constitutional: Normal weight, in NAD Eyes: PERRLA, EOMI, no exophthalmos ENT: moist mucous membranes, no thyromegaly, no cervical lymphadenopathy Cardiovascular: Tachycardia, RR, No MRG Respiratory: CTA B Gastrointestinal: abdomen soft, NT, ND, BS+ Musculoskeletal: no deformities, strength intact in all 4 Skin: moist, warm, no rashes Neurological: no tremor with outstretched hands, DTR normal in all 4  ASSESSMENT: 1.  Post ablative hypothyroidism  PLAN:  1. Patient with history of Graves' disease, now status post RAI ablation in 10/2018, after which she rapidly developed hypothyroidism.  We initially started her on 75 mcg of levothyroxine but we then had to increase the dose. - latest thyroid labs reviewed with pt. >> normal: Lab Results  Component Value Date   TSH 0.75 11/18/2020  - she continues on LT4 125 mcg daily - pt feels good on this dose. She had unusual sxs few mo ago (see HPI) >> now all resolved. - we discussed about taking the thyroid hormone every day, with water, >30 minutes before breakfast, separated by  >4 hours from acid reflux medications, calcium, iron, multivitamins. Pt. is taking it correctly. - will check thyroid tests today: TSH and fT4 - If labs are abnormal, she will need to return for repeat TFTs in 1.5 months - OTW, I will see her back in a year  Needs refills. Component     Latest Ref Rng & Units 06/25/2021  TSH     0.35 - 5.50 uIU/mL 0.85  T4,Free(Direct)     0.60 - 1.60 ng/dL 0.99  Thyroid tests are at goal.  Philemon Kingdom, MD PhD [facility] Endocrinology

## 2021-06-20 NOTE — Patient Instructions (Signed)
Please continue levothyroxine 125 mcg daily.  Take the thyroid hormone every day, with water, at least 30 minutes before breakfast, separated by at least 4 hours from: - acid reflux medications - calcium - iron - multivitamins  Please stop at [facility].  Please come back for a follow-up appointment in 1 year.

## 2021-06-25 ENCOUNTER — Other Ambulatory Visit (INDEPENDENT_AMBULATORY_CARE_PROVIDER_SITE_OTHER): Payer: BC Managed Care – PPO

## 2021-06-25 DIAGNOSIS — E890 Postprocedural hypothyroidism: Secondary | ICD-10-CM | POA: Diagnosis not present

## 2021-06-25 LAB — TSH: TSH: 0.85 u[IU]/mL (ref 0.35–5.50)

## 2021-06-25 LAB — T4, FREE: Free T4: 0.99 ng/dL (ref 0.60–1.60)

## 2021-06-26 MED ORDER — LEVOTHYROXINE SODIUM 125 MCG PO TABS: 125.0000 ug | ORAL_TABLET | Freq: Every day | ORAL | 3 refills | Status: DC

## 2021-08-17 IMAGING — MR MR MRA HEAD W/O CM
3 series · 19 of 48 positions shown · non-contrast
Comparison: 1369

CLINICAL DATA: Head and neck pressure and heaviness, history of
treated aneurysm

EXAM:
MRI HEAD WITHOUT CONTRAST
MRA HEAD WITHOUT CONTRAST
TECHNIQUE: Multiplanar, multiecho pulse sequences of the brain and surrounding
structures were obtained without intravenous contrast. Angiographic
images of the head were obtained using MRA technique without
contrast.

[Series 2: ax (id) · axial · 1.0mm · 0.43mm/px · z∈[-65,+19]mm · 17 of 184 slices shown]
[im 1/184]
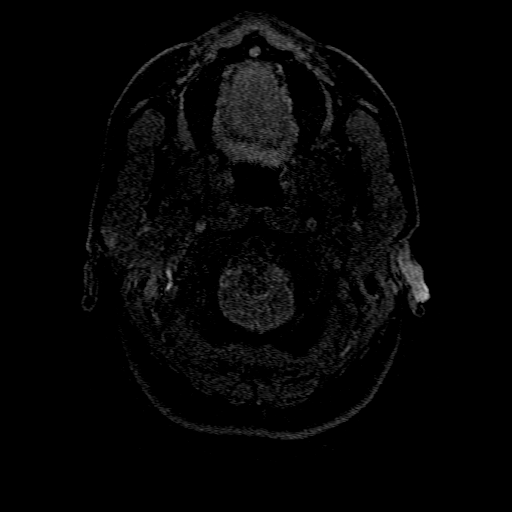
[im 5/184]
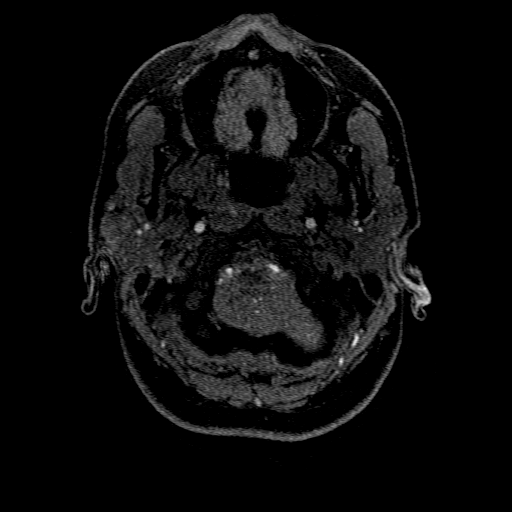
[im 9/184]
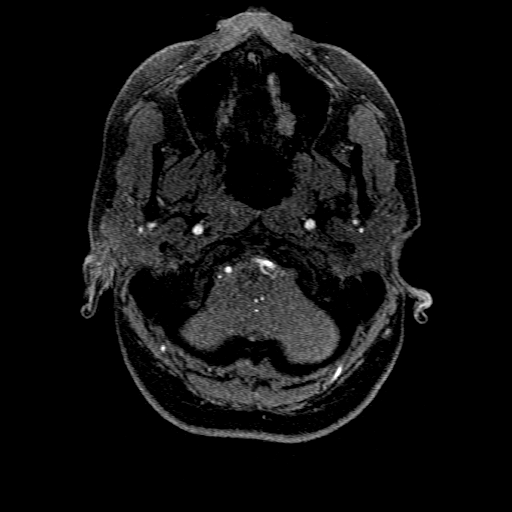
[im 13/184]
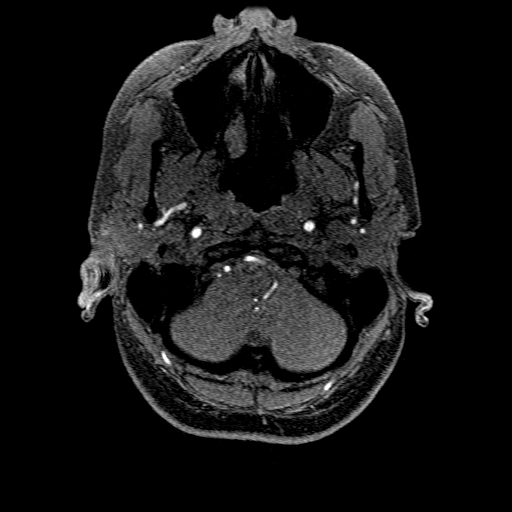
[im 17/184]
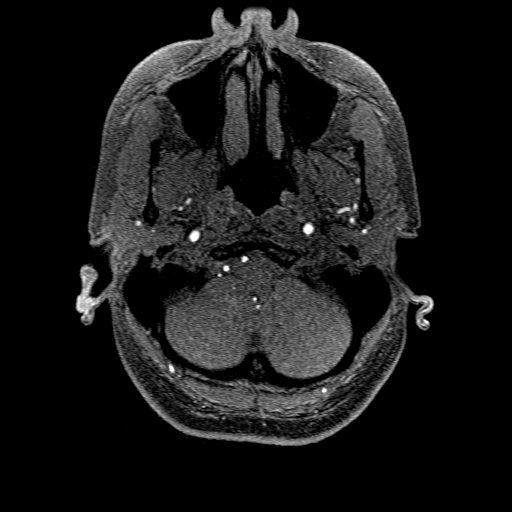
[im 21/184]
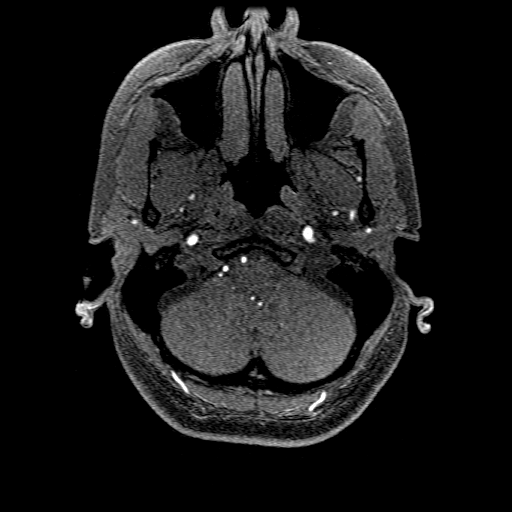
[im 25/184]
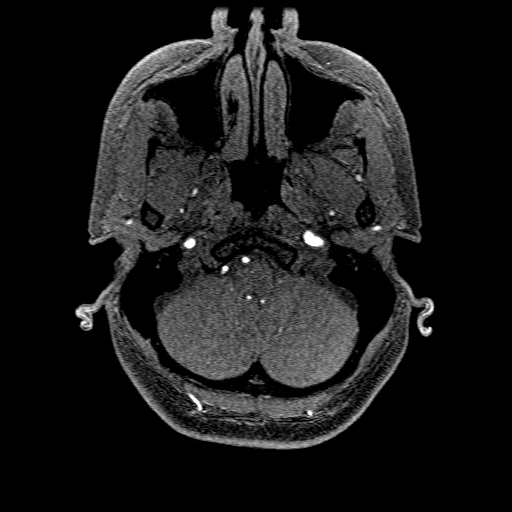
[im 29/184]
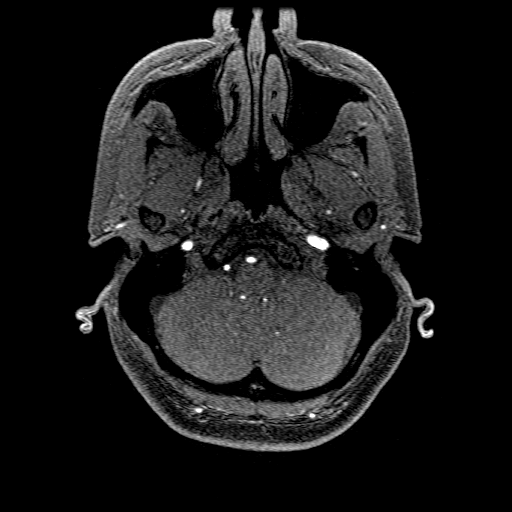
[im 33/184]
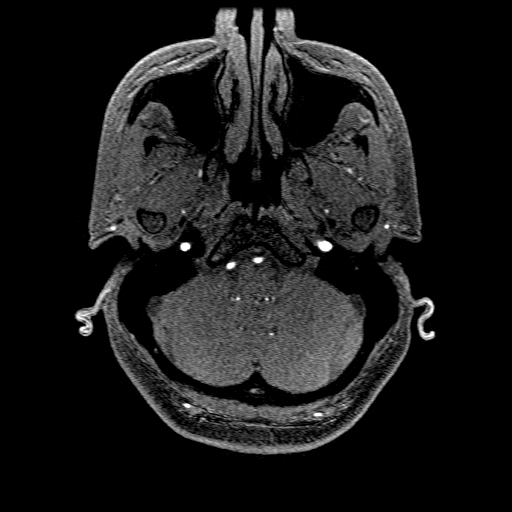
[im 57/184]
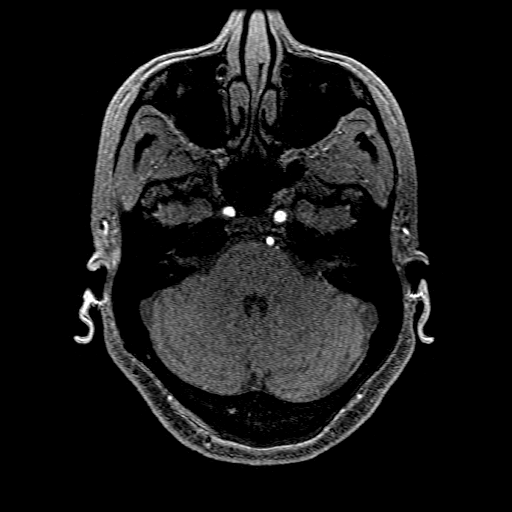
[im 82/184]
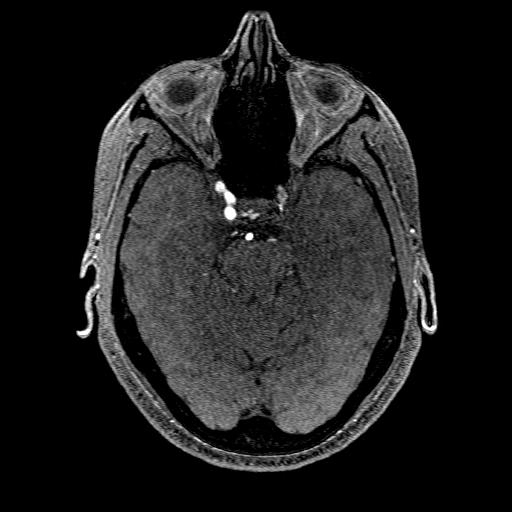
[im 94/184]
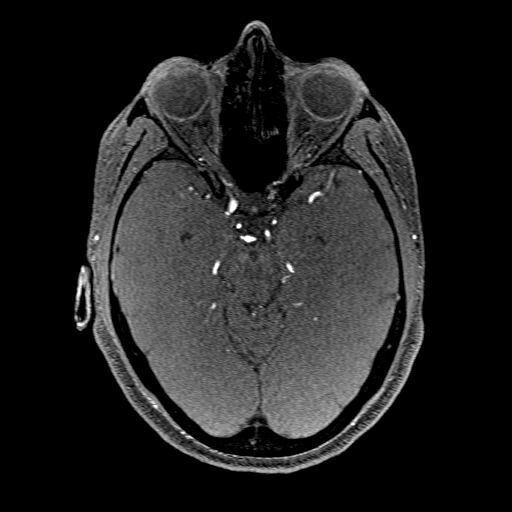
[im 102/184]
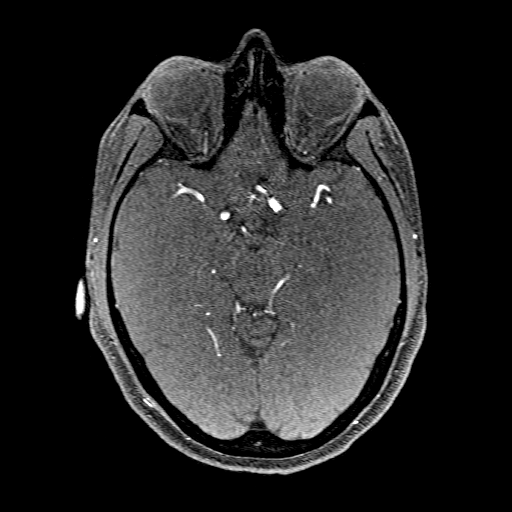
[im 127/184]
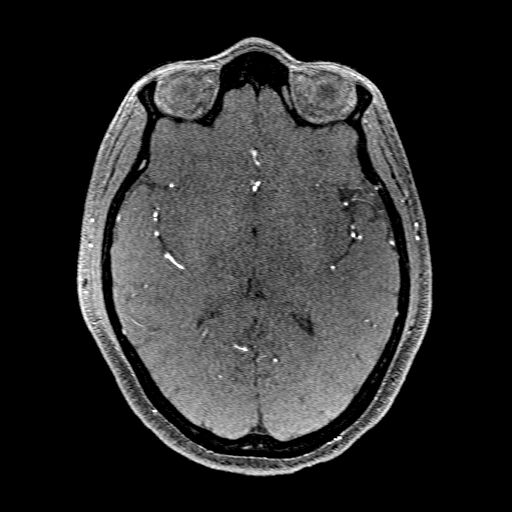
[im 151/184]
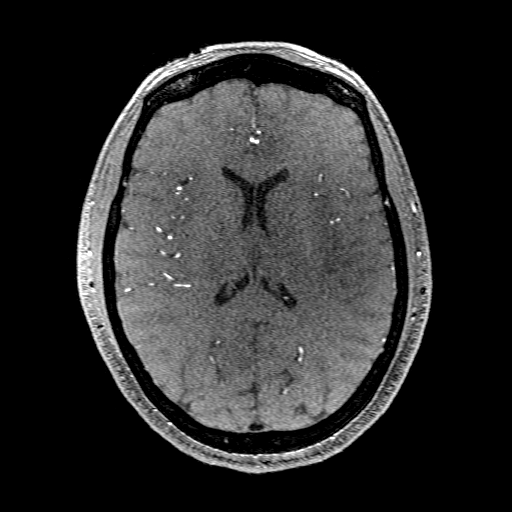
[im 155/184]
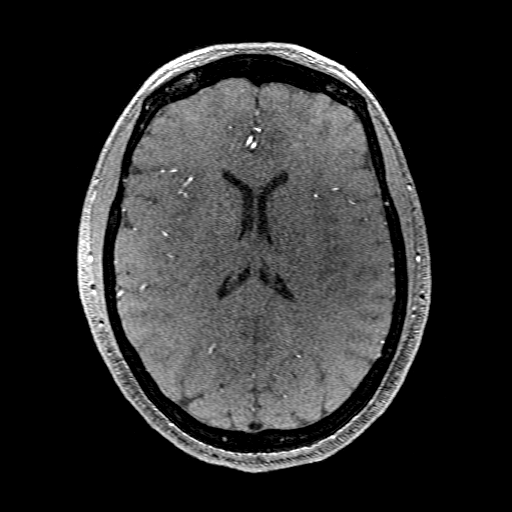
[im 175/184]
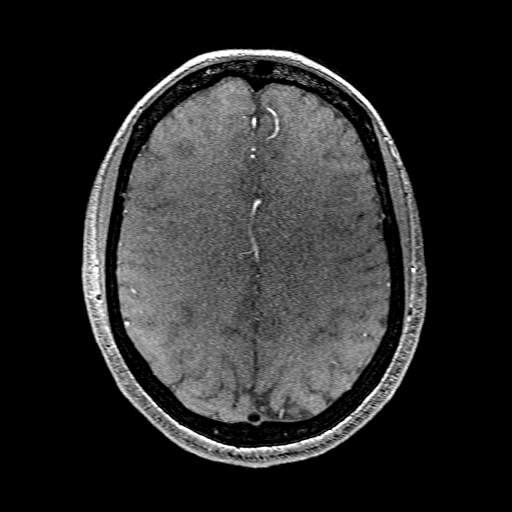

[Series 200: col:ax (id) · axial · 1.0mm · 0.43mm/px · 1 of 1 slices shown]
[im 1/1]
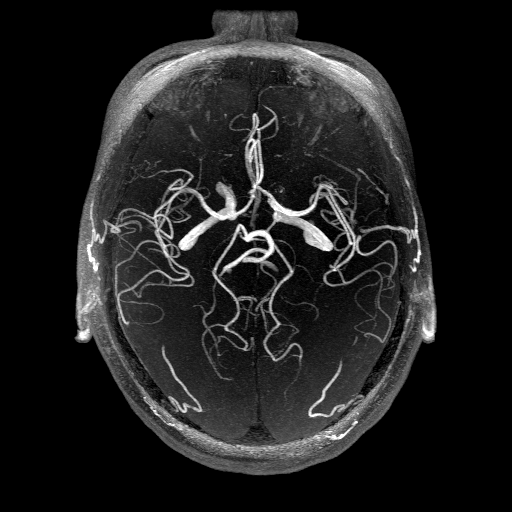

[Series 201: pjn:ax (id) · sagittal · 1.0mm · 0.43mm/px · 1 of 5 slices shown]
[im 1/5]
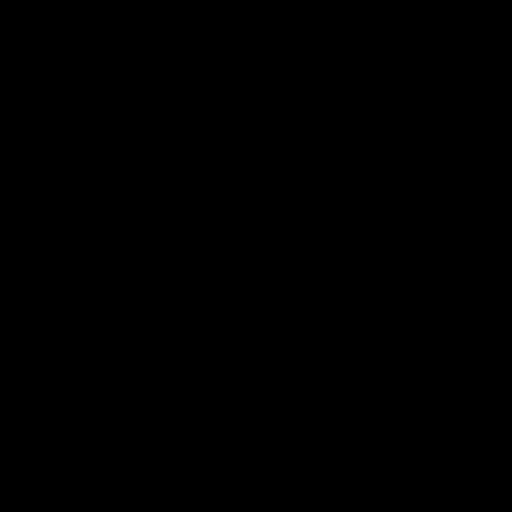

[19 of 48 positions shown; findings below may reference images not displayed]

FINDINGS: MRI HEAD

Brain: There is no acute infarction or intracranial hemorrhage.
There is no intracranial mass, mass effect, or edema. There is no
hydrocephalus or extra-axial fluid collection. Ventricles and sulci
are normal in size and configuration. Minimal small foci of T2
hyperintensity in the supratentorial white matter likely reflect
nonspecific gliosis/demyelination of doubtful clinical significance.

Vascular: Major vessel flow voids at the skull base are preserved.

Skull and upper cervical spine: Normal marrow signal is preserved.

Sinuses/Orbits: Paranasal sinuses are aerated. Orbits are
unremarkable.

Other: Sella is unremarkable.  Mastoid air cells are clear.

MRA HEAD

Intracranial internal carotid arteries are patent. There is
diminished flow related enhancement of cavernous and proximal
supraclinoid left ICA secondary to susceptibility artifact from
stent placement. Tiny neck remnant previously seen at the base of
the treated left superior hypophyseal aneurysm is again seen though
somewhat less apparent on this study.

Middle and anterior cerebral arteries are patent.

Intracranial vertebral arteries, basilar artery, posterior cerebral
arteries are patent. Bilateral posterior communicating arteries are
present. There is no significant stenosis or new aneurysm.
IMPRESSION: No acute intracranial abnormality.

Treated left ICA superior hypophyseal region aneurysm with stable to
decreased tiny neck remnant.

## 2021-12-08 ENCOUNTER — Encounter: Payer: Self-pay | Admitting: Internal Medicine

## 2021-12-08 ENCOUNTER — Ambulatory Visit (INDEPENDENT_AMBULATORY_CARE_PROVIDER_SITE_OTHER): Payer: BC Managed Care – PPO | Admitting: Internal Medicine

## 2021-12-08 ENCOUNTER — Other Ambulatory Visit: Payer: Self-pay

## 2021-12-08 VITALS — BP 122/80 | HR 60 | Temp 98.0°F | Ht 63.0 in | Wt 156.4 lb

## 2021-12-08 DIAGNOSIS — G43109 Migraine with aura, not intractable, without status migrainosus: Secondary | ICD-10-CM

## 2021-12-08 DIAGNOSIS — M545 Low back pain, unspecified: Secondary | ICD-10-CM

## 2021-12-08 DIAGNOSIS — E559 Vitamin D deficiency, unspecified: Secondary | ICD-10-CM

## 2021-12-08 DIAGNOSIS — D500 Iron deficiency anemia secondary to blood loss (chronic): Secondary | ICD-10-CM

## 2021-12-08 DIAGNOSIS — G8929 Other chronic pain: Secondary | ICD-10-CM

## 2021-12-08 DIAGNOSIS — E538 Deficiency of other specified B group vitamins: Secondary | ICD-10-CM

## 2021-12-08 DIAGNOSIS — M25561 Pain in right knee: Secondary | ICD-10-CM

## 2021-12-08 DIAGNOSIS — Z0000 Encounter for general adult medical examination without abnormal findings: Secondary | ICD-10-CM

## 2021-12-08 MED ORDER — MELOXICAM 15 MG PO TABS: 15.0000 mg | ORAL_TABLET | Freq: Every day | ORAL | 0 refills | Status: DC

## 2021-12-08 NOTE — Assessment & Plan Note (Signed)

## 2021-12-08 NOTE — Assessment & Plan Note (Addendum)
?  etiology R x 2 months

## 2021-12-08 NOTE — Assessment & Plan Note (Signed)
On B12 

## 2021-12-08 NOTE — Progress Notes (Unsigned)
Subjective:  Patient ID: Linda Stewart, female    DOB: Nov 23, 1970  Age: 51 y.o. MRN: [id number]  CC: Annual Exam   HPI Linda Stewart presents for a well exam  C/o pain behind R knee   F/u anemia  Outpatient Medications Prior to Visit  Medication Sig Dispense Refill   aspirin 325 MG EC tablet Take 325 mg by mouth daily.     cephALEXin (KEFLEX) 500 MG capsule Take 1 capsule (500 mg total) by mouth 4 (four) times daily. 16 capsule 0   Cholecalciferol (VITAMIN D3) 2000 units TABS Take 2,000 Units by mouth daily.      Cyanocobalamin (VITAMIN B-12) 500 MCG SUBL Place 1 tablet (500 mcg total) under the tongue 1 day or 1 dose. 100 tablet 3   ferrous sulfate 325 (65 FE) MG tablet Take 1 tablet (325 mg total) by mouth daily. 30 tablet 5   levothyroxine (SYNTHROID) 125 MCG tablet Take 1 tablet (125 mcg total) by mouth daily. 90 tablet 3   metroNIDAZOLE (FLAGYL) 500 MG tablet Take 1 tablet (500 mg total) by mouth 3 (three) times daily. 21 tablet 0   Ubrogepant (UBRELVY) 100 MG TABS Take 1 tablet by mouth daily as needed. 16 tablet 2   No facility-administered medications prior to visit.    ROS: Review of Systems  Constitutional:  Negative for activity change, appetite change, chills, fatigue and unexpected weight change.  HENT:  Negative for congestion, mouth sores and sinus pressure.   Eyes:  Negative for visual disturbance.  Respiratory:  Negative for cough and chest tightness.   Gastrointestinal:  Negative for abdominal pain and nausea.  Genitourinary:  Negative for difficulty urinating, frequency and vaginal pain.  Musculoskeletal:  Positive for arthralgias. Negative for back pain.  Skin:  Negative for pallor and rash.  Neurological:  Negative for dizziness, tremors, weakness, numbness and headaches.  Psychiatric/Behavioral:  Negative for confusion and sleep disturbance.    Objective:  BP 122/80 (BP Location: Left Arm, Patient Position: Sitting, Cuff Size: Normal)    Pulse 60     Temp 98 F (36.7 C) (Oral)    Ht '5\' 3"'$  (1.6 m)    Wt 156 lb 6.4 oz (70.9 kg)    LMP 12/05/2021    SpO2 98%    BMI 27.71 kg/m   BP Readings from Last 3 Encounters:  12/08/21 122/80  06/20/21 110/70  11/12/20 134/80    Wt Readings from Last 3 Encounters:  12/08/21 156 lb 6.4 oz (70.9 kg)  06/20/21 155 lb 3.2 oz (70.4 kg)  11/12/20 155 lb (70.3 kg)    Physical Exam Neurological:     Mental Status: She is oriented to person, place, and time.    Lab Results  Component Value Date   WBC 3.1 (L) 11/18/2020   HGB 9.7 (L) 11/18/2020   HCT 29.7 (L) 11/18/2020   PLT 306.0 11/18/2020   GLUCOSE 95 11/18/2020   CHOL 188 11/18/2020   TRIG 78.0 11/18/2020   HDL 60.30 11/18/2020   LDLCALC 112 (H) 11/18/2020   ALT 11 11/18/2020   AST 15 11/18/2020   NA 138 11/18/2020   K 3.9 11/18/2020   CL 104 11/18/2020   CREATININE 0.76 11/18/2020   BUN 16 11/18/2020   CO2 27 11/18/2020   TSH 0.85 06/25/2021   INR 1.0 04/12/2020    MR ANGIO HEAD WO CONTRAST  Result Date: 01/07/2021 CLINICAL DATA:  Follow-up examination for aneurysm. EXAM: MRA HEAD WITHOUT CONTRAST TECHNIQUE: Angiographic  images of the Circle of Willis were obtained using MRA technique without intravenous contrast. COMPARISON:  Previous MRA from 04/12/2020 as well as earlier studies. FINDINGS: ANTERIOR CIRCULATION: Visualized distal cervical segments of the internal carotid arteries remain widely patent with antegrade flow. Petrous segments widely patent. Diminished flow related signal within the cavernous and supraclinoid left ICA related to stent placement again seen. Probable tiny neck remnant at the level of the previously treated superior hypophyseal artery aneurysm again seen, not significantly changed as compared to multiple previous exams. No evidence for interval coil impaction or recanalization. Stable normal flow seen within the contralateral right carotid siphon. A1 segments, anterior communicating artery, and distal ACAs  well perfused and normal. M1 segments, MCA bifurcations, and distal MCA branches well perfused and normal in appearance. POSTERIOR CIRCULATION: Both V4 segments widely patent to the vertebrobasilar junction. Both PICA origins patent and normal. Basilar widely patent to its distal aspect. Superior cerebellar and posterior cerebral arteries widely patent and within normal limits. No new aneurysm or other vascular abnormality. IMPRESSION: 1. 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 2. Otherwise stable and normal intracranial MRA. Electronically Signed   By: Jeannine Boga M.D.   On: 01/07/2021 00:36    Assessment & Plan:   Problem List Items Addressed This Visit     B12 deficiency    On B12      Relevant Orders   Vitamin B12   Right knee pain    ?etiology R x 2 months      Relevant Orders   VAS [place] LOWER EXTREMITY VENOUS (DVT)   Vitamin D deficiency   Relevant Orders   VITAMIN D 25 Hydroxy (Vit-D Deficiency, Fractures)   Well adult exam - Primary    We discussed age appropriate health related issues, including available/recomended screening tests and vaccinations. Labs were ordered to be later reviewed . All questions were answered. We discussed one or more of the following - seat belt use, use of sunscreen/sun exposure exercise, safe sex, fall risk reduction, second hand smoke exposure, firearm use and storage, seat belt use, a need for adhering to healthy diet and exercise. Labs were ordered.  All questions were answered.      Relevant Orders   TSH   Urinalysis   CBC with Differential/Platelet   Lipid panel   Comprehensive metabolic panel   Vitamin K16   VITAMIN D 25 Hydroxy (Vit-D Deficiency, Fractures)   Other Visit Diagnoses     Iron deficiency anemia due to chronic blood loss       Relevant Orders   Iron, TIBC and Ferritin Panel         Meds ordered this encounter  Medications   meloxicam (MOBIC) 15 MG  tablet    Sig: Take 1 tablet (15 mg total) by mouth daily.    Dispense:  30 tablet    Refill:  0      Follow-up: Return in about 6 months (around 06/10/2022) for a follow-up visit.  Walker Kehr, MD

## 2021-12-10 ENCOUNTER — Other Ambulatory Visit (INDEPENDENT_AMBULATORY_CARE_PROVIDER_SITE_OTHER): Payer: BC Managed Care – PPO

## 2021-12-10 DIAGNOSIS — E538 Deficiency of other specified B group vitamins: Secondary | ICD-10-CM

## 2021-12-10 DIAGNOSIS — Z0000 Encounter for general adult medical examination without abnormal findings: Secondary | ICD-10-CM

## 2021-12-10 DIAGNOSIS — D500 Iron deficiency anemia secondary to blood loss (chronic): Secondary | ICD-10-CM

## 2021-12-10 DIAGNOSIS — E559 Vitamin D deficiency, unspecified: Secondary | ICD-10-CM

## 2021-12-10 LAB — COMPREHENSIVE METABOLIC PANEL
ALT: 14 U/L (ref 0–35)
AST: 19 U/L (ref 0–37)
Albumin: 4.6 g/dL (ref 3.5–5.2)
Alkaline Phosphatase: 54 U/L (ref 39–117)
BUN: 19 mg/dL (ref 6–23)
CO2: 29 mEq/L (ref 19–32)
Calcium: 10 mg/dL (ref 8.4–10.5)
Chloride: 102 mEq/L (ref 96–112)
Creatinine, Ser: 0.77 mg/dL (ref 0.40–1.20)
GFR: 90.64 mL/min (ref >60.00)
Glucose, Bld: 89 mg/dL (ref 70–99)
Potassium: 4.1 mEq/L (ref 3.5–5.1)
Sodium: 140 mEq/L (ref 135–145)
Total Bilirubin: 0.3 mg/dL (ref 0.2–1.2)
Total Protein: 7.7 g/dL (ref 6.0–8.3)

## 2021-12-10 LAB — CBC WITH DIFFERENTIAL/PLATELET
Basophils Absolute: 0 10*3/uL (ref 0.0–0.1)
Basophils Relative: 1 % (ref 0.0–3.0)
Eosinophils Absolute: 0.2 10*3/uL (ref 0.0–0.7)
Eosinophils Relative: 7 % — ABNORMAL HIGH (ref 0.0–5.0)
HCT: 37.8 % (ref 36.0–46.0)
Hemoglobin: 12.3 g/dL (ref 12.0–15.0)
Lymphocytes Relative: 42.9 % (ref 12.0–46.0)
Lymphs Abs: 1.4 10*3/uL (ref 0.7–4.0)
MCHC: 32.6 g/dL (ref 30.0–36.0)
MCV: 82.2 fl (ref 78.0–100.0)
Monocytes Absolute: 0.2 10*3/uL (ref 0.1–1.0)
Monocytes Relative: 7.2 % (ref 3.0–12.0)
Neutro Abs: 1.4 10*3/uL (ref 1.4–7.7)
Neutrophils Relative %: 41.9 % — ABNORMAL LOW (ref 43.0–77.0)
Platelets: 279 10*3/uL (ref 150.0–400.0)
RBC: 4.59 Mil/uL (ref 3.87–5.11)
RDW: 13.7 % (ref 11.5–15.5)
WBC: 3.3 10*3/uL — ABNORMAL LOW (ref 4.0–10.5)

## 2021-12-10 LAB — URINALYSIS
Bilirubin Urine: NEGATIVE
Hgb urine dipstick: NEGATIVE
Ketones, ur: NEGATIVE
Leukocytes,Ua: NEGATIVE
Nitrite: NEGATIVE
Specific Gravity, Urine: 1.025 (ref 1.000–1.030)
Total Protein, Urine: NEGATIVE
Urine Glucose: NEGATIVE
Urobilinogen, UA: 0.2 (ref 0.0–1.0)
pH: 6 (ref 5.0–8.0)

## 2021-12-10 LAB — LIPID PANEL
Cholesterol: 216 mg/dL — ABNORMAL HIGH (ref 0–200)
HDL: 64.3 mg/dL (ref >39.00)
LDL Cholesterol: 128 mg/dL — ABNORMAL HIGH (ref 0–99)
NonHDL: 151.6
Total CHOL/HDL Ratio: 3
Triglycerides: 118 mg/dL (ref 0.0–149.0)
VLDL: 23.6 mg/dL (ref 0.0–40.0)

## 2021-12-10 LAB — VITAMIN B12: Vitamin B-12: >1504 pg/mL — ABNORMAL HIGH (ref 211–911)

## 2021-12-10 LAB — TSH: TSH: 3.85 u[IU]/mL (ref 0.35–5.50)

## 2021-12-10 LAB — VITAMIN D 25 HYDROXY (VIT D DEFICIENCY, FRACTURES): VITD: 59.42 ng/mL (ref 30.00–100.00)

## 2021-12-11 LAB — IRON,TIBC AND FERRITIN PANEL
%SAT: 21 % (calc) (ref 16–45)
Ferritin: 27 ng/mL (ref 16–232)
Iron: 82 ug/dL (ref 40–190)
TIBC: 395 mcg/dL (calc) (ref 250–450)

## 2021-12-16 NOTE — Assessment & Plan Note (Signed)
Musculoskeletal.  Likely work-related.  Discussed.  Meloxicam as needed ?

## 2021-12-16 NOTE — Assessment & Plan Note (Signed)
Tylenol as needed.  Meloxicam as needed ?

## 2021-12-23 ENCOUNTER — Telehealth: Payer: Self-pay

## 2021-12-23 ENCOUNTER — Other Ambulatory Visit: Payer: Self-pay

## 2021-12-23 ENCOUNTER — Ambulatory Visit (HOSPITAL_COMMUNITY)
Admission: RE | Admit: 2021-12-23 | Discharge: 2021-12-23 | Disposition: A | Discharge status: Home / Self Care | Payer: BC Managed Care – PPO | Source: Ambulatory Visit | Attending: Internal Medicine | Admitting: Internal Medicine

## 2021-12-23 DIAGNOSIS — M25561 Pain in right knee: Secondary | ICD-10-CM | POA: Diagnosis present

## 2021-12-23 DIAGNOSIS — G8929 Other chronic pain: Secondary | ICD-10-CM | POA: Diagnosis present

## 2021-12-23 NOTE — Telephone Encounter (Signed)
[facility] called to report that patient is negative for DVT, superficial thrombus, and baker's cysts. Results should now be in EPIC ?

## 2022-02-23 ENCOUNTER — Other Ambulatory Visit (HOSPITAL_COMMUNITY): Payer: Self-pay | Admitting: Interventional Radiology

## 2022-02-23 DIAGNOSIS — I729 Aneurysm of unspecified site: Secondary | ICD-10-CM

## 2022-03-13 ENCOUNTER — Ambulatory Visit (HOSPITAL_COMMUNITY)
Admission: RE | Admit: 2022-03-13 | Discharge: 2022-03-13 | Disposition: A | Discharge status: Home / Self Care | Payer: BC Managed Care – PPO | Source: Ambulatory Visit | Attending: Interventional Radiology | Admitting: Interventional Radiology

## 2022-03-13 DIAGNOSIS — I729 Aneurysm of unspecified site: Secondary | ICD-10-CM | POA: Diagnosis present

## 2022-03-16 ENCOUNTER — Telehealth (HOSPITAL_COMMUNITY): Payer: Self-pay

## 2022-03-16 NOTE — Telephone Encounter (Signed)
Pt agreed to f/u in 1 year with mrv. AW  

## 2022-06-26 ENCOUNTER — Ambulatory Visit (INDEPENDENT_AMBULATORY_CARE_PROVIDER_SITE_OTHER): Payer: BC Managed Care – PPO | Admitting: Internal Medicine

## 2022-06-26 ENCOUNTER — Encounter: Payer: Self-pay | Admitting: Internal Medicine

## 2022-06-26 VITALS — BP 144/82 | HR 60 | Ht 63.0 in | Wt 156.4 lb

## 2022-06-26 DIAGNOSIS — E890 Postprocedural hypothyroidism: Secondary | ICD-10-CM | POA: Diagnosis not present

## 2022-06-26 NOTE — Progress Notes (Unsigned)
Patient ID: Linda Stewart, female   DOB: 01-25-1971, 51 y.o.   MRN: [id number]   HPI  Linda Stewart is a 51 y.o.-year-old female, returning for f/u for history of Graves ds, now with post ablative hypothyroidism. Last visit 1 year ago.  Interim history: She has no symptoms at today's visit other than hot flushes - occasionally.  Her menstrual cycles have stopped. Before visit she lost 7 pounds intentionally; now weight is stable. She has upper back and neck pain.  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  Reviewed history: I saw the pt first in 2015, when she presented with thyrotoxicosis, but this was improving and I advised her to return for labs in 2 months, but did not intervene at that time. Pt. was lost for f/u.   She then presented to PCP in 2017 >> still thyrotoxic >> advised to return to see me.  She was started on Propranolol 10 mg 3x a day by PCP.  We were able to decrease this gradually and then stop  In 05/2018, we increased her methimazole from 5 mg twice a day to 10 mg twice a day.  Subsequent labs were improved.    In 09/2018, I suggested RAI treatment after she returned after long absence with very abnormal TFTs.  Thyroid uptake and scan (10/14/2018): Was consistent with Graves' disease: Elevated 4 hour (54.1%) and 24 hour (66.6) RAI uptake consistent with hyperthyroidism.  Normal thyroid scan   RAI treatment (11/04/2018). She felt well after the treatment, without symptoms other than mild fatigue.  She developed post ablative hypothyroidism and was started on levothyroxine in 12/2018.  We continued to increase the dose afterwards, last dose change was 04/2019.  Pt is on levothyroxine 125 mcg daily, taken: - in am - fasting - ~1.5h from b'fast - no Ca, MVI, PPIs - stopped Fe at night 1 mo ago - not on Biotin  Reviewed patient's TFTs: Lab Results  Component Value Date   TSH 3.85 12/10/2021   TSH 0.85 06/25/2021   TSH 0.75 11/18/2020   TSH 1.08 04/16/2020   TSH 2.35  09/07/2019   TSH 2.38 07/06/2019   TSH 8.43 (H) 04/20/2019   TSH 25.18 (H) 03/03/2019   TSH 15.75 (H) 12/26/2018   TSH <0.01 (L) 09/16/2018   FREET4 0.99 06/25/2021   FREET4 1.12 09/07/2019   FREET4 1.19 07/06/2019   FREET4 0.80 04/20/2019   FREET4 0.82 03/03/2019   FREET4 0.38 (L) 12/26/2018   FREET4 1.42 09/16/2018   FREET4 1.40 08/29/2018   FREET4 1.70 (H) 06/03/2017   FREET4 0.92 01/28/2017    Progress antibodies were elevated: Lab Results  Component Value Date   TSI 205 (H) 09/16/2018   TSI 630 (H) 09/07/2016   Pt denies: - feeling nodules in neck - hoarseness - dysphagia - choking  Pt does not have a FH of thyroid ds. No FH of thyroid cancer. No h/o radiation tx to head or neck except for RAI treatment. No herbal supplements. No Biotin use anymre. No recent steroids use.   She also has a history of CVA - when in [place] (~51 y/o) - she was told at that time that her thyroid was abnormal. She also has an aneurysm - previously coiled. She has ATIII deficiency, B12 deficiency.  ROS: + See HPI  I reviewed pt's medications, allergies, PMH, social hx, family hx, and changes were documented in the history of present illness. Otherwise, unchanged from my initial visit note.  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   History of cerebral aneurysm repair dx 10-18-2006 via MRI/MRA  5.59m x 4.263msaccular   11-16-2006  stent-assisted coiling LICA intracranial superior hypophyseal region   History of CVA (cerebrovascular accident)    2003   History of ovarian cyst    Pelvic pain in female 12/05/2015   Uterine polyp 12/05/2015   Past Surgical History:  Procedure Laterality Date   HYSTEROSCOPY WITH D & C N/A 02/28/2016   Procedure: DILATATION AND CURETTAGE /DIAGNOSTIC HYSTEROSCOPY;  Surgeon: JoJanyth ContesMD;  [place] [facility];  Laterality: N/A;   LAPAROSCOPIC OVARIAN CYSTECTOMY  2001   LAPAROSCOPY N/A 02/28/2016   Procedure: OPERATIVE LAPAROSCOPY; LYSIS OF ADHESIONS;  Surgeon: JoJanyth ContesMD;  [place] [facility];  Laterality: N/A;   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  11-16-2006   dr deveshwar   coiling  (general anesthesia)   Social History   Social History   Marital status: Married    Spouse name: N/A   Number of children: 2: 1556nd 1052/o in 2017   Occupational History   Office Support - daEnvironmental consultant  Social History Main Topics   Smoking status: Never Smoker   Smokeless tobacco: Never Used   Alcohol use No   Drug use: Tequila, beer - occasionally   Social History Narrative   [id number] Current Outpatient Medications on File Prior to Visit  Medication Sig Dispense Refill   aspirin 325 MG EC tablet Take 325 mg by mouth daily.     cephALEXin (KEFLEX) 500 MG capsule Take 1 capsule (500 mg total) by mouth 4 (four) times daily. 16 capsule 0   Cholecalciferol (VITAMIN D3) 2000 units TABS Take 2,000 Units by mouth daily.      Cyanocobalamin (VITAMIN B-12) 500 MCG SUBL Place 1 tablet (500 mcg total) under the tongue 1 day or 1 dose. 100 tablet 3   ferrous sulfate 325 (65 FE) MG tablet Take 1 tablet (325 mg total) by mouth daily. 30 tablet 5   levothyroxine (SYNTHROID) 125 MCG tablet Take 1 tablet (125 mcg total) by mouth daily. 90 tablet 3   meloxicam (MOBIC) 15 MG tablet Take 1 tablet (15 mg total) by mouth daily. 30 tablet 0   metroNIDAZOLE (FLAGYL) 500 MG tablet Take 1 tablet (500 mg total) by mouth 3 (three) times daily. 21 tablet 0   Ubrogepant (UBRELVY) 100 MG TABS Take 1 tablet by mouth daily as needed. 16 tablet 2   No current facility-administered medications on file prior to visit.   No Known  Allergies Family History  Problem Relation Age of Onset   Leukemia Mother    Cancer Mother [phone]     leukemia   Lung cancer Father        SMOKER   Cancer Father [phone]     lung ca   Heart disease Other    Stroke Other    Alcohol abuse Other    Breast cancer Other    PE: BP (!) 144/82 (BP Location: Left Arm, Patient Position: Sitting, Cuff Size: Normal)   Pulse 60   Ht '5\' 3"'$  (1.6 m)   Wt 156 lb 6.4 oz (70.9  kg)   SpO2 97%   BMI 27.71 kg/m  Wt Readings from Last 3 Encounters:  06/26/22 156 lb 6.4 oz (70.9 kg)  12/08/21 156 lb 6.4 oz (70.9 kg)  06/20/21 155 lb 3.2 oz (70.4 kg)   Constitutional: normal weight, in NAD Eyes:  EOMI, no exophthalmos ENT: no neck masses, no cervical lymphadenopathy Cardiovascular: RRR, No MRG Respiratory: CTA B Musculoskeletal: no deformities Skin:no rashes Neurological: no tremor with outstretched hands  ASSESSMENT: 1.  Post ablative hypothyroidism  PLAN:  1. Patient with history of Graves' disease, status post RAI ablation in 10/2018, after which she rapidly developed hypothyroidism.  We initially started her on 75 mcg of levothyroxine but we had to increase the dose since then - latest thyroid labs reviewed with pt. >> normal: Lab Results  Component Value Date   TSH 3.85 12/10/2021  - she continues on LT4 125 mcg daily - pt feels good on this dose. - we discussed about taking the thyroid hormone every day, with water, >30 minutes before breakfast, separated by >4 hours from acid reflux medications, calcium, iron, multivitamins. Pt. is taking it correctly. - will check thyroid tests today: TSH and fT4 - If labs are abnormal, she will need to return for repeat TFTs in 1.5 months - OTW, I will see her back in a year  Needs refills.  Philemon Kingdom, MD PhD [facility]

## 2022-06-26 NOTE — Patient Instructions (Signed)
Please continue levothyroxine 125 mcg daily  Take the thyroid hormone every day, with water, at least 30 minutes before breakfast, separated by at least 4 hours from: - acid reflux medications - calcium - iron - multivitamins  Please stop at the lab.  Please come back for a follow-up appointment in 1 year.  

## 2022-06-27 LAB — T4, FREE: Free T4: 1.6 ng/dL (ref 0.82–1.77)

## 2022-06-27 LAB — TSH: TSH: 1.76 u[IU]/mL (ref 0.450–4.500)

## 2022-06-29 MED ORDER — LEVOTHYROXINE SODIUM 125 MCG PO TABS: 125.0000 ug | ORAL_TABLET | Freq: Every day | ORAL | 3 refills | Status: DC

## 2022-08-18 ENCOUNTER — Encounter: Payer: Self-pay | Admitting: Internal Medicine

## 2022-08-24 ENCOUNTER — Encounter: Payer: Self-pay | Admitting: Internal Medicine

## 2022-08-24 ENCOUNTER — Ambulatory Visit: Payer: BC Managed Care – PPO | Admitting: Internal Medicine

## 2022-08-24 VITALS — BP 136/78 | HR 65 | Temp 98.3°F | Ht 63.0 in | Wt 156.0 lb

## 2022-08-24 DIAGNOSIS — N946 Dysmenorrhea, unspecified: Secondary | ICD-10-CM | POA: Insufficient documentation

## 2022-08-24 DIAGNOSIS — Q0700 Arnold-Chiari syndrome without spina bifida or hydrocephalus: Secondary | ICD-10-CM | POA: Insufficient documentation

## 2022-08-24 DIAGNOSIS — N959 Unspecified menopausal and perimenopausal disorder: Secondary | ICD-10-CM | POA: Insufficient documentation

## 2022-08-24 DIAGNOSIS — R87619 Unspecified abnormal cytological findings in specimens from cervix uteri: Secondary | ICD-10-CM | POA: Insufficient documentation

## 2022-08-24 DIAGNOSIS — F39 Unspecified mood [affective] disorder: Secondary | ICD-10-CM | POA: Insufficient documentation

## 2022-08-24 DIAGNOSIS — Z23 Encounter for immunization: Secondary | ICD-10-CM | POA: Diagnosis not present

## 2022-08-24 DIAGNOSIS — I729 Aneurysm of unspecified site: Secondary | ICD-10-CM | POA: Insufficient documentation

## 2022-08-24 MED ORDER — ESCITALOPRAM OXALATE 5 MG PO TABS: 5.0000 mg | ORAL_TABLET | Freq: Every day | ORAL | 5 refills | Status: DC

## 2022-08-24 MED ORDER — ALPRAZOLAM 0.25 MG PO TABS: 0.2500 mg | ORAL_TABLET | Freq: Two times a day (BID) | ORAL | 1 refills | Status: AC | PRN

## 2022-08-24 NOTE — Assessment & Plan Note (Signed)
Anger issues - likely hormonal Psychology ref Start Lexapro low dose Xanax prn

## 2022-08-24 NOTE — Progress Notes (Signed)
Subjective:  Patient ID: Linda Stewart, female    DOB: 09/15/1971  Age: 51 y.o. MRN: [id number]  CC: Referral   HPI Linda Stewart presents for getting upset easy  Outpatient Medications Prior to Visit  Medication Sig Dispense Refill   aspirin 325 MG EC tablet Take 325 mg by mouth daily.     Cholecalciferol (VITAMIN D3) 2000 units TABS Take 2,000 Units by mouth daily.      Cyanocobalamin (VITAMIN B-12) 500 MCG SUBL Place 1 tablet (500 mcg total) under the tongue 1 day or 1 dose. 100 tablet 3   levothyroxine (SYNTHROID) 125 MCG tablet Take 1 tablet (125 mcg total) by mouth daily. 90 tablet 3   Ubrogepant (UBRELVY) 100 MG TABS Take 1 tablet by mouth daily as needed. 16 tablet 2   metroNIDAZOLE (FLAGYL) 500 MG tablet Take 1 tablet (500 mg total) by mouth 3 (three) times daily. (Patient not taking: Reported on 08/24/2022) 21 tablet 0   cephALEXin (KEFLEX) 500 MG capsule Take 1 capsule (500 mg total) by mouth 4 (four) times daily. (Patient not taking: Reported on 08/24/2022) 16 capsule 0   ferrous sulfate 325 (65 FE) MG tablet Take 1 tablet (325 mg total) by mouth daily. 30 tablet 5   meloxicam (MOBIC) 15 MG tablet Take 1 tablet (15 mg total) by mouth daily. (Patient not taking: Reported on 08/24/2022) 30 tablet 0   No facility-administered medications prior to visit.    ROS: Review of Systems  Constitutional:  Negative for activity change, appetite change, chills, fatigue and unexpected weight change.  HENT:  Negative for congestion, mouth sores and sinus pressure.   Eyes:  Negative for visual disturbance.  Respiratory:  Negative for cough and chest tightness.   Gastrointestinal:  Negative for abdominal pain and nausea.  Genitourinary:  Negative for difficulty urinating, frequency and vaginal pain.  Musculoskeletal:  Negative for back pain and gait problem.  Skin:  Negative for pallor and rash.  Neurological:  Negative for dizziness, tremors, weakness, numbness and headaches.   Psychiatric/Behavioral:  Negative for confusion, sleep disturbance and suicidal ideas. The patient is nervous/anxious.     Objective:  BP 136/78 (BP Location: Left Arm, Patient Position: Sitting, Cuff Size: Normal)   Pulse 65   Temp 98.3 F (36.8 C) (Oral)   Ht '5\' 3"'$  (1.6 m)   Wt 156 lb (70.8 kg)   SpO2 100%   BMI 27.63 kg/m   BP Readings from Last 3 Encounters:  08/24/22 136/78  06/26/22 (!) 144/82  12/08/21 122/80    Wt Readings from Last 3 Encounters:  08/24/22 156 lb (70.8 kg)  06/26/22 156 lb 6.4 oz (70.9 kg)  12/08/21 156 lb 6.4 oz (70.9 kg)    Physical Exam Constitutional:      General: She is not in acute distress.    Appearance: Normal appearance. She is well-developed.  HENT:     Head: Normocephalic.     Right Ear: External ear normal.     Left Ear: External ear normal.     Nose: Nose normal.  Eyes:     General:        Right eye: No discharge.        Left eye: No discharge.     Conjunctiva/sclera: Conjunctivae normal.     Pupils: Pupils are equal, round, and reactive to light.  Neck:     Thyroid: No thyromegaly.     Vascular: No JVD.     Trachea: No tracheal deviation.  Cardiovascular:     Rate and Rhythm: Normal rate and regular rhythm.     Heart sounds: Normal heart sounds.  Pulmonary:     Effort: No respiratory distress.     Breath sounds: No stridor. No wheezing.  Abdominal:     General: Bowel sounds are normal. There is no distension.     Palpations: Abdomen is soft. There is no mass.     Tenderness: There is no abdominal tenderness. There is no guarding or rebound.  Musculoskeletal:        General: No tenderness.     Cervical back: Normal range of motion and neck supple. No rigidity.  Lymphadenopathy:     Cervical: No cervical adenopathy.  Skin:    Findings: No erythema or rash.  Neurological:     Cranial Nerves: No cranial nerve deficit.     Motor: No abnormal muscle tone.     Coordination: Coordination normal.     Deep Tendon  Reflexes: Reflexes normal.  Psychiatric:        Behavior: Behavior normal.        Thought Content: Thought content normal.        Judgment: Judgment normal.   tearful  Lab Results  Component Value Date   WBC 3.3 (L) 12/10/2021   HGB 12.3 12/10/2021   HCT 37.8 12/10/2021   PLT 279.0 12/10/2021   GLUCOSE 89 12/10/2021   CHOL 216 (H) 12/10/2021   TRIG 118.0 12/10/2021   HDL 64.30 12/10/2021   LDLCALC 128 (H) 12/10/2021   ALT 14 12/10/2021   AST 19 12/10/2021   NA 140 12/10/2021   K 4.1 12/10/2021   CL 102 12/10/2021   CREATININE 0.77 12/10/2021   BUN 19 12/10/2021   CO2 29 12/10/2021   TSH 1.760 06/26/2022   INR 1.0 04/12/2020    MR ANGIO HEAD WO CONTRAST  Result Date: 03/16/2022 CLINICAL DATA:  Provided history: Aneurysm. EXAM: MRA HEAD WITHOUT CONTRAST TECHNIQUE: Angiographic images of the Circle of Willis were acquired using MRA technique without intravenous contrast. COMPARISON:  Prior MRA head examinations 01/06/2021 and earlier. FINDINGS: Anterior circulation: The intracranial internal carotid arteries are patent. As before, stent artifact limits evaluation for stenoses within the cavernous/paraclinoid left ICA. The M1 middle cerebral arteries are patent. No M2 proximal branch occlusion or high-grade proximal stenosis. The anterior cerebral arteries are patent. 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 No new intracranial aneurysm is identified. Posterior circulation: The intracranial vertebral arteries are patent. The basilar artery is patent. The posterior cerebral arteries are patent. Posterior communicating arteries are present bilaterally. Anatomic variants: None significant. IMPRESSION: Unchanged appearance of the treated superior hypophyseal region left ICA aneurysm with unchanged small neck remnant. Electronically Signed   By: Kellie Simmering D.O.   On: 03/16/2022 09:35    Assessment & Plan:    Problem List Items Addressed This Visit     Mood disorder ([place]) - Primary    Anger issues - likely hormonal Psychology ref Start Lexapro low dose Xanax prn      Relevant Orders   Ambulatory referral to Psychology      Meds ordered this encounter  Medications   escitalopram (LEXAPRO) 5 MG tablet    Sig: Take 1 tablet (5 mg total) by mouth daily.    Dispense:  30 tablet    Refill:  5   ALPRAZolam (XANAX) 0.25 MG tablet    Sig: Take 1-2 tablets (0.25-0.5  mg total) by mouth 2 (two) times daily as needed for anxiety.    Dispense:  60 tablet    Refill:  1      Follow-up: Return in about 2 months (around 10/24/2022) for a follow-up visit.  Walker Kehr, MD

## 2022-08-24 NOTE — Addendum Note (Signed)
Addended by: Basil Dess on: 08/24/2022 04:31 PM   Modules accepted: Orders

## 2022-09-23 ENCOUNTER — Ambulatory Visit: Payer: BC Managed Care – PPO | Admitting: Psychology

## 2022-09-23 DIAGNOSIS — F4323 Adjustment disorder with mixed anxiety and depressed mood: Secondary | ICD-10-CM | POA: Diagnosis not present

## 2022-09-23 NOTE — Progress Notes (Signed)
[facility] Counselor Initial Adult Exam  Name: Linda Stewart Date: 09/23/2022 MRN: [id number] DOB: 1970-10-14 PCP: Cassandria Anger, MD  Time spent: 45 mins  Guardian/Payee:  Pt    Paperwork requested: No   Reason for Visit /Presenting Problem: Pt presented for initial session, via WebEx video.  Pt granted consent for session, stating she is in her car with no one else present.  I shared with pt that I am in my office with no one one else present here either.  Mental Status Exam: Appearance:   Casual     Behavior:  Appropriate  Motor:  Normal  Speech/Language:   Clear and Coherent  Affect:  Appropriate  Mood:  normal  Thought process:  normal  Thought content:    WNL  Sensory/Perceptual disturbances:    WNL  Orientation:  oriented to person, place, and time/date  Attention:  Good  Concentration:  Good  Memory:  WNL  Fund of knowledge:   Good  Insight:    Good  Judgment:   Good  Impulse Control:  Good   Reported Symptoms:  Pt shares she is seeking therapy at this time, with the support of her husband and oldest child, and because of a situation that happened to her recently.  She was planning her 51th birthday party with her friend.  Her friend seemed stressed during the birthday celebration.  "At one point I kind of exploded at her; I got angry at her and my husband thought I was wrong and I was embarrassed at what I had done during the party.  I apologized to my friend and we are fine now.  I still feel ashamed about my behavior.  I am not sure why I did it."  Pt shares, "I always feel defensive with others."  I have talked to my husband and told him he hurt my feelings when he was against me at my own party."  This was the second time she "had exploded at my friend."  Pt shares that she often argues with her oldest son (high functioning autistic).  "My dad got mad a lot when I was young; my mom was very sweet to [place].  My mom passed away when I was 51 yo; I  grew up in myt grandparents house with my mom and dad; I am the only child of my mom and dad; my mom had 4 children from a previous marriage.  My dad did remarry after my mom's death; I was a lot like Cinderella in that house."  Ultimately, she shared her feelings with her dad and they moved from that house for her benefit.    Risk Assessment: Danger to Self:  No Self-injurious Behavior: No Danger to Others: No Duty to Warn:no Physical Aggression / Violence:No  Access to Firearms a concern: No  Gang Involvement:No  Patient / guardian was educated about steps to take if suicide or homicide risk level increases between visits: n/a While future psychiatric events cannot be accurately predicted, the patient does not currently require acute inpatient psychiatric care and does not currently meet [facility] involuntary commitment criteria.  Substance Abuse History: Current substance abuse: No   social use of alcohol  Past Psychiatric History:   Previous psychological history is significant for depression Outpatient Providers:A couple of counselors as a child in [place] History of Psych Hospitalization: No  Psychological Testing:  none    Abuse History:  Victim of: No.,  none    Report needed:  No. Victim of Neglect:No. Perpetrator of  none   Witness / Exposure to Domestic Violence: No   Protective Services Involvement: No  Witness to [employer] Violence:  No   Family History:  Family History  Problem Relation Age of Onset   Leukemia Mother    Cancer Mother 76       leukemia   Lung cancer Father        SMOKER   Cancer Father 31       lung ca   Heart disease Other    Stroke Other    Alcohol abuse Other    Breast cancer Other     Living situation: the patient lives with their family; father passed away about 1 yrs ago  Sexual Orientation: Straight  Relationship Status: married in 1999 Name of spouse / other: Mateo Flow If a parent, number of children / ages: Garlon Hatchet (50 yo);  [place] (50 yo)  Support Systems: spouse Niece, brother-in-law  Museum/gallery curator Stress:  No   Income/Employment/Disability: 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; Mateo Flow works in the [place]: No   Educational History: Education: Forensic psychologist in [place]  Religion/Sprituality/World View: Purdin  Any cultural differences that may affect / interfere with treatment:  not applicable   Recreation/Hobbies: listening to music, cooking, cleaning, watching TV, art projects when she has time, dance, spending time with family  Stressors: Other: struggles dealing with emotional situations    Strengths: Supportive Relationships, Family, and Spirituality  Barriers:  none noted   Legal History: Pending legal issue / charges: The patient has no significant history of legal issues. 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   History of cerebral aneurysm repair dx 10-18-2006 via MRI/MRA  5.51m x 4.284msaccular   11-16-2006  stent-assisted coiling LICA intracranial superior hypophyseal region   History of CVA (cerebrovascular accident)    2003   History of ovarian cyst    Pelvic pain in female 12/05/2015   Uterine polyp 12/05/2015    Past Surgical History:  Procedure Laterality Date   HYSTEROSCOPY WITH D & C N/A 02/28/2016   Procedure: DILATATION AND CURETTAGE /DIAGNOSTIC HYSTEROSCOPY;  Surgeon: JoJanyth ContesMD;  [facility]: [facility]cology;  Laterality: N/A;   LAPAROSCOPIC OVARIAN CYSTECTOMY  2001   LAPAROSCOPY N/A 02/28/2016   Procedure: OPERATIVE LAPAROSCOPY; LYSIS OF ADHESIONS;  Surgeon: JoJanyth ContesMD;  [facility]cology;  Laterality: N/A;    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 60 tablet 1   aspirin 325 MG EC tablet Take 325 mg by mouth daily.     Cholecalciferol (VITAMIN D3) 2000 units TABS Take 2,000 Units by mouth daily.      Cyanocobalamin (VITAMIN B-12) 500 MCG SUBL Place 1 tablet (500 mcg total) under the tongue 1 day or 1 dose. 100 tablet 3   escitalopram (LEXAPRO) 5 MG tablet Take 1 tablet (5 mg total) by mouth daily.  30 tablet 5   levothyroxine (SYNTHROID) 125 MCG tablet Take 1 tablet (125 mcg total) by mouth daily. 90 tablet 3   metroNIDAZOLE (FLAGYL) 500 MG tablet Take 1 tablet (500 mg total) by mouth 3 (three) times daily. (Patient not taking: Reported on 08/24/2022) 21 tablet 0   Ubrogepant (UBRELVY) 100 MG TABS Take 1 tablet by mouth daily as needed. 16 tablet 2   No current facility-administered medications for this visit.    No Known Allergies  Diagnoses:  Adjustment disorder with mixed anxiety and depressed mood  Plan of Care: Encouraged pt to continue with her self care activities and we will meet in 2 wks for a follow up session (10/09/22).     Ivan Anchors, [facility]

## 2022-10-09 ENCOUNTER — Ambulatory Visit: Payer: BC Managed Care – PPO | Admitting: Psychology

## 2022-10-09 DIAGNOSIS — F4323 Adjustment disorder with mixed anxiety and depressed mood: Secondary | ICD-10-CM

## 2022-10-09 NOTE — Progress Notes (Signed)
[place] Counselor/Therapist Progress Note  Patient ID: Linda Stewart, MRN: [id number],    Date: 10/09/2022  Time Spent: 45 mins  Treatment Type: Individual Therapy  Reported Symptoms: Pt presents for session via WebEx video.  Pt grants consent for session, stating she is in her home with no one else present.  I shared with pt that I am in my office with no one else here either.  Mental Status Exam: Appearance:  Casual     Behavior: Appropriate  Motor: Normal  Speech/Language:  Clear and Coherent  Affect: Appropriate  Mood: normal  Thought process: normal  Thought content:   WNL  Sensory/Perceptual disturbances:   WNL  Orientation: oriented to person, place, and time/date  Attention: Good  Concentration: Good  Memory: WNL  Fund of knowledge:  Good  Insight:   Good  Judgment:  Good  Impulse Control: Good   Risk Assessment: Danger to Self:  No Self-injurious Behavior: No Danger to Others: No Duty to Warn:no Physical Aggression / Violence:No  Access to Firearms a concern: No  Gang Involvement:No   Subjective: Pt shares that she is taking her Lexapro '5mg'$  daily at bedtime and also has a prn prescription for Xanax for times when she gets upset or angry.  Pt shares that she is trying hard to change the way she interacts with others (kids, husband, friends, colleagues at work, Social research officer, government.) as well.  Pt shares her neck has been bothering her; soreness.  Encouraged her to take Ibuprofen and use a heating pad this weekend to see if that helps her neck pain.  She can also contact her doctor next week, if the discomfort persists.  Pt shares that her husband and her sons are doing well.  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  She has been at for [employer] 17 yrs.  Pt shares she has  not had any outbursts since our last session.  Asked pt to think about what situations in her life tend to cause her the most stress and we will talk about her list in our next session.  Pt shares that her 13 yo son Garlon Hatchet) is in the Art program at [employer] and is also working at [employer] on the weekends.  Encouraged pt to continue with her self care activities and we will meet in 3 wks for a follow up session.  Interventions: Cognitive Behavioral Therapy  Diagnosis:Adjustment disorder with mixed anxiety and depressed mood  Plan: Treatment Plan Strengths/Abilities:  Intelligent, Intuitive, Willing to participate in therapy Treatment Preferences:  Outpatient Individual Therapy Statement of Needs:  Patient is to use CBT, mindfulness and coping skills to help manage and/or decrease symptoms associated with their diagnosis. Symptoms:  Depressed/Irritable mood, worry, social withdrawal Problems Addressed:  Depressive thoughts, Sadness, Sleep issues, etc. Long Term Goals:  Pt to reduce overall level, frequency, and intensity of the feelings of depression/anxiety as evidenced by decreased irritability, negative self talk, and helpless feelings from 6 to 7 days/week to 0 to 1 days/week, per client report, for at least 3 consecutive months.  Progress: 20% Short Term Goals:  Pt to verbally express understanding of the relationship between feelings of depression/anxiety and their impact on thinking patterns and behaviors.  Pt to verbalize an understanding of the role  that distorted thinking plays in creating fears, excessive worry, and ruminations.  Progress: 20% Target Date:  10/10/2023 Frequency:  Bi-weekly Modality:  Cognitive Behavioral Therapy Interventions by Therapist:  Therapist will use CBT, Mindfulness exercises, Coping skills and Referrals, as needed by client. Client has verbally approved this treatment plan.  Ivan Anchors, [facility]

## 2022-10-28 ENCOUNTER — Ambulatory Visit: Payer: BC Managed Care – PPO | Admitting: Internal Medicine

## 2022-10-28 ENCOUNTER — Ambulatory Visit: Payer: BC Managed Care – PPO | Admitting: Psychology

## 2022-10-28 ENCOUNTER — Ambulatory Visit (INDEPENDENT_AMBULATORY_CARE_PROVIDER_SITE_OTHER): Payer: BC Managed Care – PPO

## 2022-10-28 ENCOUNTER — Encounter: Payer: Self-pay | Admitting: Internal Medicine

## 2022-10-28 VITALS — BP 110/84 | HR 57 | Temp 98.6°F | Ht 63.0 in | Wt 153.0 lb

## 2022-10-28 DIAGNOSIS — M542 Cervicalgia: Secondary | ICD-10-CM

## 2022-10-28 DIAGNOSIS — N959 Unspecified menopausal and perimenopausal disorder: Secondary | ICD-10-CM | POA: Diagnosis not present

## 2022-10-28 DIAGNOSIS — F4323 Adjustment disorder with mixed anxiety and depressed mood: Secondary | ICD-10-CM

## 2022-10-28 MED ORDER — CYCLOBENZAPRINE HCL 5 MG PO TABS: 5.0000 mg | ORAL_TABLET | Freq: Three times a day (TID) | ORAL | 1 refills | Status: AC | PRN

## 2022-10-28 MED ORDER — TRAMADOL HCL 50 MG PO TABS: 50.0000 mg | ORAL_TABLET | Freq: Four times a day (QID) | ORAL | 1 refills | Status: AC | PRN

## 2022-10-28 MED ORDER — METHYLPREDNISOLONE 4 MG PO TBPK: ORAL_TABLET | ORAL | 0 refills | Status: DC

## 2022-10-28 MED ORDER — VALACYCLOVIR HCL 1 G PO TABS: 1000.0000 mg | ORAL_TABLET | Freq: Two times a day (BID) | ORAL | 0 refills | Status: AC

## 2022-10-28 NOTE — Assessment & Plan Note (Signed)
Better on Lexapro 

## 2022-10-28 NOTE — Progress Notes (Signed)
[place] Counselor/Therapist Progress Note  Patient ID: CATHERENE KALETA, MRN: [id number],    Date: 10/28/2022  Time Spent: 45 mins  Treatment Type: Individual Therapy  Reported Symptoms: Pt presents for session via WebEx video.  Pt grants consent for session, stating she is in her home with no one else present.  I shared with pt that I am in my office with no one else here either.  Mental Status Exam: Appearance:  Casual     Behavior: Appropriate  Motor: Normal  Speech/Language:  Clear and Coherent  Affect: Appropriate  Mood: normal  Thought process: normal  Thought content:   WNL  Sensory/Perceptual disturbances:   WNL  Orientation: oriented to person, place, and time/date  Attention: Good  Concentration: Good  Memory: WNL  Fund of knowledge:  Good  Insight:   Good  Judgment:  Good  Impulse Control: Good   Risk Assessment: Danger to Self:  No Self-injurious Behavior: No Danger to Others: No Duty to Warn:no Physical Aggression / Violence:No  Access to Firearms a concern: No  Gang Involvement:No   Subjective: Pt shares that she "has been good since our last session.  Everything is going pretty well."  Pt shares she is still having her pain in her neck; she saw her PCP today and he gave her a muscle relaxer and sent her for an x-ray this afternoon too.  She hopes to have feedback within the next couple of days.  Pt shares that she is being intentional about not getting caught up in frustrations at work or at home.  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  He has now withdrawn from her and she asked him if everything was OK; he said it was fine.  She has decided to let it go as it will at this point.  Pt continues to take her Lexapro 5 mg per day and she believes it is beneficial for her to control her mood.  Pt has not encountered any circumstances that have required her  to take any of her Xanax yet.  Pt shares that her husband and her sons are doing well.  Pt shares she did have a conversation with her oldest son in which he talked back to her and she did not like that.  He is 51 yo and does not drive consistently; pt's husband is working with him on his driving.  Pt shares that her husband is very patient and is very supportive of pt.  Her husband is an Electrical engineer and works at the airport.  Pt enjoys cooking for her family, she also have been going out with her husband, etc.  Encouraged pt to continue with her self care activities and we will meet in 2 wks for a follow up session.  Interventions: Cognitive Behavioral Therapy  Diagnosis:Adjustment disorder with mixed anxiety and depressed mood  Plan: Treatment Plan Strengths/Abilities:  Intelligent, Intuitive, Willing to participate in therapy Treatment Preferences:  Outpatient Individual Therapy Statement of Needs:  Patient is to use CBT, mindfulness and coping skills to help manage and/or decrease symptoms associated with their diagnosis. Symptoms:  Depressed/Irritable mood, worry, social withdrawal Problems Addressed:  Depressive thoughts, Sadness, Sleep issues, etc. Long Term Goals:  Pt to reduce overall level, frequency, and intensity of the feelings of depression/anxiety as evidenced by decreased irritability, negative self talk, and helpless feelings from 6 to 7 days/week to 0  to 1 days/week, per client report, for at least 3 consecutive months.  Progress: 20% Short Term Goals:  Pt to verbally express understanding of the relationship between feelings of depression/anxiety and their impact on thinking patterns and behaviors.  Pt to verbalize an understanding of the role that distorted thinking plays in creating fears, excessive worry, and ruminations.  Progress: 20% Target Date:  10/10/2023 Frequency:  Bi-weekly Modality:  Cognitive Behavioral Therapy Interventions by Therapist:  Therapist will use CBT,  Mindfulness exercises, Coping skills and Referrals, as needed by client. Client has verbally approved this treatment plan.  Ivan Anchors, [facility]

## 2022-10-28 NOTE — Patient Instructions (Signed)
Blue-Emu cream -- use 2-3 times a day ? ?

## 2022-10-28 NOTE — Progress Notes (Signed)
Subjective:  Patient ID: [name], female    DOB: 1970/12/13  Age: 52 y.o. MRN: [id number]  CC: No chief complaint on file.   HPI DOROTHYANN MOURER presents for anxiety - better  C/o neck pain since November, worse since Sunday night 10/10 up the head, down R arm  It [place] better today    Outpatient Medications Prior to Visit  Medication Sig Dispense Refill   ALPRAZolam (XANAX) 0.25 MG tablet Take 1-2 tablets (0.25-0.5 mg total) by mouth 2 (two) times daily as needed for anxiety. 60 tablet 1   aspirin 325 MG EC tablet Take 325 mg by mouth daily.     Cholecalciferol (VITAMIN D3) 2000 units TABS Take 2,000 Units by mouth daily.      Cyanocobalamin (VITAMIN B-12) 500 MCG SUBL Place 1 tablet (500 mcg total) under the tongue 1 day or 1 dose. 100 tablet 3   escitalopram (LEXAPRO) 5 MG tablet Take 1 tablet (5 mg total) by mouth daily. 30 tablet 5   levothyroxine (SYNTHROID) 125 MCG tablet Take 1 tablet (125 mcg total) by mouth daily. 90 tablet 3   Ubrogepant (UBRELVY) 100 MG TABS Take 1 tablet by mouth daily as needed. 16 tablet 2   metroNIDAZOLE (FLAGYL) 500 MG tablet Take 1 tablet (500 mg total) by mouth 3 (three) times daily. (Patient not taking: Reported on 08/24/2022) 21 tablet 0   No facility-administered medications prior to visit.    ROS: Review of Systems  Constitutional:  Negative for activity change, appetite change, chills, fatigue and unexpected weight change.  HENT:  Negative for congestion, mouth sores and sinus pressure.   Eyes:  Negative for visual disturbance.  Respiratory:  Negative for cough and chest tightness.   Gastrointestinal:  Negative for abdominal pain and nausea.  Genitourinary:  Negative for difficulty urinating, frequency and vaginal pain.  Musculoskeletal:  Positive for neck pain. Negative for back pain and gait problem.  Skin:  Negative for pallor and rash.  Neurological:  Negative for dizziness, tremors, weakness, numbness and headaches.   Psychiatric/Behavioral:  Negative for confusion, dysphoric mood, sleep disturbance and suicidal ideas. The patient is nervous/anxious.     Objective:  BP 110/84 (BP Location: Right Arm, Patient Position: Sitting, Cuff Size: Large)   Pulse (!) 57   Temp 98.6 F (37 C) (Oral)   Ht '5\' 3"'$  (1.6 m)   Wt 153 lb (69.4 kg)   SpO2 97%   BMI 27.10 kg/m   BP Readings from Last 3 Encounters:  10/28/22 110/84  08/24/22 136/78  06/26/22 (!) 144/82    Wt Readings from Last 3 Encounters:  10/28/22 153 lb (69.4 kg)  08/24/22 156 lb (70.8 kg)  06/26/22 156 lb 6.4 oz (70.9 kg)    Physical Exam Constitutional:      General: She is not in acute distress.    Appearance: Normal appearance. She is well-developed.  HENT:     Head: Normocephalic.     Right Ear: External ear normal.     Left Ear: External ear normal.     Nose: Nose normal.  Eyes:     General:        Right eye: No discharge.        Left eye: No discharge.     Conjunctiva/sclera: Conjunctivae normal.     Pupils: Pupils are equal, round, and reactive to light.  Neck:     Thyroid: No thyromegaly.     Vascular: No JVD.  Trachea: No tracheal deviation.  Cardiovascular:     Rate and Rhythm: Normal rate and regular rhythm.     Heart sounds: Normal heart sounds.  Pulmonary:     Effort: No respiratory distress.     Breath sounds: No stridor. No wheezing.  Abdominal:     General: Bowel sounds are normal. There is no distension.     Palpations: Abdomen is soft. There is no mass.     Tenderness: There is no abdominal tenderness. There is no guarding or rebound.  Musculoskeletal:        General: No tenderness.     Cervical back: Normal range of motion and neck supple. No rigidity.  Lymphadenopathy:     Cervical: No cervical adenopathy.  Skin:    Findings: No erythema or rash.  Neurological:     Cranial Nerves: No cranial nerve deficit.     Motor: No abnormal muscle tone.     Coordination: Coordination normal.     Deep  Tendon Reflexes: Reflexes normal.  Psychiatric:        Behavior: Behavior normal.        Thought Content: Thought content normal.        Judgment: Judgment normal.   R trap is ender, stiff   R trap is very tender Lab Results  Component Value Date   WBC 3.3 (L) 12/10/2021   HGB 12.3 12/10/2021   HCT 37.8 12/10/2021   PLT 279.0 12/10/2021   GLUCOSE 89 12/10/2021   CHOL 216 (H) 12/10/2021   TRIG 118.0 12/10/2021   HDL 64.30 12/10/2021   LDLCALC 128 (H) 12/10/2021   ALT 14 12/10/2021   AST 19 12/10/2021   NA 140 12/10/2021   K 4.1 12/10/2021   CL 102 12/10/2021   CREATININE 0.77 12/10/2021   BUN 19 12/10/2021   CO2 29 12/10/2021   TSH 1.760 06/26/2022   INR 1.0 04/12/2020    MR ANGIO HEAD WO CONTRAST  Result Date: 03/16/2022 CLINICAL DATA:  Provided history: Aneurysm. EXAM: MRA HEAD WITHOUT CONTRAST TECHNIQUE: Angiographic images of the Circle of Willis were acquired using MRA technique without intravenous contrast. COMPARISON:  Prior MRA head examinations 01/06/2021 and earlier. FINDINGS: Anterior circulation: The intracranial internal carotid arteries are patent. As before, stent artifact limits evaluation for stenoses within the cavernous/paraclinoid left ICA. The M1 middle cerebral arteries are patent. No M2 proximal branch occlusion or high-grade proximal stenosis. The anterior cerebral arteries are patent. 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 No new intracranial aneurysm is identified. Posterior circulation: The intracranial vertebral arteries are patent. The basilar artery is patent. The posterior cerebral arteries are patent. Posterior communicating arteries are present bilaterally. Anatomic variants: None significant. IMPRESSION: Unchanged appearance of the treated superior hypophyseal region left ICA aneurysm with unchanged small neck remnant. Electronically Signed   By: Kellie Simmering D.O.   On:  03/16/2022 09:35    Assessment & Plan:   Problem List Items Addressed This Visit       Other   Cervical pain (neck) - Primary    R side - ?radiculitis Blue-Emu cream was recommended to use 2-3 times a day Medrol pack Tramadol prn X ray      Relevant Orders   DG Cervical Spine Complete      Meds ordered this encounter  Medications   valACYclovir (VALTREX) 1000 MG tablet    Sig: Take 1 tablet (1,000 mg total) by mouth 2 (two)  times daily for 10 days.    Dispense:  20 tablet    Refill:  0   methylPREDNISolone (MEDROL DOSEPAK) 4 MG TBPK tablet    Sig: As directed    Dispense:  21 tablet    Refill:  0   traMADol (ULTRAM) 50 MG tablet    Sig: Take 1 tablet (50 mg total) by mouth every 6 (six) hours as needed.    Dispense:  20 tablet    Refill:  1      Follow-up: No follow-ups on file.  Walker Kehr, MD

## 2022-10-28 NOTE — Assessment & Plan Note (Addendum)
R side - ?radiculitis Blue-Emu cream was recommended to use 2-3 times a day Medrol pack Tramadol prn X ray Valtrex if rash

## 2022-11-10 ENCOUNTER — Ambulatory Visit: Payer: BC Managed Care – PPO | Admitting: Psychology

## 2022-11-16 ENCOUNTER — Encounter: Payer: Self-pay | Admitting: Internal Medicine

## 2022-11-16 ENCOUNTER — Ambulatory Visit (INDEPENDENT_AMBULATORY_CARE_PROVIDER_SITE_OTHER): Payer: BC Managed Care – PPO | Admitting: Internal Medicine

## 2022-11-16 VITALS — BP 124/72 | HR 65 | Temp 97.8°F | Ht 63.0 in | Wt 153.0 lb

## 2022-11-16 DIAGNOSIS — M5412 Radiculopathy, cervical region: Secondary | ICD-10-CM | POA: Insufficient documentation

## 2022-11-16 DIAGNOSIS — M542 Cervicalgia: Secondary | ICD-10-CM

## 2022-11-16 MED ORDER — METHYLPREDNISOLONE 4 MG PO TBPK: ORAL_TABLET | ORAL | 0 refills | Status: DC

## 2022-11-16 NOTE — Progress Notes (Signed)
Subjective:  Patient ID: Linda Stewart, female    DOB: [birth year]  Age: 52 y.o. MRN: [id number]  CC: Follow-up   HPI Linda Stewart presents for R neck pain and R shoulder pain - Medrol helped x 2 days, then the pain returned...  Outpatient Medications Prior to Visit  Medication Sig Dispense Refill   ALPRAZolam (XANAX) 0.25 MG tablet Take 1-2 tablets (0.25-0.5 mg total) by mouth 2 (two) times daily as needed for anxiety. 60 tablet 1   aspirin 325 MG EC tablet Take 325 mg by mouth daily.     Cholecalciferol (VITAMIN D3) 2000 units TABS Take 2,000 Units by mouth daily.      Cyanocobalamin (VITAMIN B-12) 500 MCG SUBL Place 1 tablet (500 mcg total) under the tongue 1 day or 1 dose. 100 tablet 3   cyclobenzaprine (FLEXERIL) 5 MG tablet Take 1 tablet (5 mg total) by mouth 3 (three) times daily as needed for muscle spasms. 30 tablet 1   escitalopram (LEXAPRO) 5 MG tablet Take 1 tablet (5 mg total) by mouth daily. 30 tablet 5   levothyroxine (SYNTHROID) 125 MCG tablet Take 1 tablet (125 mcg total) by mouth daily. 90 tablet 3   traMADol (ULTRAM) 50 MG tablet Take 1 tablet (50 mg total) by mouth every 6 (six) hours as needed. 20 tablet 1   Ubrogepant (UBRELVY) 100 MG TABS Take 1 tablet by mouth daily as needed. 16 tablet 2   methylPREDNISolone (MEDROL DOSEPAK) 4 MG TBPK tablet As directed 21 tablet 0   No facility-administered medications prior to visit.    ROS: Review of Systems  Constitutional:  Negative for activity change, appetite change, chills, fatigue and unexpected weight change.  HENT:  Negative for congestion, mouth sores and sinus pressure.   Eyes:  Negative for visual disturbance.  Respiratory:  Negative for cough and chest tightness.   Gastrointestinal:  Negative for abdominal pain and nausea.  Genitourinary:  Negative for difficulty urinating, frequency and vaginal pain.  Musculoskeletal:  Positive for neck pain. Negative for back pain and gait problem.  Skin:  Negative  for pallor and rash.  Neurological:  Negative for dizziness, tremors, weakness, numbness and headaches.  Psychiatric/Behavioral:  Negative for confusion and sleep disturbance.     Objective:  BP 124/72 (BP Location: Left Arm, Patient Position: Sitting, Cuff Size: Normal)   Pulse 65   Temp 97.8 F (36.6 C) (Oral)   Ht 5' 3"$  (1.6 m)   Wt 153 lb (69.4 kg)   SpO2 100%   BMI 27.10 kg/m   BP Readings from Last 3 Encounters:  11/16/22 124/72  10/28/22 110/84  08/24/22 136/78    Wt Readings from Last 3 Encounters:  11/16/22 153 lb (69.4 kg)  10/28/22 153 lb (69.4 kg)  08/24/22 156 lb (70.8 kg)    Physical Exam Constitutional:      General: She is not in acute distress.    Appearance: She is well-developed.  HENT:     Head: Normocephalic.     Right Ear: External ear normal.     Left Ear: External ear normal.     Nose: Nose normal.  Eyes:     General:        Right eye: No discharge.        Left eye: No discharge.     Conjunctiva/sclera: Conjunctivae normal.     Pupils: Pupils are equal, round, and reactive to light.  Neck:     Thyroid: No thyromegaly.  Vascular: No JVD.     Trachea: No tracheal deviation.  Cardiovascular:     Rate and Rhythm: Normal rate and regular rhythm.     Heart sounds: Normal heart sounds.  Pulmonary:     Effort: No respiratory distress.     Breath sounds: No stridor. No wheezing.  Abdominal:     General: Bowel sounds are normal. There is no distension.     Palpations: Abdomen is soft. There is no mass.     Tenderness: There is no abdominal tenderness. There is no guarding or rebound.  Musculoskeletal:        General: No tenderness.     Cervical back: Normal range of motion and neck supple. No rigidity.  Lymphadenopathy:     Cervical: No cervical adenopathy.  Skin:    Findings: No erythema or rash.  Neurological:     Cranial Nerves: No cranial nerve deficit.     Motor: No abnormal muscle tone.     Coordination: Coordination normal.      Deep Tendon Reflexes: Reflexes normal.  Psychiatric:        Behavior: Behavior normal.        Thought Content: Thought content normal.        Judgment: Judgment normal.     Lab Results  Component Value Date   WBC 3.3 (L) 12/10/2021   HGB 12.3 12/10/2021   HCT 37.8 12/10/2021   PLT 279.0 12/10/2021   GLUCOSE 89 12/10/2021   CHOL 216 (H) 12/10/2021   TRIG 118.0 12/10/2021   HDL 64.30 12/10/2021   LDLCALC 128 (H) 12/10/2021   ALT 14 12/10/2021   AST 19 12/10/2021   NA 140 12/10/2021   K 4.1 12/10/2021   CL 102 12/10/2021   CREATININE 0.77 12/10/2021   BUN 19 12/10/2021   CO2 29 12/10/2021   TSH 1.760 06/26/2022   INR 1.0 04/12/2020    MR ANGIO HEAD WO CONTRAST  Result Date: 03/16/2022 CLINICAL DATA:  Provided history: Aneurysm. EXAM: MRA HEAD WITHOUT CONTRAST TECHNIQUE: Angiographic images of the Circle of Willis were acquired using MRA technique without intravenous contrast. COMPARISON:  Prior MRA head examinations 01/06/2021 and earlier. FINDINGS: Anterior circulation: The intracranial internal carotid arteries are patent. As before, stent artifact limits evaluation for stenoses within the cavernous/paraclinoid left ICA. The M1 middle cerebral arteries are patent. No M2 proximal branch occlusion or high-grade proximal stenosis. The anterior cerebral arteries are patent. 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 No new intracranial aneurysm is identified. Posterior circulation: The intracranial vertebral arteries are patent. The basilar artery is patent. The posterior cerebral arteries are patent. Posterior communicating arteries are present bilaterally. Anatomic variants: None significant. IMPRESSION: Unchanged appearance of the treated superior hypophyseal region left ICA aneurysm with unchanged small neck remnant. Electronically Signed   By: Kellie Simmering D.O.   On: 03/16/2022 09:35    Assessment & Plan:    Problem List Items Addressed This Visit       Nervous and Auditory   Cervical radiculitis    Not better Repeat Medrol pack Start PT Cervical traction option was discussed Ordered C spine MRI      Relevant Orders   MR Cervical Spine Wo Contrast   Ambulatory referral to Physical Therapy     Other   Cervical pain (neck) - Primary    Not better Repeat Medrol pack Start PT Cervical traction option was discussed Ordered C spine MRI  Relevant Orders   MR Cervical Spine Wo Contrast   Ambulatory referral to Physical Therapy      Meds ordered this encounter  Medications   methylPREDNISolone (MEDROL DOSEPAK) 4 MG TBPK tablet    Sig: As directed    Dispense:  21 tablet    Refill:  0      Follow-up: Return in about 4 weeks (around 12/14/2022) for a follow-up visit.  Walker Kehr, MD

## 2022-11-16 NOTE — Assessment & Plan Note (Signed)
Not better Repeat Medrol pack Start PT Cervical traction option was discussed Ordered C spine MRI

## 2022-11-19 ENCOUNTER — Ambulatory Visit: Payer: BC Managed Care – PPO | Admitting: Psychology

## 2022-11-19 ENCOUNTER — Ambulatory Visit (INDEPENDENT_AMBULATORY_CARE_PROVIDER_SITE_OTHER): Payer: BC Managed Care – PPO | Admitting: Psychology

## 2022-11-19 DIAGNOSIS — F4323 Adjustment disorder with mixed anxiety and depressed mood: Secondary | ICD-10-CM

## 2022-11-19 NOTE — Progress Notes (Signed)
[place] Counselor/Therapist Progress Note  Patient ID: FUTURE HAUS, MRN: [id number],    Date: 11/19/2022  Time Spent: 45 mins  Treatment Type: Individual Therapy  Reported Symptoms: Pt presents for session via WebEx video.  Pt grants consent for session, stating she is in her car with no one else present.  I shared with pt that I am in my office with no one else here either.  Mental Status Exam: Appearance:  Casual     Behavior: Appropriate  Motor: Normal  Speech/Language:  Clear and Coherent  Affect: Appropriate  Mood: normal  Thought process: normal  Thought content:   WNL  Sensory/Perceptual disturbances:   WNL  Orientation: oriented to person, place, and time/date  Attention: Good  Concentration: Good  Memory: WNL  Fund of knowledge:  Good  Insight:   Good  Judgment:  Good  Impulse Control: Good   Risk Assessment: Danger to Self:  No Self-injurious Behavior: No Danger to Others: No Duty to Warn:no Physical Aggression / Violence:No  Access to Firearms a concern: No  Gang Involvement:No   Subjective: Pt shares that she "has been thinking about how I am so much like my father because he yelled a lot when I was younger.  My father and mother separated when I was 18 yo and I lived with my father."  We talked about how we learn how to be adults by watching the adults who raise [place].  Pt shares that her 26 yo son Garlon Hatchet) is acting like pt (same concept that he is learning how to be an adult by watching her).  Pt is aware that she has the ability to not be so quick to anger in other settings.  Encouraged pt that she can learn new ways of behaving in her home where she feels the most comfortable.  Encouraged pt to talk with her family about working with her to try to be helpful to her in these efforts.  Pt shares that she has been very busy since our last session.  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  Pt shares that her neck pain is still bothering her and she is still seeing her PCP about that; she has an MRI scheduled for the last week of Feb for this issue.  Asked pt to talk with her PCP about the option of increasing her Lexapro dose to 10 mg to see if that helps her to be slower to get angry.  Also asked pt to have a family meeting to talk about how her anger and Javier's anger is not good for them and to ask for their help in working on new ways to interact.  Encouraged pt to continue with her self care activities and we will meet in 2 wks for a follow up session.  Interventions: Cognitive Behavioral Therapy  Diagnosis:Adjustment disorder with mixed anxiety and depressed mood  Plan: Treatment Plan Strengths/Abilities:  Intelligent, Intuitive, Willing to participate in therapy Treatment Preferences:  Outpatient Individual Therapy Statement of Needs:  Patient is to use CBT, mindfulness and coping skills to help manage and/or decrease symptoms associated with their diagnosis. Symptoms:  Depressed/Irritable mood, worry, social withdrawal Problems Addressed:  Depressive thoughts, Sadness, Sleep issues, etc. Long Term Goals:  Pt to reduce overall level,  frequency, and intensity of the feelings of depression/anxiety as evidenced by decreased irritability, negative self talk, and helpless feelings from 6 to 7 days/week to 0 to 1 days/week, per client report, for at least 3 consecutive months.  Progress: 20% Short Term Goals:  Pt to verbally express understanding of the relationship between feelings of depression/anxiety and their impact on thinking patterns and behaviors.  Pt to verbalize an understanding of the role that distorted thinking plays in creating fears, excessive worry, and ruminations.  Progress: 20% Target Date:  10/10/2023 Frequency:   Bi-weekly Modality:  Cognitive Behavioral Therapy Interventions by Therapist:  Therapist will use CBT, Mindfulness exercises, Coping skills and Referrals, as needed by client. Client has verbally approved this treatment plan.  Ivan Anchors, [facility]

## 2022-12-02 ENCOUNTER — Other Ambulatory Visit: Payer: Self-pay

## 2022-12-02 ENCOUNTER — Encounter: Payer: Self-pay | Admitting: Physical Therapy

## 2022-12-02 ENCOUNTER — Ambulatory Visit: Payer: BC Managed Care – PPO | Attending: Internal Medicine | Admitting: Physical Therapy

## 2022-12-02 DIAGNOSIS — M542 Cervicalgia: Secondary | ICD-10-CM | POA: Insufficient documentation

## 2022-12-02 DIAGNOSIS — M5412 Radiculopathy, cervical region: Secondary | ICD-10-CM | POA: Diagnosis not present

## 2022-12-02 NOTE — Therapy (Signed)
OUTPATIENT PHYSICAL THERAPY SHOULDER EVALUATION   Patient Name: Linda Stewart MRN: [id number] DOB:Mar 14, 1971, 52 y.o., female Today's Date: 12/03/2022   PT End of Session - 12/02/22 1822     Visit Number 1    Number of Visits --   1-2x/week   Date for PT Re-Evaluation 01/27/23    Authorization Type BCBS    PT Start Time 1752    PT Stop Time 1825    PT Time Calculation (min) 33 min             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HCC)    Endometriosis determined by laparoscopy 02/28/2016   Headache(784.0)    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   History of cerebral aneurysm repair dx 10-18-2006 via MRI/MRA  5.59mm x 4.5mm saccular   11-16-2006  stent-assisted coiling LICA intracranial superior hypophyseal region   History of CVA (cerebrovascular accident)    2003   History of ovarian cyst    Pelvic pain in female 12/05/2015   Uterine polyp 12/05/2015   Past Surgical History:  Procedure Laterality Date   HYSTEROSCOPY WITH D & C N/A 02/28/2016   Procedure: DILATATION AND CURETTAGE /DIAGNOSTIC HYSTEROSCOPY;  Surgeon: Sherian Rein, MD;  Location: [facility];  Service: Gynecology;  Laterality: N/A;   LAPAROSCOPIC OVARIAN CYSTECTOMY  2001   LAPAROSCOPY N/A 02/28/2016   Procedure: OPERATIVE LAPAROSCOPY; LYSIS OF ADHESIONS;  Surgeon: Sherian Rein, MD;  Location: [facility];  Service: Gynecology;  Laterality: N/A;   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  11-16-2006   dr deveshwar   coiling  (general anesthesia)   Patient Active Problem List   Diagnosis Date Noted   Cervical radiculitis 11/16/2022   Cervical pain (neck) 10/28/2022   Abnormal cervical Papanicolaou smear 08/24/2022   Aneurysm (HCC) 08/24/2022   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   Dysmenorrhea 08/24/2022   Perimenopausal disorder 08/24/2022    Mood disorder (HCC) 08/24/2022   Vitamin D deficiency 11/12/2020   Vaginitis 11/12/2020   Abnormal uterine bleeding 10/03/2020   Menorrhagia 10/03/2020   Migraine with aura and without status migrainosus, not intractable 04/16/2020   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   Right knee pain 09/18/2019   Abdominal pain 09/06/2019   Weight gain 09/06/2019   Hyperthyroidism 03/25/2017   Left wrist pain 02/26/2017   Cramps, extremity 12/10/2016   Loss of weight 08/17/2016   Endometriosis determined by laparoscopy 02/28/2016   Pelvic pain in female 12/05/2015   Uterine polyp 12/05/2015   Rectal pain 12/06/2013   LLQ abdominal pain 10/19/2013   Graves disease 10/18/2013   Hematuria 10/18/2013   Paresthesia 11/25/2011   B12 deficiency 11/25/2011   Well adult exam 07/17/2011   Neoplasm of uncertain behavior of skin 07/17/2011   PNEUMONIA 07/03/2010   Headache 06/04/2010   TORTICOLLIS, SPASMODIC 04/02/2010   NECK PAIN, LEFT 04/02/2010   Fatigue 06/03/2009   Low back pain 03/08/2009   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   Brain aneurysm 06/03/2007    PCP: Linda Stewart, Linda Quint, MD  REFERRING PROVIDER: Tresa Garter, MD  THERAPY DIAG:  Cervicalgia  REFERRING DIAG: Cervical pain (neck) [M54.2], Cervical radiculitis [M54.12]   Rationale for Evaluation and Treatment:  Rehabilitation  SUBJECTIVE:  PERTINENT PAST HISTORY:  Graves disease, migraine, aneurism      PRECAUTIONS: None  WEIGHT BEARING RESTRICTIONS No  FALLS:  Has patient fallen in last 6 months? No, Number of  falls: 0  MOI/History of condition:  Onset date: ~08/15/22  SUBJECTIVE STATEMENT  Linda Stewart is a 52 y.o. female who presents to clinic with chief complaint of R sided neck pain.  This pain started suddenly at work.  She states that she had a pulling sensation in her neck.  She states she had a momentary loss of control of her neck - she has not experienced this since but continues to  endorse R sided neck pain.  She states that the muscle on the R side of her neck is very sore.  She has had 2 rounds of steroids which helped briefly, but then the pain returned.  The pain is worse when she is stressed.  Denies n/t or weakness in UEs.  From referring provider:   "Linda Stewart presents for R neck pain and R shoulder pain - Medrol helped x 2 days, then the pain returned... "   Red flags:  denies bil numbness and tingling  Pain:  Are you having pain? Yes Pain location: R sided neck pain NPRS scale:  5/10 to 10/10 Aggravating factors: stress, rotation of neck Relieving factors: rest, heat Pain description: constant Stage: Chronic Stability: staying the same 24 hour pattern: worse with activity   Occupation: office job  Assistive Device: na  Hand Dominance: R  Patient Goals/Specific Activities: reduce pain   OBJECTIVE:   DIAGNOSTIC FINDINGS:  Degenerative disease with disc height loss at C5-6 and C6-7. Bilateral facet arthropathy at C7-T1. Right uncovertebral degenerative changes C5-6 with foraminal encroachment. Left neural foramina are patent.   IMPRESSION: 1. Cervical spine spondylosis as described above. 2. No acute osseous injury of the cervical spine.  GENERAL OBSERVATION:  Forward head     SENSATION:  Light touch: Appears intact   PALPATION: TTP proximal LS, and scalenes  Cervical ROM  ROM ROM  12/03/2022  Flexion 60  Extension 40  Right lateral flexion 30  Left lateral flexion 30*  Right rotation 52  Left rotation 55  Flexion rotation (normal is 30 degrees)   Flexion rotation (normal is 30 degrees)     (Blank rows = not tested, N = WNL, * = concordant pain)  UPPER EXTREMITY MMT:  MMT Right 12/03/2022 Left 12/03/2022  Shoulder flexion n n  Shoulder abduction (C5) n n  Shoulder ER n n  Shoulder IR    Middle trapezius    Lower trapezius    Shoulder extension    Grip strength    Shoulder shrug (C4)    Elbow flexion (C6)     Elbow ext (C7)    Thumb ext (C8)    Finger abd (T1)    Grossly     (Blank rows = not tested, score listed is out of 5 possible points.  N = WNL, D = diminished, C = clear for gross weakness with myotome testing, * = concordant pain with testing)   SPECIAL TESTS:  Spurling's (-)  JOINT MOBILITY TESTING:  Guarded with joint mobility testing   PATIENT SURVEYS:  FOTO 58 -> 66    TODAY'S TREATMENT:  Creating, reviewing, and completing below HEP    PATIENT EDUCATION:  POC, diagnosis, prognosis, HEP, and outcome measures.  Pt educated via explanation, demonstration, and handout (HEP).  Pt confirms understanding verbally.    HOME EXERCISE PROGRAM: Access Code: [email or web address] URL: [email or web address] Date: 12/02/2022 Prepared by: Alphonzo Severance  Exercises - Supine Chin Tuck  - 2 x daily - 7 x weekly - 3 sets - 10  reps - Seated Assisted Cervical Rotation with Towel  - 3 x daily - 7 x weekly - 2 sets - 15 reps  Treatment priorities   Eval (12/03/2022)        Manual/TDN                                          ASSESSMENT:  CLINICAL IMPRESSION: Nicci is a 52 y.o. female who presents to clinic with signs and sxs consistent with R sided neck pain secondary to muscle strain (levator and/or scalenes).  No sign of radiculopathy at this time.  Pt is guarded during passive neck movements.  OBJECTIVE IMPAIRMENTS: Pain, cervical ROM  ACTIVITY LIMITATIONS: work, driving, reading  PERSONAL FACTORS: See medical history and pertinent history   REHAB POTENTIAL: Good  CLINICAL DECISION MAKING: Stable/uncomplicated  EVALUATION COMPLEXITY: Low   GOALS:   SHORT TERM GOALS: Target date: 12/31/2022  Nathan will be >75% HEP compliant to improve carryover between sessions and facilitate independent management of condition  Evaluation (12/03/2022): ongoing Goal status: INITIAL   LONG TERM GOALS: Target date: 01/28/2023  Miona will improve FOTO score to 66 as  a proxy for functional improvement  Evaluation/Baseline (12/03/2022): 58 Goal status: INITIAL   2.  Jerriyah will self report >/= 50% decrease in pain from evaluation   Evaluation/Baseline (12/03/2022): 10/10 max pain Goal status: INITIAL   3.  Nesly will report confidence in self management of condition at time of discharge with advanced HEP  Evaluation/Baseline (12/03/2022): unable to self manage Goal status: INITIAL  4.  Braeleigh will be able to drive and work, not limited by pain  Evaluation/Baseline (12/03/2022): limited Goal status: INITIAL    PLAN: PT FREQUENCY: 1-2x/week  PT DURATION: 8 weeks (Ending 01/28/2023)  PLANNED INTERVENTIONS: Therapeutic exercises, Aquatic therapy, Therapeutic activity, Neuro Muscular re-education, Gait training, Patient/Family education, Joint mobilization, Dry Needling, Electrical stimulation, Spinal mobilization and/or manipulation, Moist heat, Taping, Vasopneumatic device, Ionotophoresis 4mg /ml Dexamethasone, and Manual therapy  PLAN FOR NEXT SESSION: TDN, manual, test DNF endurance   Alphonzo Severance PT, DPT 12/03/2022, 7:00 AM

## 2022-12-04 ENCOUNTER — Ambulatory Visit: Payer: BC Managed Care – PPO | Admitting: Psychology

## 2022-12-04 DIAGNOSIS — F4323 Adjustment disorder with mixed anxiety and depressed mood: Secondary | ICD-10-CM | POA: Diagnosis not present

## 2022-12-04 NOTE — Progress Notes (Signed)
[facility] Counselor/Therapist Progress Note  Patient ID: Linda Stewart, MRN: [id number],    Date: 12/04/2022  Time Spent: 45 mins  Treatment Type: Individual Therapy  Reported Symptoms: Pt presents for session via WebEx video.  Pt grants consent for session, stating she is in her car with no one else present.  I shared with pt that I am in my office with no one else here either.  Mental Status Exam: Appearance:  Casual     Behavior: Appropriate  Motor: Normal  Speech/Language:  Clear and Coherent  Affect: Appropriate  Mood: normal  Thought process: normal  Thought content:   WNL  Sensory/Perceptual disturbances:   WNL  Orientation: oriented to person, place, and time/date  Attention: Good  Concentration: Good  Memory: WNL  Fund of knowledge:  Good  Insight:   Good  Judgment:  Good  Impulse Control: Good   Risk Assessment: Danger to Self:  No Self-injurious Behavior: No Danger to Others: No Duty to Warn:no Physical Aggression / Violence:No  Access to Firearms a concern: No  Gang Involvement:No   Subjective: Pt shares that she "has been good since our last session.  I have not asked the doctor's office for an increase in my Lexapro dose yet.  I plan to do that soon.  I did talk with my family about needing their help to be the kind of mom I want to be and to have the kind of family we all want."  Pt reminds me that her oldest son, Garlon Hatchet, is autistic and cannot do everything that other 51 yo's can do.  She has also talked with her youngest son to help him understand Javier's difficulties.  Pt shares that her dog passed away recently (62 yo) and the whole family is sad about the loss.  They have one more dog (Mabel) and a cat still.  Pt shares she has she has been having some feelings about her step mother; "I moved in with her when I was in HS and until I moved to the [place].  She tends to talk negatively about me a lot; my father tended to side with her; it was  hard when I went to [place] to see my dad before he passed away.  She was always not nice to me."  Pt shares that she tends to visit her step mom whenever pt goes back to [place].  He step mom passed away this past December 23, 2022.  "I did not go to [place] for her funeral because I did not feel like I had to do that.  I felt like she took my dad from me and never truly loved me.  I felt this way since my mom passed away when I was 74 yo.  I feel like I lived the Cinderella story in my step mom's house."  Asked pt to be intentional and reflect on how far she has come in her life, given the difficult circumstances she came from as a child and a young person.  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  Pt shares she has seen a PT for her neck and she is going to be seeing him twice a week, starting next week.  Encouraged pt to continue with  her self care activities and we will meet in 2 wks for a follow up session.  Interventions: Cognitive Behavioral Therapy  Diagnosis:Adjustment disorder with mixed anxiety and depressed mood  Plan: Treatment Plan Strengths/Abilities:  Intelligent, Intuitive, Willing to participate in therapy Treatment Preferences:  Outpatient Individual Therapy Statement of Needs:  Patient is to use CBT, mindfulness and coping skills to help manage and/or decrease symptoms associated with their diagnosis. Symptoms:  Depressed/Irritable mood, worry, social withdrawal Problems Addressed:  Depressive thoughts, Sadness, Sleep issues, etc. Long Term Goals:  Pt to reduce overall level, frequency, and intensity of the feelings of depression/anxiety as evidenced by decreased irritability, negative self talk, and helpless feelings from 6 to 7 days/week to 0 to 1 days/week,  per client report, for at least 3 consecutive months.  Progress: 20% Short Term Goals:  Pt to verbally express understanding of the relationship between feelings of depression/anxiety and their impact on thinking patterns and behaviors.  Pt to verbalize an understanding of the role that distorted thinking plays in creating fears, excessive worry, and ruminations.  Progress: 20% Target Date:  10/10/2023 Frequency:  Bi-weekly Modality:  Cognitive Behavioral Therapy Interventions by Therapist:  Therapist will use CBT, Mindfulness exercises, Coping skills and Referrals, as needed by client. Client has verbally approved this treatment plan.  Ivan Anchors, [facility]

## 2022-12-08 ENCOUNTER — Telehealth: Payer: BC Managed Care – PPO | Admitting: Family Medicine

## 2022-12-08 DIAGNOSIS — J019 Acute sinusitis, unspecified: Secondary | ICD-10-CM | POA: Diagnosis not present

## 2022-12-08 DIAGNOSIS — B9789 Other viral agents as the cause of diseases classified elsewhere: Secondary | ICD-10-CM

## 2022-12-08 MED ORDER — PSEUDOEPH-BROMPHEN-DM 30-2-10 MG/5ML PO SYRP
5.0000 mL | ORAL_SOLUTION | Freq: Four times a day (QID) | ORAL | 0 refills | Status: DC | PRN
Start: 2022-12-08 — End: 2023-03-15

## 2022-12-08 MED ORDER — FLUTICASONE PROPIONATE 50 MCG/ACT NA SUSP: 2.0000 | Freq: Every day | NASAL | 0 refills | Status: DC

## 2022-12-08 MED ORDER — BENZONATATE 100 MG PO CAPS: 100.0000 mg | ORAL_CAPSULE | Freq: Three times a day (TID) | ORAL | 0 refills | Status: DC | PRN

## 2022-12-08 NOTE — Patient Instructions (Signed)
Linda Stewart, thank you for joining Perlie Mayo, NP for today's virtual visit.  While this provider is not your primary care provider (PCP), if your PCP is located in our provider database this encounter information will be shared with them immediately following your visit.   [facility] account gives you access to today's visit and all your visits, tests, and labs performed at [facility] " click here if you don't have a [facility] account or go to [email or web address]  Consent: (Patient) Linda Stewart provided verbal consent for this virtual visit at the beginning of the encounter.  Current Medications:  Current Outpatient Medications:    benzonatate (TESSALON) 100 MG capsule, Take 1 capsule (100 mg total) by mouth 3 (three) times daily as needed for cough., Disp: 20 capsule, Rfl: 0   brompheniramine-pseudoephedrine-DM 30-2-10 MG/5ML syrup, Take 5 mLs by mouth 4 (four) times daily as needed., Disp: 120 mL, Rfl: 0   fluticasone (FLONASE) 50 MCG/ACT nasal spray, Place 2 sprays into both nostrils daily., Disp: 16 g, Rfl: 0   ALPRAZolam (XANAX) 0.25 MG tablet, Take 1-2 tablets (0.25-0.5 mg total) by mouth 2 (two) times daily as needed for anxiety., Disp: 60 tablet, Rfl: 1   aspirin 325 MG EC tablet, Take 325 mg by mouth daily., Disp: , Rfl:    Cholecalciferol (VITAMIN D3) 2000 units TABS, Take 2,000 Units by mouth daily. , Disp: , Rfl:    Cyanocobalamin (VITAMIN B-12) 500 MCG SUBL, Place 1 tablet (500 mcg total) under the tongue 1 day or 1 dose., Disp: 100 tablet, Rfl: 3   cyclobenzaprine (FLEXERIL) 5 MG tablet, Take 1 tablet (5 mg total) by mouth 3 (three) times daily as needed for muscle spasms., Disp: 30 tablet, Rfl: 1   escitalopram (LEXAPRO) 5 MG tablet, Take 1 tablet (5 mg total) by mouth daily., Disp: 30 tablet, Rfl: 5   levothyroxine (SYNTHROID) 125 MCG tablet, Take 1 tablet (125 mcg total) by mouth daily., Disp: 90 tablet, Rfl: 3    methylPREDNISolone (MEDROL DOSEPAK) 4 MG TBPK tablet, As directed, Disp: 21 tablet, Rfl: 0   traMADol (ULTRAM) 50 MG tablet, Take 1 tablet (50 mg total) by mouth every 6 (six) hours as needed., Disp: 20 tablet, Rfl: 1   Ubrogepant (UBRELVY) 100 MG TABS, Take 1 tablet by mouth daily as needed., Disp: 16 tablet, Rfl: 2   Medications ordered in this encounter:  Meds ordered this encounter  Medications   benzonatate (TESSALON) 100 MG capsule    Sig: Take 1 capsule (100 mg total) by mouth 3 (three) times daily as needed for cough.    Dispense:  20 capsule    Refill:  0    Order Specific Question:   Supervising Provider    Answer:   Chase Picket [id number]   brompheniramine-pseudoephedrine-DM 30-2-10 MG/5ML syrup    Sig: Take 5 mLs by mouth 4 (four) times daily as needed.    Dispense:  120 mL    Refill:  0    Order Specific Question:   Supervising Provider    Answer:   Chase Picket [id number]   fluticasone (FLONASE) 50 MCG/ACT nasal spray    Sig: Place 2 sprays into both nostrils daily.    Dispense:  16 g    Refill:  0    Order Specific Question:   Supervising Provider    Answer:   Chase Picket [id number]     *If you need refills on other medications  prior to your next appointment, please contact your pharmacy*  Follow-Up: Call back or seek an in-person evaluation if the symptoms worsen or if the condition fails to improve as anticipated.  [place] [phone]  Other Instructions  -Take meds as prescribed -Rest -Use a cool mist humidifier especially during the winter months when heat dries out the air. - Use saline nose sprays frequently to help soothe nasal passages and promote drainage. -Saline irrigations of the nose can be very helpful if done frequently.             * 4X daily for 1 week*             * Use of a nettie pot can be helpful with this.  *Follow directions with this* *Boiled or distilled water only -stay hydrated by drinking plenty  of fluids - Keep thermostat turn down low to prevent drying out sinuses - For any cough or congestion- robitussin DM or Delsym as needed - For fever or aches or pains- take tylenol or ibuprofen as directed on bottle             * for fevers greater than 101 orally you may alternate ibuprofen and tylenol every 3 hours.  If you do not improve you will need a follow up visit in person.                  If you have been instructed to have an in-person evaluation today at a local Urgent Care facility, please use the link below. It will take you to a list of all of our available [facility], including address, phone number and hours of operation. Please do not delay care.  [facility]  If you or a family member do not have a primary care provider, use the link below to schedule a visit and establish care. When you choose a [facility] primary care physician or advanced practice provider, you gain a long-term partner in health. Find a Primary Care Provider  Learn more about 's in-office and virtual care options: [facility]

## 2022-12-08 NOTE — Progress Notes (Signed)
Virtual Visit Consent   Linda Stewart, you are scheduled for a virtual visit with a [facility] provider today. Just as with appointments in the office, your consent must be obtained to participate. Your consent will be active for this visit and any virtual visit you may have with one of our providers in the next 365 days. If you have a MyChart account, a copy of this consent can be sent to you electronically.  As this is a virtual visit, video technology does not allow for your provider to perform a traditional examination. This may limit your provider's ability to fully assess your condition. If your provider identifies any concerns that need to be evaluated in person or the need to arrange testing (such as labs, EKG, etc.), we will make arrangements to do so. Although advances in technology are sophisticated, we cannot ensure that it will always work on either your end or our end. If the connection with a video visit is poor, the visit may have to be switched to a telephone visit. With either a video or telephone visit, we are not always able to ensure that we have a secure connection.  By engaging in this virtual visit, you consent to the provision of healthcare and authorize for your insurance to be billed (if applicable) for the services provided during this visit. Depending on your insurance coverage, you may receive a charge related to this service.  I need to obtain your verbal consent now. Are you willing to proceed with your visit today? [name] has provided verbal consent on 12/08/2022 for a virtual visit (video or telephone). Perlie Mayo, NP  Date: 12/08/2022 10:44 AM  Virtual Visit via Video Note   I, Perlie Mayo, connected with  Linda Stewart  ([id number], 04-Apr-1971) on 12/08/22 at 10:45 AM EST by a video-enabled telemedicine application and verified that I am speaking with the correct person using two identifiers.  Location: Patient: Virtual Visit Location Patient:  Home Provider: Virtual Visit Location Provider: Home Office   I discussed the limitations of evaluation and management by telemedicine and the availability of in person appointments. The patient expressed understanding and agreed to proceed.    History of Present Illness: Linda Stewart is a 52 y.o. who identifies as a female who was assigned female at birth, and is being seen today for cough and congestion  Onset was feeling poorly over weekend with a mild sore throat, Sunday started to cough more frequent- with increase over the night.          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 COVID test: neg  Vaccines: Flu season yes- covid yes- no boosters   Problems:  Patient Active Problem List   Diagnosis Date Noted   Cervical radiculitis 11/16/2022   Cervical pain (neck) 10/28/2022   Abnormal cervical Papanicolaou smear 08/24/2022   Aneurysm ([place]) 08/24/2022   Arnold-Chiari malformation ([place]) 08/24/2022   Dysmenorrhea 08/24/2022   Perimenopausal disorder 08/24/2022   Mood disorder ([place]) 08/24/2022   Vitamin D deficiency 11/12/2020   Vaginitis 11/12/2020   Abnormal uterine bleeding 10/03/2020   Menorrhagia 10/03/2020   Migraine with aura and without status migrainosus, not intractable 04/16/2020   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   Right knee pain 09/18/2019   Abdominal  pain 09/06/2019   Weight gain 09/06/2019   Hyperthyroidism 03/25/2017   Left wrist pain 02/26/2017   Cramps, extremity 12/10/2016   Loss of weight 08/17/2016   Endometriosis determined by laparoscopy 02/28/2016   Pelvic pain in female 12/05/2015   Uterine polyp 12/05/2015   Rectal pain 12/06/2013   LLQ abdominal pain 10/19/2013   Graves disease 10/18/2013   Hematuria  10/18/2013   Paresthesia 11/25/2011   B12 deficiency 11/25/2011   Well adult exam 07/17/2011   Neoplasm of uncertain behavior of skin 07/17/2011   PNEUMONIA 07/03/2010   Headache 06/04/2010   TORTICOLLIS, SPASMODIC 04/02/2010   NECK PAIN, LEFT 04/02/2010   Fatigue 06/03/2009   Low back pain 03/08/2009   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 , Disp: , Rfl:    Cyanocobalamin (VITAMIN B-12) 500 MCG SUBL, Place 1 tablet (500 mcg total) under the tongue 1 day or 1 dose., Disp: 100 tablet, Rfl: 3   cyclobenzaprine (FLEXERIL) 5 MG tablet, Take 1 tablet (5 mg total) by mouth 3 (three) times daily as needed for muscle spasms., Disp: 30 tablet, Rfl: 1   escitalopram (LEXAPRO) 5 MG tablet, Take 1 tablet (5 mg total) by mouth daily., Disp: 30 tablet, Rfl: 5   levothyroxine (SYNTHROID) 125 MCG tablet, Take 1 tablet (125 mcg total) by mouth daily., Disp: 90 tablet, Rfl: 3   methylPREDNISolone (MEDROL DOSEPAK) 4 MG TBPK tablet, As directed, Disp: 21 tablet, Rfl: 0   traMADol (ULTRAM) 50 MG tablet, Take 1 tablet (50 mg total) by mouth every 6 (six) hours as needed., Disp: 20 tablet, Rfl: 1   Ubrogepant (UBRELVY) 100 MG TABS, Take 1 tablet by mouth daily as needed., Disp: 16 tablet, Rfl: 2  Observations/Objective: Patient is well-developed, well-nourished in no acute distress.  Resting comfortably  at home.  Head is normocephalic, atraumatic.  No labored breathing.  Speech is clear and coherent with logical content.  Patient is alert and oriented at baseline.    Assessment and Plan:  1.  Acute viral sinusitis  - benzonatate (TESSALON) 100 MG capsule; Take 1 capsule (100 mg total) by mouth 3 (three) times daily as needed for cough.  Dispense: 20 capsule; Refill: 0 - brompheniramine-pseudoephedrine-DM 30-2-10 MG/5ML syrup; Take 5 mLs by mouth 4 (four) times daily as needed.  Dispense: 120 mL; Refill: 0 - fluticasone (FLONASE) 50 MCG/ACT nasal spray; Place 2 sprays into both nostrils daily.  Dispense: 16 g; Refill: 0  -Take meds as prescribed -Rest -Use a cool mist humidifier especially during the winter months when heat dries out the air. - Use saline nose sprays frequently to help soothe nasal passages and promote drainage. -Saline irrigations of the nose can be very helpful if done frequently.             * 4X daily for 1 week*             * Use of a nettie pot can be helpful with this.  *Follow directions with this* *Boiled or distilled water only -stay hydrated by drinking plenty of fluids -  Keep thermostat turn down low to prevent drying out sinuses - For any cough or congestion- robitussin DM or Delsym as needed - For fever or aches or pains- take tylenol or ibuprofen as directed on bottle             * for fevers greater than 101 orally you may alternate ibuprofen and tylenol every 3 hours.  If you do not improve you will need a follow up visit in person.               Reviewed side effects, risks and benefits of medication.    Patient acknowledged agreement and understanding of the plan.   Past Medical, Surgical, Social History, Allergies, and Medications have been Reviewed.      Follow Up Instructions: I discussed the assessment and treatment plan with the patient. The patient was provided an opportunity to ask questions and all were answered. The patient agreed with the plan and demonstrated an understanding of the instructions.  A copy of instructions were sent to the patient via MyChart unless otherwise noted below.    The patient was advised to call  back or seek an in-person evaluation if the symptoms worsen or if the condition fails to improve as anticipated.  Time:  I spent 10 minutes with the patient via telehealth technology discussing the above problems/concerns.    Perlie Mayo, NP

## 2022-12-14 ENCOUNTER — Ambulatory Visit: Payer: BC Managed Care – PPO

## 2022-12-16 ENCOUNTER — Encounter: Payer: BC Managed Care – PPO | Admitting: Physical Therapy

## 2022-12-18 ENCOUNTER — Ambulatory Visit (INDEPENDENT_AMBULATORY_CARE_PROVIDER_SITE_OTHER): Payer: BC Managed Care – PPO | Admitting: Psychology

## 2022-12-18 DIAGNOSIS — F4323 Adjustment disorder with mixed anxiety and depressed mood: Secondary | ICD-10-CM | POA: Diagnosis not present
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
## 2022-12-21 ENCOUNTER — Ambulatory Visit: Payer: BC Managed Care – PPO | Attending: Internal Medicine

## 2022-12-21 DIAGNOSIS — M542 Cervicalgia: Secondary | ICD-10-CM | POA: Diagnosis present

## 2022-12-21 NOTE — Therapy (Signed)
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[place])    Endometriosis determined by laparoscopy 02/28/2016   Headache(784.0)    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   History of cerebral aneurysm repair dx 10-18-2006 via MRI/MRA  5.30mm x 4.85mm saccular   11-16-2006  stent-assisted coiling LICA intracranial superior hypophyseal region   History of CVA (cerebrovascular accident)    2003   History of ovarian cyst    Pelvic pain in female 12/05/2015   Uterine polyp 12/05/2015   Past Surgical History:  Procedure Laterality Date   HYSTEROSCOPY WITH D & C N/A 02/28/2016   Procedure: DILATATION AND CURETTAGE /DIAGNOSTIC HYSTEROSCOPY;  Surgeon: Janyth Contes, MD;  Location: [place];  Service: Gynecology;  Laterality: N/A;   LAPAROSCOPIC OVARIAN CYSTECTOMY  2001   LAPAROSCOPY N/A 02/28/2016   Procedure: OPERATIVE LAPAROSCOPY; LYSIS OF ADHESIONS;  Surgeon: Janyth Contes, MD;  Location: [place];  Service: Gynecology;  Laterality: N/A;   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  11-16-2006   dr deveshwar   coiling  (general anesthesia)   Patient Active Problem List   Diagnosis Date Noted   Cervical radiculitis 11/16/2022   Cervical pain (neck) 10/28/2022   Abnormal cervical Papanicolaou smear 08/24/2022   Aneurysm ([place]) 08/24/2022   Arnold-Chiari  malformation ([place]) 08/24/2022   Dysmenorrhea 08/24/2022   Perimenopausal disorder 08/24/2022   Mood disorder ([place]) 08/24/2022   Vitamin D deficiency 11/12/2020   Vaginitis 11/12/2020   Abnormal uterine bleeding 10/03/2020   Menorrhagia 10/03/2020   Migraine with aura and without status migrainosus, not intractable 04/16/2020   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   Right knee pain 09/18/2019   Abdominal pain 09/06/2019   Weight gain 09/06/2019   Hyperthyroidism 03/25/2017   Left wrist pain 02/26/2017   Cramps, extremity 12/10/2016   Loss of weight 08/17/2016   Endometriosis determined by laparoscopy 02/28/2016   Pelvic pain in female 12/05/2015   Uterine polyp 12/05/2015   Rectal pain 12/06/2013   LLQ abdominal pain 10/19/2013   Graves disease 10/18/2013   Hematuria 10/18/2013   Paresthesia 11/25/2011   B12 deficiency 11/25/2011   Well adult exam 07/17/2011   Neoplasm of uncertain behavior of skin 07/17/2011   PNEUMONIA 07/03/2010   Headache 06/04/2010   TORTICOLLIS, SPASMODIC 04/02/2010   NECK PAIN, LEFT 04/02/2010   Fatigue 06/03/2009   Low back pain 03/08/2009   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   Brain aneurysm 06/03/2007    REFERRING DIAG: Cervical pain (neck) [M54.2], Cervical radiculitis [M54.12]   THERAPY DIAG:  Cervicalgia  Rationale for Evaluation and Treatment Rehabilitation  PERTINENT HISTORY: Graves disease, migraine, aneurism   PRECAUTIONS: None   SUBJECTIVE:  SUBJECTIVE STATEMENT:  Pt presents to PT with reports of continued pain in R side of neck. Has been compliant with HEP with no adverse effect.    PAIN:  Are you having pain? Yes Pain location: R sided neck pain NPRS scale:  5/10 to 10/10 Aggravating factors: stress, rotation of neck Relieving factors:  rest, heat Pain description: constant Stage: Chronic Stability: staying the same 24 hour pattern: worse with activity    OBJECTIVE: (objective measures completed at initial evaluation unless otherwise dated)  DIAGNOSTIC FINDINGS:  Degenerative disease with disc height loss at C5-6 and C6-7. Bilateral facet arthropathy at C7-T1. Right uncovertebral degenerative changes C5-6 with foraminal encroachment. Left neural foramina are patent.   IMPRESSION: 1. Cervical spine spondylosis as described above. 2. No acute osseous injury of the cervical spine.   GENERAL OBSERVATION:          Forward head                               SENSATION:          Light touch: Appears intact           PALPATION: TTP proximal LS, and scalenes   Cervical ROM   ROM ROM  12/03/2022  Flexion 60  Extension 40  Right lateral flexion 30  Left lateral flexion 30*  Right rotation 52  Left rotation 55  Flexion rotation (normal is 30 degrees)    Flexion rotation (normal is 30 degrees)      (Blank rows = not tested, N = WNL, * = concordant pain)   UPPER EXTREMITY MMT:   MMT Right 12/03/2022 Left 12/03/2022  Shoulder flexion n n  Shoulder abduction (C5) n n  Shoulder ER n n  Shoulder IR      Middle trapezius      Lower trapezius      Shoulder extension      Grip strength      Shoulder shrug (C4)      Elbow flexion (C6)      Elbow ext (C7)      Thumb ext (C8)      Finger abd (T1)      Grossly        (Blank rows = not tested, score listed is out of 5 possible points.  N = WNL, D = diminished, C = clear for gross weakness with myotome testing, * = concordant pain with testing)     SPECIAL TESTS:           Spurling's (-)   JOINT MOBILITY TESTING:  Guarded with joint mobility testing    PATIENT SURVEYS:  FOTO 58 -> 66              TODAY'S TREATMENT:  [facility] Adult PT Treatment:                                                DATE: 12/21/2022  Therapeutic Exercise: UBE lvl 1.0 x 4 min while  taking subjective  Supine chin tuck x 10  Row 2x10 GTB Manual Therapy: Skilled palpation for TPDN Suboccipital release STM to R upper trap, R suboccipitals Positional release to R upper trap Modalites MHP to cervical spine x 10 min while taking subjective  Trigger Point Dry Needling Treatment: Pre-treatment instruction: Patient instructed on dry needling rationale, procedures, and possible side effects including pain during treatment (achy,cramping feeling), bruising, drop of blood, lightheadedness, nausea, sweating. Patient Consent Given: Yes Education handout provided: No Muscles treated: R upper trap  Needle size and number: .30x63mm x 4 Electrical stimulation performed: No Parameters: N/A Treatment response/outcome: Twitch response elicited and Palpable decrease in muscle tension Post-treatment instructions: Patient instructed to expect possible mild to moderate muscle soreness later today and/or tomorrow. Patient instructed in methods to reduce muscle soreness and to continue prescribed HEP. If patient was dry needled over the lung field, patient was instructed on signs and symptoms of pneumothorax and, however unlikely, to see immediate medical attention should they occur. Patient was also educated on signs and symptoms of infection and to seek medical attention should they occur. Patient verbalized understanding of these instructions and education.    PATIENT EDUCATION:  POC, diagnosis, prognosis, HEP, and outcome measures.  Pt educated via explanation, demonstration, and handout (HEP).  Pt confirms understanding verbally.             HOME EXERCISE PROGRAM: Access Code: [email or web address] URL: [email or web address] Date: 12/21/2022 Prepared by: Octavio Manns  Exercises - Supine Chin Tuck  - 2 x daily - 7 x weekly - 3 sets - 10 reps - Seated Assisted Cervical Rotation with Towel  - 3 x daily - 7 x weekly - 2 sets - 15 reps - Standing Shoulder Row with Anchored  Resistance  - 1 x daily - 7 x weekly - 3 sets - 10 reps - green band hold   Treatment priorities     Eval (12/03/2022)              Manual/TDN                                                                              ASSESSMENT:   CLINICAL IMPRESSION: Pt was able to complete prescribed exercises with no adverse effect. Responded well to TPDN and manual, noting decrease in pain post session. HEP updated for initial periscapular strengthening. Will continue per POC.    OBJECTIVE IMPAIRMENTS: Pain, cervical ROM   ACTIVITY LIMITATIONS: work, driving, reading   PERSONAL FACTORS: See medical history and pertinent history       GOALS:     SHORT TERM GOALS: Target date: 12/31/2022   Lamika will be >75% HEP compliant to improve carryover between sessions and facilitate independent management of condition   Evaluation (12/03/2022): ongoing Goal status: INITIAL     LONG TERM GOALS: Target date: 01/28/2023   Keeyana will improve FOTO score to 66 as a proxy for functional improvement   Evaluation/Baseline (12/03/2022): 58 Goal status: INITIAL     2.  Rashonna will self report >/= 50% decrease in pain from evaluation    Evaluation/Baseline (12/03/2022): 10/10 max pain Goal status: INITIAL     3.  Janielys will report confidence in self management of condition at time of discharge with advanced HEP   Evaluation/Baseline (12/03/2022): unable to self manage Goal status: INITIAL   4.  Caterine will be able to drive and work, not limited by pain   Evaluation/Baseline (12/03/2022): limited Goal  status: INITIAL       PLAN: PT FREQUENCY: 1-2x/week   PT DURATION: 8 weeks (Ending 01/28/2023)   PLANNED INTERVENTIONS: Therapeutic exercises, Aquatic therapy, Therapeutic activity, Neuro Muscular re-education, Gait training, Patient/Family education, Joint mobilization, Dry Needling, Electrical stimulation, Spinal mobilization and/or manipulation, Moist heat, Taping, Vasopneumatic  device, Ionotophoresis 4mg /ml Dexamethasone, and Manual therapy   PLAN FOR NEXT SESSION: TDN, manual, test DNF endurance   Ward Chatters, PT 12/21/2022, 11:35 AM

## 2022-12-23 ENCOUNTER — Encounter: Payer: BC Managed Care – PPO | Admitting: Physical Therapy

## 2022-12-23 NOTE — Therapy (Signed)
OUTPATIENT PHYSICAL THERAPY TREATMENT NOTE   Patient Name: Linda Stewart MRN: [id number] DOB:October 17, 1970, 52 y.o., female Today's Date: 12/24/2022  PCP: Cassandria Anger, MD  REFERRING PROVIDER: Cassandria Anger, MD   END OF SESSION:   PT End of Session - 12/24/22 0915     Visit Number 3    Number of Visits --   1-2x/week   Date for PT Re-Evaluation 01/27/23    Authorization Type BCBS    PT Start Time 0915    PT Stop Time 0955    PT Time Calculation (min) 40 min    Activity Tolerance Patient tolerated treatment well    Behavior During Therapy [facility] for tasks assessed/performed              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[place])    Endometriosis determined by laparoscopy 02/28/2016   Headache(784.0)    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   History of cerebral aneurysm repair dx 10-18-2006 via MRI/MRA  5.38mm x 4.36mm saccular   11-16-2006  stent-assisted coiling LICA intracranial superior hypophyseal region   History of CVA (cerebrovascular accident)    2003   History of ovarian cyst    Pelvic pain in female 12/05/2015   Uterine polyp 12/05/2015   Past Surgical History:  Procedure Laterality Date   HYSTEROSCOPY WITH D & C N/A 02/28/2016   Procedure: DILATATION AND CURETTAGE /DIAGNOSTIC HYSTEROSCOPY;  Surgeon: Janyth Contes, MD;  Location: [place];  Service: Gynecology;  Laterality: N/A;   LAPAROSCOPIC OVARIAN CYSTECTOMY  2001   LAPAROSCOPY N/A 02/28/2016   Procedure: OPERATIVE LAPAROSCOPY; LYSIS OF ADHESIONS;  Surgeon: Janyth Contes, MD;  Location: [place];  Service: Gynecology;  Laterality: N/A;   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  11-16-2006   dr deveshwar   coiling  (general anesthesia)   Patient Active Problem List   Diagnosis Date Noted   Cervical radiculitis 11/16/2022   Cervical pain  (neck) 10/28/2022   Abnormal cervical Papanicolaou smear 08/24/2022   Aneurysm ([place]) 08/24/2022   Arnold-Chiari malformation ([place]) 08/24/2022   Dysmenorrhea 08/24/2022   Perimenopausal disorder 08/24/2022   Mood disorder ([place]) 08/24/2022   Vitamin D deficiency 11/12/2020   Vaginitis 11/12/2020   Abnormal uterine bleeding 10/03/2020   Menorrhagia 10/03/2020   Migraine with aura and without status migrainosus, not intractable 04/16/2020   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   Right knee pain 09/18/2019   Abdominal pain 09/06/2019   Weight gain 09/06/2019   Hyperthyroidism 03/25/2017   Left wrist pain 02/26/2017   Cramps, extremity 12/10/2016   Loss of weight 08/17/2016   Endometriosis determined by laparoscopy 02/28/2016   Pelvic pain in female 12/05/2015   Uterine polyp 12/05/2015   Rectal pain 12/06/2013   LLQ abdominal pain 10/19/2013   Graves disease 10/18/2013   Hematuria 10/18/2013   Paresthesia 11/25/2011   B12 deficiency 11/25/2011   Well adult exam 07/17/2011   Neoplasm of uncertain behavior of skin 07/17/2011   PNEUMONIA 07/03/2010   Headache 06/04/2010   TORTICOLLIS, SPASMODIC 04/02/2010   NECK PAIN, LEFT 04/02/2010   Fatigue 06/03/2009   Low back pain 03/08/2009   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   Brain aneurysm 06/03/2007    REFERRING DIAG: Cervical pain (neck) [M54.2], Cervical radiculitis [M54.12]   THERAPY DIAG:  Cervicalgia  Rationale for Evaluation and Treatment Rehabilitation  PERTINENT HISTORY: Graves disease, migraine, aneurism   PRECAUTIONS: None  SUBJECTIVE:                                                                                                                                                                                      SUBJECTIVE STATEMENT:  Pt presents with increased pain and HA symptoms today. Has been compliant with HEP.   PAIN:  Are you having pain? Yes Pain location: R sided neck pain NPRS scale:  5/10  to 10/10 Aggravating factors: stress, rotation of neck Relieving factors: rest, heat Pain description: constant Stage: Chronic Stability: staying the same 24 hour pattern: worse with activity    OBJECTIVE: (objective measures completed at initial evaluation unless otherwise dated)  DIAGNOSTIC FINDINGS:  Degenerative disease with disc height loss at C5-6 and C6-7. Bilateral facet arthropathy at C7-T1. Right uncovertebral degenerative changes C5-6 with foraminal encroachment. Left neural foramina are patent.   IMPRESSION: 1. Cervical spine spondylosis as described above. 2. No acute osseous injury of the cervical spine.   GENERAL OBSERVATION:          Forward head                               SENSATION:          Light touch: Appears intact           PALPATION: TTP proximal LS, and scalenes   Cervical ROM   ROM ROM  12/03/2022  Flexion 60  Extension 40  Right lateral flexion 30  Left lateral flexion 30*  Right rotation 52  Left rotation 55  Flexion rotation (normal is 30 degrees)    Flexion rotation (normal is 30 degrees)      (Blank rows = not tested, N = WNL, * = concordant pain)   UPPER EXTREMITY MMT:   MMT Right 12/03/2022 Left 12/03/2022  Shoulder flexion n n  Shoulder abduction (C5) n n  Shoulder ER n n  Shoulder IR      Middle trapezius      Lower trapezius      Shoulder extension      Grip strength      Shoulder shrug (C4)      Elbow flexion (C6)      Elbow ext (C7)      Thumb ext (C8)      Finger abd (T1)      Grossly        (Blank rows = not tested, score listed is out of 5 possible points.  N = WNL, D = diminished, C = clear for gross weakness with myotome testing, * = concordant pain with testing)  SPECIAL TESTS:           Spurling's (-)   JOINT MOBILITY TESTING:  Guarded with joint mobility testing    PATIENT SURVEYS:  FOTO 58 -> 66              TODAY'S TREATMENT:  [facility] Adult PT Treatment:                                                 DATE: 12/24/2022  Therapeutic Exercise: UBE lvl 1.5 x 3 min while taking subjective  Supine horizontal abd 2x10 RTB Supine chin tuck x 10  Seated bilateral ER 2x10 RTB Row 2x10 GTB Manual Therapy: Skilled palpation for TPDN Suboccipital release STM to R upper trap, R suboccipitals Positional release to R upper trap Modalites MHP to cervical spine x 10 min post session   Trigger Point Dry Needling Treatment: Pre-treatment instruction: Patient instructed on dry needling rationale, procedures, and possible side effects including pain during treatment (achy,cramping feeling), bruising, drop of blood, lightheadedness, nausea, sweating. Patient Consent Given: Yes Education handout provided: No Muscles treated: R upper trap  Needle size and number: .30x37mm x 4 Electrical stimulation performed: No Parameters: N/A Treatment response/outcome: Twitch response elicited and Palpable decrease in muscle tension Post-treatment instructions: Patient instructed to expect possible mild to moderate muscle soreness later today and/or tomorrow. Patient instructed in methods to reduce muscle soreness and to continue prescribed HEP. If patient was dry needled over the lung field, patient was instructed on signs and symptoms of pneumothorax and, however unlikely, to see immediate medical attention should they occur. Patient was also educated on signs and symptoms of infection and to seek medical attention should they occur. Patient verbalized understanding of these instructions and education.    PATIENT EDUCATION:  POC, diagnosis, prognosis, HEP, and outcome measures.  Pt educated via explanation, demonstration, and handout (HEP).  Pt confirms understanding verbally.             HOME EXERCISE PROGRAM: Access Code: [email or web address] URL: [email or web address] Date: 12/21/2022 Prepared by: Octavio Manns  Exercises - Supine Chin Tuck  - 2 x daily - 7 x weekly - 3 sets - 10 reps - Seated Assisted  Cervical Rotation with Towel  - 3 x daily - 7 x weekly - 2 sets - 15 reps - Standing Shoulder Row with Anchored Resistance  - 1 x daily - 7 x weekly - 3 sets - 10 reps - green band hold   Treatment priorities     Eval (12/03/2022)              Manual/TDN                                                                              ASSESSMENT:   CLINICAL IMPRESSION: Pt was able to complete prescribed exercises with no adverse effect. Responded well to TPDN and manual, noting decrease in pain post session. Will continue per POC.    OBJECTIVE IMPAIRMENTS: Pain, cervical ROM   ACTIVITY LIMITATIONS: work, driving, reading   PERSONAL FACTORS: See medical  history and pertinent history       GOALS:     SHORT TERM GOALS: Target date: 12/31/2022   Darnika will be >75% HEP compliant to improve carryover between sessions and facilitate independent management of condition   Evaluation (12/03/2022): ongoing Goal status: INITIAL     LONG TERM GOALS: Target date: 01/28/2023   Iyiana will improve FOTO score to 66 as a proxy for functional improvement   Evaluation/Baseline (12/03/2022): 58 Goal status: INITIAL     2.  Daniyah will self report >/= 50% decrease in pain from evaluation    Evaluation/Baseline (12/03/2022): 10/10 max pain Goal status: INITIAL     3.  Levy will report confidence in self management of condition at time of discharge with advanced HEP   Evaluation/Baseline (12/03/2022): unable to self manage Goal status: INITIAL   4.  Solae will be able to drive and work, not limited by pain   Evaluation/Baseline (12/03/2022): limited Goal status: INITIAL       PLAN: PT FREQUENCY: 1-2x/week   PT DURATION: 8 weeks (Ending 01/28/2023)   PLANNED INTERVENTIONS: Therapeutic exercises, Aquatic therapy, Therapeutic activity, Neuro Muscular re-education, Gait training, Patient/Family education, Joint mobilization, Dry Needling, Electrical stimulation, Spinal  mobilization and/or manipulation, Moist heat, Taping, Vasopneumatic device, Ionotophoresis 4mg /ml Dexamethasone, and Manual therapy   PLAN FOR NEXT SESSION: TDN, manual, test DNF endurance   Ward Chatters, PT 12/24/2022, 9:58 AM

## 2022-12-24 ENCOUNTER — Ambulatory Visit: Payer: BC Managed Care – PPO

## 2022-12-24 DIAGNOSIS — M542 Cervicalgia: Secondary | ICD-10-CM | POA: Diagnosis not present

## 2022-12-28 ENCOUNTER — Ambulatory Visit: Payer: BC Managed Care – PPO

## 2022-12-28 DIAGNOSIS — M542 Cervicalgia: Secondary | ICD-10-CM | POA: Diagnosis not present

## 2022-12-28 NOTE — Therapy (Signed)
OUTPATIENT PHYSICAL THERAPY TREATMENT NOTE   Patient Name: Linda Stewart MRN: [id number] DOB:12-01-1970, 52 y.o., female Today's Date: 12/28/2022  PCP: Cassandria Anger, MD  REFERRING PROVIDER: Cassandria Anger, MD   END OF SESSION:   PT End of Session - 12/28/22 0912     Visit Number 4    Number of Visits --   1-2x/week   Date for PT Re-Evaluation 01/27/23    Authorization Type BCBS    PT Start Time 0915    PT Stop Time 0953    PT Time Calculation (min) 38 min    Activity Tolerance Patient tolerated treatment well    Behavior During Therapy [facility] for tasks assessed/performed               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[place])    Endometriosis determined by laparoscopy 02/28/2016   Headache(784.0)    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   History of cerebral aneurysm repair dx 10-18-2006 via MRI/MRA  5.110mm x 4.55mm saccular   11-16-2006  stent-assisted coiling LICA intracranial superior hypophyseal region   History of CVA (cerebrovascular accident)    2003   History of ovarian cyst    Pelvic pain in female 12/05/2015   Uterine polyp 12/05/2015   Past Surgical History:  Procedure Laterality Date   HYSTEROSCOPY WITH D & C N/A 02/28/2016   Procedure: DILATATION AND CURETTAGE /DIAGNOSTIC HYSTEROSCOPY;  Surgeon: Janyth Contes, MD;  Location: [place];  Service: Gynecology;  Laterality: N/A;   LAPAROSCOPIC OVARIAN CYSTECTOMY  2001   LAPAROSCOPY N/A 02/28/2016   Procedure: OPERATIVE LAPAROSCOPY; LYSIS OF ADHESIONS;  Surgeon: Janyth Contes, MD;  Location: [place];  Service: Gynecology;  Laterality: N/A;   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  11-16-2006   dr deveshwar   coiling  (general anesthesia)   Patient Active Problem List   Diagnosis Date Noted   Cervical radiculitis 11/16/2022   Cervical  pain (neck) 10/28/2022   Abnormal cervical Papanicolaou smear 08/24/2022   Aneurysm ([place]) 08/24/2022   Arnold-Chiari malformation ([place]) 08/24/2022   Dysmenorrhea 08/24/2022   Perimenopausal disorder 08/24/2022   Mood disorder ([place]) 08/24/2022   Vitamin D deficiency 11/12/2020   Vaginitis 11/12/2020   Abnormal uterine bleeding 10/03/2020   Menorrhagia 10/03/2020   Migraine with aura and without status migrainosus, not intractable 04/16/2020   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   Right knee pain 09/18/2019   Abdominal pain 09/06/2019   Weight gain 09/06/2019   Hyperthyroidism 03/25/2017   Left wrist pain 02/26/2017   Cramps, extremity 12/10/2016   Loss of weight 08/17/2016   Endometriosis determined by laparoscopy 02/28/2016   Pelvic pain in female 12/05/2015   Uterine polyp 12/05/2015   Rectal pain 12/06/2013   LLQ abdominal pain 10/19/2013   Graves disease 10/18/2013   Hematuria 10/18/2013   Paresthesia 11/25/2011   B12 deficiency 11/25/2011   Well adult exam 07/17/2011   Neoplasm of uncertain behavior of skin 07/17/2011   PNEUMONIA 07/03/2010   Headache 06/04/2010   TORTICOLLIS, SPASMODIC 04/02/2010   NECK PAIN, LEFT 04/02/2010   Fatigue 06/03/2009   Low back pain 03/08/2009   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   Brain aneurysm 06/03/2007    REFERRING DIAG: Cervical pain (neck) [M54.2], Cervical radiculitis [M54.12]   THERAPY DIAG:  Cervicalgia  Rationale for Evaluation and Treatment Rehabilitation  PERTINENT HISTORY: Graves disease, migraine, aneurism   PRECAUTIONS: None  SUBJECTIVE:                                                                                                                                                                                      SUBJECTIVE STATEMENT:  Pt presents to PT with reports of decreased pain. Has been compliant with HEP.   PAIN:  Are you having pain? Yes Pain location: R sided neck pain NPRS scale:  5/10  to 10/10 Aggravating factors: stress, rotation of neck Relieving factors: rest, heat Pain description: constant Stage: Chronic Stability: staying the same 24 hour pattern: worse with activity    OBJECTIVE: (objective measures completed at initial evaluation unless otherwise dated)  DIAGNOSTIC FINDINGS:  Degenerative disease with disc height loss at C5-6 and C6-7. Bilateral facet arthropathy at C7-T1. Right uncovertebral degenerative changes C5-6 with foraminal encroachment. Left neural foramina are patent.   IMPRESSION: 1. Cervical spine spondylosis as described above. 2. No acute osseous injury of the cervical spine.   GENERAL OBSERVATION:          Forward head                               SENSATION:          Light touch: Appears intact           PALPATION: TTP proximal LS, and scalenes   Cervical ROM   ROM ROM  12/03/2022  Flexion 60  Extension 40  Right lateral flexion 30  Left lateral flexion 30*  Right rotation 52  Left rotation 55  Flexion rotation (normal is 30 degrees)    Flexion rotation (normal is 30 degrees)      (Blank rows = not tested, N = WNL, * = concordant pain)   UPPER EXTREMITY MMT:   MMT Right 12/03/2022 Left 12/03/2022  Shoulder flexion n n  Shoulder abduction (C5) n n  Shoulder ER n n  Shoulder IR      Middle trapezius      Lower trapezius      Shoulder extension      Grip strength      Shoulder shrug (C4)      Elbow flexion (C6)      Elbow ext (C7)      Thumb ext (C8)      Finger abd (T1)      Grossly        (Blank rows = not tested, score listed is out of 5 possible points.  N = WNL, D = diminished, C = clear for gross weakness with myotome testing, * = concordant pain with testing)  SPECIAL TESTS:           Spurling's (-)   JOINT MOBILITY TESTING:  Guarded with joint mobility testing    PATIENT SURVEYS:  FOTO 58 -> 66              TODAY'S TREATMENT:  [facility] Adult PT Treatment:                                                 DATE: 12/28/2022  Therapeutic Exercise: UBE lvl 1.5 x 3 min while taking subjective  Supine chin tuck x 10  Row 2x10 13# Seated lat pulldown 2x10 20# Seated bilateral ER 3x10 RTB Seated horizontal abd 2x10 RTB Manual Therapy: Skilled palpation for TPDN Suboccipital release STM to R upper trap, R suboccipitals Positional release to R upper trap Modalites MHP to cervical spine x 10 min post session   Trigger Point Dry Needling Treatment: Pre-treatment instruction: Patient instructed on dry needling rationale, procedures, and possible side effects including pain during treatment (achy,cramping feeling), bruising, drop of blood, lightheadedness, nausea, sweating. Patient Consent Given: Yes Education handout provided: No Muscles treated: R upper trap  Needle size and number: .30x74mm x 4 Electrical stimulation performed: No Parameters: N/A Treatment response/outcome: Twitch response elicited and Palpable decrease in muscle tension Post-treatment instructions: Patient instructed to expect possible mild to moderate muscle soreness later today and/or tomorrow. Patient instructed in methods to reduce muscle soreness and to continue prescribed HEP. If patient was dry needled over the lung field, patient was instructed on signs and symptoms of pneumothorax and, however unlikely, to see immediate medical attention should they occur. Patient was also educated on signs and symptoms of infection and to seek medical attention should they occur. Patient verbalized understanding of these instructions and education.    PATIENT EDUCATION:  POC, diagnosis, prognosis, HEP, and outcome measures.  Pt educated via explanation, demonstration, and handout (HEP).  Pt confirms understanding verbally.             HOME EXERCISE PROGRAM: Access Code: [email or web address] URL: [email or web address] Date: 12/21/2022 Prepared by: Octavio Manns  Exercises - Supine Chin Tuck  - 2 x daily - 7 x weekly - 3 sets  - 10 reps - Seated Assisted Cervical Rotation with Towel  - 3 x daily - 7 x weekly - 2 sets - 15 reps - Standing Shoulder Row with Anchored Resistance  - 1 x daily - 7 x weekly - 3 sets - 10 reps - green band hold   Treatment priorities     Eval (12/03/2022)              Manual/TDN                                                                              ASSESSMENT:   CLINICAL IMPRESSION: Pt was able to complete prescribed exercises with no adverse effect. Responded well to TPDN and manual, noting decrease in pain post session. Will continue per POC.    OBJECTIVE IMPAIRMENTS: Pain, cervical ROM   ACTIVITY LIMITATIONS: work, driving, reading  PERSONAL FACTORS: See medical history and pertinent history       GOALS:     SHORT TERM GOALS: Target date: 12/31/2022   Morag will be >75% HEP compliant to improve carryover between sessions and facilitate independent management of condition   Evaluation (12/03/2022): ongoing Goal status: INITIAL     LONG TERM GOALS: Target date: 01/28/2023   Kissie will improve FOTO score to 66 as a proxy for functional improvement   Evaluation/Baseline (12/03/2022): 58 Goal status: INITIAL     2.  Kenzlie will self report >/= 50% decrease in pain from evaluation    Evaluation/Baseline (12/03/2022): 10/10 max pain Goal status: INITIAL     3.  Tenny will report confidence in self management of condition at time of discharge with advanced HEP   Evaluation/Baseline (12/03/2022): unable to self manage Goal status: INITIAL   4.  Debrianna will be able to drive and work, not limited by pain   Evaluation/Baseline (12/03/2022): limited Goal status: INITIAL       PLAN: PT FREQUENCY: 1-2x/week   PT DURATION: 8 weeks (Ending 01/28/2023)   PLANNED INTERVENTIONS: Therapeutic exercises, Aquatic therapy, Therapeutic activity, Neuro Muscular re-education, Gait training, Patient/Family education, Joint mobilization, Dry Needling, Electrical  stimulation, Spinal mobilization and/or manipulation, Moist heat, Taping, Vasopneumatic device, Ionotophoresis 4mg /ml Dexamethasone, and Manual therapy   PLAN FOR NEXT SESSION: TDN, manual, test DNF endurance   Ward Chatters, PT 12/28/2022, 9:56 AM

## 2022-12-30 ENCOUNTER — Ambulatory Visit: Payer: BC Managed Care – PPO | Admitting: Psychology

## 2022-12-30 DIAGNOSIS — F4323 Adjustment disorder with mixed anxiety and depressed mood: Secondary | ICD-10-CM

## 2022-12-30 NOTE — Progress Notes (Signed)
[facility] Counselor/Therapist Progress Note  Patient ID: Linda Stewart, MRN: [id number],    Date: 12/30/2022  Time Spent: 45 mins  Treatment Type: Individual Therapy  Reported Symptoms: Pt presents for session via WebEx video.  Pt grants consent for session, stating she is in her car with no one else present.  I shared with pt that I am in my office with no one else here either.  Mental Status Exam: Appearance:  Casual     Behavior: Appropriate  Motor: Normal  Speech/Language:  Clear and Coherent  Affect: Appropriate  Mood: normal  Thought process: normal  Thought content:   WNL  Sensory/Perceptual disturbances:   WNL  Orientation: oriented to person, place, and time/date  Attention: Good  Concentration: Good  Memory: WNL  Fund of knowledge:  Good  Insight:   Good  Judgment:  Good  Impulse Control: Good   Risk Assessment: Danger to Self:  No Self-injurious Behavior: No Danger to Others: No Duty to Warn:no Physical Aggression / Violence:No  Access to Firearms a concern: No  Gang Involvement:No   Subjective: Pt shares that she "has been good since our last session.  I am at home this week for Spring Break; everybody is doing pretty well."  Pt shares that she has an appt with her PCP on 4/4 to talk about increasing her Lexapro amount.  Pt shares that she has been having some frustration with a particular teacher at school who has not been getting pt the info she needs to complete a report pt is required to complete.  Pt was not happy with how she interacted with this teacher so she went to the teacher to apologize.  She did apologize to the teacher and the teacher accepted her apology.  Congratulated pt on interacting with staff in this positive way.  Pt is planning to attend church with her family this Sunday for Easter.  Pt shares that her cat  Disappeared for a day or two but she is back now and pt is happy about that.  Pt shares that she is going to a  family wedding in May and wants to talk about her concerns about that event in our follow up session.  I will also follow up with pt about her neck injury and the PT visits for that injury in our next session.  Encouraged pt to continue with her self care activities and we will meet in 2 wks for a follow up session.  Interventions: Cognitive Behavioral Therapy  Diagnosis:Adjustment disorder with mixed anxiety and depressed mood  Plan: Treatment Plan Strengths/Abilities:  Intelligent, Intuitive, Willing to participate in therapy Treatment Preferences:  Outpatient Individual Therapy Statement of Needs:  Patient is to use CBT, mindfulness and coping skills to help manage and/or decrease symptoms associated with their diagnosis. Symptoms:  Depressed/Irritable mood, worry, social withdrawal Problems Addressed:  Depressive thoughts, Sadness, Sleep issues, etc. Long Term Goals:  Pt to reduce overall level, frequency, and intensity of the feelings of depression/anxiety as evidenced by decreased irritability, negative self talk, and helpless feelings from 6 to 7 days/week to 0 to 1 days/week, per client report, for at least 3 consecutive months.  Progress: 20% Short Term Goals:  Pt to verbally express understanding of the relationship between feelings of depression/anxiety and their impact on thinking patterns and behaviors.  Pt to verbalize an understanding of the role that distorted thinking plays in creating fears, excessive worry, and ruminations.  Progress: 20% Target Date:  10/10/2023 Frequency:  Bi-weekly Modality:  Cognitive Behavioral Therapy Interventions by Therapist:  Therapist will use CBT, Mindfulness exercises, Coping skills and Referrals, as needed by client. Client has verbally approved this treatment plan.  Rees Santistevan B Quaneisha Hanisch, LCMHC 

## 2022-12-31 ENCOUNTER — Encounter: Payer: Self-pay | Admitting: Physical Therapy

## 2022-12-31 ENCOUNTER — Ambulatory Visit: Payer: BC Managed Care – PPO | Admitting: Physical Therapy

## 2022-12-31 DIAGNOSIS — M542 Cervicalgia: Secondary | ICD-10-CM | POA: Diagnosis not present

## 2022-12-31 NOTE — Therapy (Signed)
OUTPATIENT PHYSICAL THERAPY TREATMENT NOTE   Patient Name: Linda Stewart MRN: [id number] DOB:10/19/1970, 52 y.o., female Today's Date: 12/31/2022  PCP: Cassandria Anger, MD  REFERRING PROVIDER: Cassandria Anger, MD   END OF SESSION:   PT End of Session - 12/31/22 [id number]     Visit Number 5    Number of Visits --   1-2x/week   Date for PT Re-Evaluation 01/27/23    Authorization Type BCBS    PT Start Time 0920    PT Stop Time 1005    PT Time Calculation (min) 45 min    Activity Tolerance Patient tolerated treatment well    Behavior During Therapy [facility] for tasks assessed/performed               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[place])    Endometriosis determined by laparoscopy 02/28/2016   Headache(784.0)    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   History of cerebral aneurysm repair dx 10-18-2006 via MRI/MRA  5.2mm x 4.31mm saccular   11-16-2006  stent-assisted coiling LICA intracranial superior hypophyseal region   History of CVA (cerebrovascular accident)    2003   History of ovarian cyst    Pelvic pain in female 12/05/2015   Uterine polyp 12/05/2015   Past Surgical History:  Procedure Laterality Date   HYSTEROSCOPY WITH D & C N/A 02/28/2016   Procedure: DILATATION AND CURETTAGE /DIAGNOSTIC HYSTEROSCOPY;  Surgeon: Janyth Contes, MD;  Location: [place];  Service: Gynecology;  Laterality: N/A;   LAPAROSCOPIC OVARIAN CYSTECTOMY  2001   LAPAROSCOPY N/A 02/28/2016   Procedure: OPERATIVE LAPAROSCOPY; LYSIS OF ADHESIONS;  Surgeon: Janyth Contes, MD;  Location: [place];  Service: Gynecology;  Laterality: N/A;   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  11-16-2006   dr deveshwar   coiling  (general anesthesia)   Patient Active Problem List   Diagnosis Date Noted   Cervical radiculitis 11/16/2022   Cervical  pain (neck) 10/28/2022   Abnormal cervical Papanicolaou smear 08/24/2022   Aneurysm ([place]) 08/24/2022   Arnold-Chiari malformation ([place]) 08/24/2022   Dysmenorrhea 08/24/2022   Perimenopausal disorder 08/24/2022   Mood disorder ([place]) 08/24/2022   Vitamin D deficiency 11/12/2020   Vaginitis 11/12/2020   Abnormal uterine bleeding 10/03/2020   Menorrhagia 10/03/2020   Migraine with aura and without status migrainosus, not intractable 04/16/2020   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   Right knee pain 09/18/2019   Abdominal pain 09/06/2019   Weight gain 09/06/2019   Hyperthyroidism 03/25/2017   Left wrist pain 02/26/2017   Cramps, extremity 12/10/2016   Loss of weight 08/17/2016   Endometriosis determined by laparoscopy 02/28/2016   Pelvic pain in female 12/05/2015   Uterine polyp 12/05/2015   Rectal pain 12/06/2013   LLQ abdominal pain 10/19/2013   Graves disease 10/18/2013   Hematuria 10/18/2013   Paresthesia 11/25/2011   B12 deficiency 11/25/2011   Well adult exam 07/17/2011   Neoplasm of uncertain behavior of skin 07/17/2011   PNEUMONIA 07/03/2010   Headache 06/04/2010   TORTICOLLIS, SPASMODIC 04/02/2010   NECK PAIN, LEFT 04/02/2010   Fatigue 06/03/2009   Low back pain 03/08/2009   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   Brain aneurysm 06/03/2007    REFERRING DIAG: Cervical pain (neck) [M54.2], Cervical radiculitis [M54.12]   THERAPY DIAG:  Cervicalgia  Rationale for Evaluation and Treatment Rehabilitation  PERTINENT HISTORY: Graves disease, migraine, aneurism   PRECAUTIONS: None  SUBJECTIVE:                                                                                                                                                                                      SUBJECTIVE STATEMENT:  Pt reports that she sees some improvement.  She feels TDN is somewhat helpful.   PAIN:  Are you having pain? Yes Pain location: R sided neck pain NPRS scale:  4/10  to 10/10 Aggravating factors: stress, rotation of neck Relieving factors: rest, heat Pain description: constant Stage: Chronic Stability: staying the same 24 hour pattern: worse with activity    OBJECTIVE: (objective measures completed at initial evaluation unless otherwise dated)  DIAGNOSTIC FINDINGS:  Degenerative disease with disc height loss at C5-6 and C6-7. Bilateral facet arthropathy at C7-T1. Right uncovertebral degenerative changes C5-6 with foraminal encroachment. Left neural foramina are patent.   IMPRESSION: 1. Cervical spine spondylosis as described above. 2. No acute osseous injury of the cervical spine.   GENERAL OBSERVATION:          Forward head                               SENSATION:          Light touch: Appears intact           PALPATION: TTP proximal LS, and scalenes   Cervical ROM   ROM ROM  12/03/2022  Flexion 60  Extension 40  Right lateral flexion 30  Left lateral flexion 30*  Right rotation 52  Left rotation 55  Flexion rotation (normal is 30 degrees)    Flexion rotation (normal is 30 degrees)      (Blank rows = not tested, N = WNL, * = concordant pain)   UPPER EXTREMITY MMT:   MMT Right 12/03/2022 Left 12/03/2022  Shoulder flexion n n  Shoulder abduction (C5) n n  Shoulder ER n n  Shoulder IR      Middle trapezius      Lower trapezius      Shoulder extension      Grip strength      Shoulder shrug (C4)      Elbow flexion (C6)      Elbow ext (C7)      Thumb ext (C8)      Finger abd (T1)      Grossly        (Blank rows = not tested, score listed is out of 5 possible points.  N = WNL, D = diminished, C = clear for gross weakness with myotome testing, * = concordant pain with testing)  SPECIAL TESTS:           Spurling's (-)   JOINT MOBILITY TESTING:  Guarded with joint mobility testing    PATIENT SURVEYS:  FOTO 58 -> 66              TODAY'S TREATMENT:  [facility] Adult PT Treatment:                                                 DATE: 12/31/2022  Therapeutic Exercise: UBE lvl 1.5 x 3 min while taking subjective  Foam roller routine for thoracic mobility Childs pose with thread the needle Table top position with + Seated lat pulldown 2x10 20# (not today) Bil 90 90 OH press to W on wall - 2# 3x10 Manual Therapy: Skilled palpation for TPDN Trigger Point Dry Needling Treatment: Pre-treatment instruction: Patient instructed on dry needling rationale, procedures, and possible side effects including pain during treatment (achy,cramping feeling), bruising, drop of blood, lightheadedness, nausea, sweating. Patient Consent Given: Yes Education handout provided: No Muscles treated: R upper trap and R sub occipitals Needle size and number: .30x22mm x 2 Electrical stimulation performed: No Parameters: N/A Treatment response/outcome: Twitch response elicited and Palpable decrease in muscle tension Post-treatment instructions: Patient instructed to expect possible mild to moderate muscle soreness later today and/or tomorrow. Patient instructed in methods to reduce muscle soreness and to continue prescribed HEP. If patient was dry needled over the lung field, patient was instructed on signs and symptoms of pneumothorax and, however unlikely, to see immediate medical attention should they occur. Patient was also educated on signs and symptoms of infection and to seek medical attention should they occur. Patient verbalized understanding of these instructions and education.    PATIENT EDUCATION:  POC, diagnosis, prognosis, HEP, and outcome measures.  Pt educated via explanation, demonstration, and handout (HEP).  Pt confirms understanding verbally.             HOME EXERCISE PROGRAM: Access Code: [email or web address] URL: [email or web address] Date: 12/21/2022 Prepared by: Octavio Manns  Exercises - Supine Chin Tuck  - 2 x daily - 7 x weekly - 3 sets - 10 reps - Seated Assisted Cervical Rotation with Towel  - 3 x daily -  7 x weekly - 2 sets - 15 reps - Standing Shoulder Row with Anchored Resistance  - 1 x daily - 7 x weekly - 3 sets - 10 reps - green band hold   Treatment priorities     Eval (12/03/2022)              Manual/TDN                                                                              ASSESSMENT:   CLINICAL IMPRESSION: Pt was able to complete prescribed exercises with no adverse effect. Responded well to TPDN and manual, noting decrease in pain post session. Will continue per POC.    OBJECTIVE IMPAIRMENTS: Pain, cervical ROM   ACTIVITY LIMITATIONS: work, driving, reading   PERSONAL FACTORS: See medical history and pertinent history  GOALS:     SHORT TERM GOALS: Target date: 12/31/2022   [place] will be >75% HEP compliant to improve carryover between sessions and facilitate independent management of condition   Evaluation (12/03/2022): ongoing Goal status: INITIAL     LONG TERM GOALS: Target date: 01/28/2023   Lore will improve FOTO score to 66 as a proxy for functional improvement   Evaluation/Baseline (12/03/2022): 58 Goal status: INITIAL     2.  Viyana will self report >/= 50% decrease in pain from evaluation    Evaluation/Baseline (12/03/2022): 10/10 max pain Goal status: INITIAL     3.  Lynnix will report confidence in self management of condition at time of discharge with advanced HEP   Evaluation/Baseline (12/03/2022): unable to self manage Goal status: INITIAL   4.  [place] will be able to drive and work, not limited by pain   Evaluation/Baseline (12/03/2022): limited Goal status: INITIAL       PLAN: PT FREQUENCY: 1-2x/week   PT DURATION: 8 weeks (Ending 01/28/2023)   PLANNED INTERVENTIONS: Therapeutic exercises, Aquatic therapy, Therapeutic activity, Neuro Muscular re-education, Gait training, Patient/Family education, Joint mobilization, Dry Needling, Electrical stimulation, Spinal mobilization and/or manipulation, Moist heat, Taping,  Vasopneumatic device, Ionotophoresis 4mg /ml Dexamethasone, and Manual therapy   PLAN FOR NEXT SESSION: TDN, manual, test DNF endurance   Mathis Dad, PT 12/31/2022, 10:08 AM

## 2023-01-05 ENCOUNTER — Encounter: Payer: Self-pay | Admitting: Internal Medicine

## 2023-01-05 ENCOUNTER — Ambulatory Visit (INDEPENDENT_AMBULATORY_CARE_PROVIDER_SITE_OTHER): Payer: BC Managed Care – PPO | Admitting: Internal Medicine

## 2023-01-05 VITALS — BP 118/78 | HR 70 | Temp 98.3°F | Ht 63.0 in | Wt 153.0 lb

## 2023-01-05 DIAGNOSIS — M542 Cervicalgia: Secondary | ICD-10-CM

## 2023-01-05 DIAGNOSIS — Z0000 Encounter for general adult medical examination without abnormal findings: Secondary | ICD-10-CM | POA: Diagnosis not present

## 2023-01-05 DIAGNOSIS — M5412 Radiculopathy, cervical region: Secondary | ICD-10-CM

## 2023-01-05 DIAGNOSIS — E890 Postprocedural hypothyroidism: Secondary | ICD-10-CM

## 2023-01-05 DIAGNOSIS — E538 Deficiency of other specified B group vitamins: Secondary | ICD-10-CM

## 2023-01-05 DIAGNOSIS — E559 Vitamin D deficiency, unspecified: Secondary | ICD-10-CM

## 2023-01-05 DIAGNOSIS — R5383 Other fatigue: Secondary | ICD-10-CM

## 2023-01-05 MED ORDER — ESCITALOPRAM OXALATE 10 MG PO TABS: 10.0000 mg | ORAL_TABLET | Freq: Every day | ORAL | 1 refills | Status: AC

## 2023-01-05 MED ORDER — TRIAMCINOLONE ACETONIDE 0.5 % EX CREA: 1.0000 | TOPICAL_CREAM | Freq: Three times a day (TID) | CUTANEOUS | 1 refills | Status: AC | PRN

## 2023-01-05 NOTE — Assessment & Plan Note (Signed)
On B12 

## 2023-01-05 NOTE — Assessment & Plan Note (Signed)
On Vit D 

## 2023-01-05 NOTE — Assessment & Plan Note (Signed)
Chronic Increase Lexapro 10 mg/d

## 2023-01-05 NOTE — Progress Notes (Signed)
Subjective:  Patient ID: Linda Stewart, female    DOB: 07-24-1971  Age: 52 y.o. MRN: [id number]  CC: Annual Exam (ON GOING RT SIDE OF NECK PAIN AND STIFFNESS)   HPI Linda Stewart presents for a well exam C/o neck pain - not better; in PT. Pt has not had an MRI yet. R>L neck pain F/u anxiety - better; was recommended to increase Lexapro  Outpatient Medications Prior to Visit  Medication Sig Dispense Refill   ALPRAZolam (XANAX) 0.25 MG tablet Take 1-2 tablets (0.25-0.5 mg total) by mouth 2 (two) times daily as needed for anxiety. 60 tablet 1   aspirin 325 MG EC tablet Take 325 mg by mouth daily.     benzonatate (TESSALON) 100 MG capsule Take 1 capsule (100 mg total) by mouth 3 (three) times daily as needed for cough. 20 capsule 0   brompheniramine-pseudoephedrine-DM 30-2-10 MG/5ML syrup Take 5 mLs by mouth 4 (four) times daily as needed. 120 mL 0   Cholecalciferol (VITAMIN D3) 2000 units TABS Take 2,000 Units by mouth daily.      Cyanocobalamin (VITAMIN B-12) 500 MCG SUBL Place 1 tablet (500 mcg total) under the tongue 1 day or 1 dose. 100 tablet 3   cyclobenzaprine (FLEXERIL) 5 MG tablet Take 1 tablet (5 mg total) by mouth 3 (three) times daily as needed for muscle spasms. 30 tablet 1   fluticasone (FLONASE) 50 MCG/ACT nasal spray Place 2 sprays into both nostrils daily. 16 g 0   levothyroxine (SYNTHROID) 125 MCG tablet Take 1 tablet (125 mcg total) by mouth daily. 90 tablet 3   traMADol (ULTRAM) 50 MG tablet Take 1 tablet (50 mg total) by mouth every 6 (six) hours as needed. 20 tablet 1   Ubrogepant (UBRELVY) 100 MG TABS Take 1 tablet by mouth daily as needed. 16 tablet 2   escitalopram (LEXAPRO) 5 MG tablet Take 1 tablet (5 mg total) by mouth daily. 30 tablet 5   methylPREDNISolone (MEDROL DOSEPAK) 4 MG TBPK tablet As directed 21 tablet 0   No facility-administered medications prior to visit.    ROS: Review of Systems  Constitutional:  Negative for activity change, appetite  change, chills, fatigue and unexpected weight change.  HENT:  Negative for congestion, mouth sores and sinus pressure.   Eyes:  Negative for visual disturbance.  Respiratory:  Negative for cough and chest tightness.   Gastrointestinal:  Negative for abdominal pain and nausea.  Genitourinary:  Negative for difficulty urinating, frequency and vaginal pain.  Musculoskeletal:  Positive for neck pain and neck stiffness. Negative for back pain and gait problem.  Skin:  Negative for pallor and rash.  Neurological:  Negative for dizziness, tremors, weakness, numbness and headaches.  Psychiatric/Behavioral:  Negative for confusion and sleep disturbance. The patient is nervous/anxious.     Objective:  BP 118/78 (BP Location: Right Arm, Patient Position: Sitting, Cuff Size: Large)   Pulse 70   Temp 98.3 F (36.8 C) (Oral)   Ht 5\' 3"  (1.6 m)   Wt 153 lb (69.4 kg)   SpO2 98%   BMI 27.10 kg/m   BP Readings from Last 3 Encounters:  01/05/23 118/78  11/16/22 124/72  10/28/22 110/84    Wt Readings from Last 3 Encounters:  01/05/23 153 lb (69.4 kg)  11/16/22 153 lb (69.4 kg)  10/28/22 153 lb (69.4 kg)    Physical Exam Constitutional:      General: She is not in acute distress.    Appearance: She is  well-developed.  HENT:     Head: Normocephalic.     Right Ear: External ear normal.     Left Ear: External ear normal.     Nose: Nose normal.  Eyes:     General:        Right eye: No discharge.        Left eye: No discharge.     Conjunctiva/sclera: Conjunctivae normal.     Pupils: Pupils are equal, round, and reactive to light.  Neck:     Thyroid: No thyromegaly.     Vascular: No JVD.     Trachea: No tracheal deviation.  Cardiovascular:     Rate and Rhythm: Normal rate and regular rhythm.     Heart sounds: Normal heart sounds.  Pulmonary:     Effort: No respiratory distress.     Breath sounds: No stridor. No wheezing.  Abdominal:     General: Bowel sounds are normal. There is no  distension.     Palpations: Abdomen is soft. There is no mass.     Tenderness: There is no abdominal tenderness. There is no guarding or rebound.  Musculoskeletal:        General: No tenderness.     Cervical back: Normal range of motion and neck supple. No rigidity.  Lymphadenopathy:     Cervical: No cervical adenopathy.  Skin:    Findings: No erythema or rash.  Neurological:     Cranial Nerves: No cranial nerve deficit.     Motor: No abnormal muscle tone.     Coordination: Coordination normal.     Deep Tendon Reflexes: Reflexes normal.  Psychiatric:        Behavior: Behavior normal.        Thought Content: Thought content normal.        Judgment: Judgment normal.     Lab Results  Component Value Date   WBC 3.6 (L) 01/08/2023   HGB 12.0 01/08/2023   HCT 37.0 01/08/2023   PLT 266.0 01/08/2023   GLUCOSE 94 01/08/2023   CHOL 211 (H) 01/08/2023   TRIG 83.0 01/08/2023   HDL 63.10 01/08/2023   LDLCALC 131 (H) 01/08/2023   ALT 11 01/08/2023   AST 16 01/08/2023   NA 139 01/08/2023   K 3.9 01/08/2023   CL 103 01/08/2023   CREATININE 0.79 01/08/2023   BUN 19 01/08/2023   CO2 29 01/08/2023   TSH 0.91 01/08/2023   INR 1.0 04/12/2020    MR ANGIO HEAD WO CONTRAST  Result Date: 03/16/2022 CLINICAL DATA:  Provided history: Aneurysm. EXAM: MRA HEAD WITHOUT CONTRAST TECHNIQUE: Angiographic images of the Circle of Willis were acquired using MRA technique without intravenous contrast. COMPARISON:  Prior MRA head examinations 01/06/2021 and earlier. FINDINGS: Anterior circulation: The intracranial internal carotid arteries are patent. As before, stent artifact limits evaluation for stenoses within the cavernous/paraclinoid left ICA. The M1 middle cerebral arteries are patent. No M2 proximal branch occlusion or high-grade proximal stenosis. The anterior cerebral arteries are patent. 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 No new intracranial aneurysm is identified. Posterior circulation: The intracranial vertebral arteries are patent. The basilar artery is patent. The posterior cerebral arteries are patent. Posterior communicating arteries are present bilaterally. Anatomic variants: None significant. IMPRESSION: Unchanged appearance of the treated superior hypophyseal region left ICA aneurysm with unchanged small neck remnant. Electronically Signed   By: Jackey Loge D.O.   On: 03/16/2022 09:35  Assessment & Plan:   Problem List Items Addressed This Visit       Endocrine   Postablative hypothyroidism   Relevant Orders   TSH (Completed)   T4, free (Completed)     Nervous and Auditory   Cervical radiculitis   Relevant Medications   escitalopram (LEXAPRO) 10 MG tablet     Other   B12 deficiency    On B12      Cervical pain (neck)    Not better; in PT. Pt has not had an MRI yet. R>L neck pain Re-order MRI      Relevant Orders   MR Cervical Spine Wo Contrast   Fatigue    Chronic Increase Lexapro 10 mg/d      Vitamin D deficiency    On Vit D      Relevant Orders   MR Cervical Spine Wo Contrast   Well adult exam - Primary   Relevant Orders   TSH (Completed)   Urinalysis (Completed)   CBC with Differential/Platelet (Completed)   Lipid panel (Completed)   Comprehensive metabolic panel (Completed)   T4, free (Completed)      Meds ordered this encounter  Medications   escitalopram (LEXAPRO) 10 MG tablet    Sig: Take 1 tablet (10 mg total) by mouth daily.    Dispense:  90 tablet    Refill:  1   triamcinolone cream (KENALOG) 0.5 %    Sig: Apply 1 Application topically 3 (three) times daily as needed.    Dispense:  30 g    Refill:  1      Follow-up: No follow-ups on file.  Sonda Primes, MD

## 2023-01-05 NOTE — Assessment & Plan Note (Signed)
Not better; in PT. Pt has not had an MRI yet. R>L neck pain Re-order MRI

## 2023-01-06 ENCOUNTER — Encounter: Payer: Self-pay | Admitting: Physical Therapy

## 2023-01-06 ENCOUNTER — Ambulatory Visit: Payer: BC Managed Care – PPO | Attending: Internal Medicine | Admitting: Physical Therapy

## 2023-01-06 DIAGNOSIS — M542 Cervicalgia: Secondary | ICD-10-CM | POA: Diagnosis present

## 2023-01-06 NOTE — Therapy (Signed)
OUTPATIENT PHYSICAL THERAPY TREATMENT NOTE   Patient Name: Linda Stewart MRN: [id number] DOB:1971-09-29, 52 y.o., female Today's Date: 01/07/2023  PCP: Cassandria Anger, MD  REFERRING PROVIDER: Cassandria Anger, MD   END OF SESSION:   PT End of Session - 01/06/23 1533     Visit Number 6    Number of Visits --   1-2x/week   Date for PT Re-Evaluation 01/27/23    Authorization Type [employer]    PT Start Time 1533    PT Stop Time 1613    PT Time Calculation (min) 40 min    Activity Tolerance Patient tolerated treatment well    Behavior During Therapy [facility] for tasks assessed/performed               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   History of cerebral aneurysm repair dx 10-18-2006 via MRI/MRA  5.10mm x 4.41mm saccular   11-16-2006  stent-assisted coiling LICA intracranial superior hypophyseal region   History of CVA (cerebrovascular accident)    2003   History of ovarian cyst    Pelvic pain in female 12/05/2015   Uterine polyp 12/05/2015   Past Surgical History:  Procedure Laterality Date   HYSTEROSCOPY WITH D & C N/A 02/28/2016   Procedure: DILATATION AND CURETTAGE /DIAGNOSTIC HYSTEROSCOPY;  Surgeon: Janyth Contes, MD;  Location: [facility];  Service: Gynecology;  Laterality: N/A;   LAPAROSCOPIC OVARIAN CYSTECTOMY  2001   LAPAROSCOPY N/A 02/28/2016   Procedure: OPERATIVE LAPAROSCOPY; LYSIS OF ADHESIONS;  Surgeon: Janyth Contes, MD;  Location: [place];  Service: Gynecology;  Laterality: N/A;   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  11-16-2006   dr deveshwar   coiling  (general anesthesia)   Patient Active Problem List   Diagnosis Date Noted   Cervical radiculitis 11/16/2022   Cervical pain  (neck) 10/28/2022   Abnormal cervical Papanicolaou smear 08/24/2022   Aneurysm 08/24/2022   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   Dysmenorrhea 08/24/2022   Perimenopausal disorder 08/24/2022   Mood disorder 08/24/2022   Vitamin D deficiency 11/12/2020   Vaginitis 11/12/2020   Abnormal uterine bleeding 10/03/2020   Menorrhagia 10/03/2020   Migraine with aura and without status migrainosus, not intractable 04/16/2020   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   Right knee pain 09/18/2019   Abdominal pain 09/06/2019   Weight gain 09/06/2019   Hyperthyroidism 03/25/2017   Left wrist pain 02/26/2017   Cramps, extremity 12/10/2016   Loss of weight 08/17/2016   Endometriosis determined by laparoscopy 02/28/2016   Pelvic pain in female 12/05/2015   Uterine polyp 12/05/2015   Rectal pain 12/06/2013   LLQ abdominal pain 10/19/2013   Graves disease 10/18/2013   Hematuria 10/18/2013   Paresthesia 11/25/2011   B12 deficiency 11/25/2011   Well adult exam 07/17/2011   Neoplasm of uncertain behavior of skin 07/17/2011   PNEUMONIA 07/03/2010   Headache 06/04/2010   TORTICOLLIS, SPASMODIC 04/02/2010   NECK PAIN, LEFT 04/02/2010   Fatigue 06/03/2009   Low back pain 03/08/2009   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   Brain aneurysm 06/03/2007    REFERRING DIAG: Cervical pain (neck) [M54.2], Cervical radiculitis [M54.12]   THERAPY DIAG:  Cervicalgia  Rationale for Evaluation and Treatment Rehabilitation  PERTINENT HISTORY: Graves disease, migraine, aneurism   PRECAUTIONS: None   SUBJECTIVE:  SUBJECTIVE STATEMENT:  Pt reports that she has periods of higher pain with no clear trigger.  She will undergo an MRI shortly.   PAIN:  Are you having pain? Yes Pain location: R sided neck pain NPRS scale:   4/10 to 10/10 Aggravating factors: stress, rotation of neck Relieving factors: rest, heat Pain description: constant Stage: Chronic Stability: staying the same 24 hour pattern: worse with activity    OBJECTIVE: (objective measures completed at initial evaluation unless otherwise dated)  DIAGNOSTIC FINDINGS:  Degenerative disease with disc height loss at C5-6 and C6-7. Bilateral facet arthropathy at C7-T1. Right uncovertebral degenerative changes C5-6 with foraminal encroachment. Left neural foramina are patent.   IMPRESSION: 1. Cervical spine spondylosis as described above. 2. No acute osseous injury of the cervical spine.   GENERAL OBSERVATION:          Forward head                               SENSATION:          Light touch: Appears intact           PALPATION: TTP proximal LS, and scalenes   Cervical ROM   ROM ROM  12/03/2022  Flexion 60  Extension 40  Right lateral flexion 30  Left lateral flexion 30*  Right rotation 52  Left rotation 55  Flexion rotation (normal is 30 degrees)    Flexion rotation (normal is 30 degrees)      (Blank rows = not tested, N = WNL, * = concordant pain)   UPPER EXTREMITY MMT:   MMT Right 12/03/2022 Left 12/03/2022  Shoulder flexion n n  Shoulder abduction (C5) n n  Shoulder ER n n  Shoulder IR      Middle trapezius      Lower trapezius      Shoulder extension      Grip strength      Shoulder shrug (C4)      Elbow flexion (C6)      Elbow ext (C7)      Thumb ext (C8)      Finger abd (T1)      Grossly        (Blank rows = not tested, score listed is out of 5 possible points.  N = WNL, D = diminished, C = clear for gross weakness with myotome testing, * = concordant pain with testing)     SPECIAL TESTS:           Spurling's (-)   JOINT MOBILITY TESTING:  Guarded with joint mobility testing    PATIENT SURVEYS:  FOTO 58 -> 66              TODAY'S TREATMENT:  [facility] Adult PT Treatment:                                                 DATE: 01/07/2023  Therapeutic Exercise: UBE lvl 1.5 x 3 min while taking subjective  Bird dog - 20x w/ chin tuck Cervical gravity resisted ROM - 15x flexion/ext/sidebend over edge of table SCM stretch - d/c d/t lower Cx pain Manual Therapy: Skilled palpation for TPDN Trigger Point Dry Needling Treatment: Pre-treatment instruction: Patient instructed on dry needling rationale, procedures, and possible side effects including pain during treatment (achy,cramping  feeling), bruising, drop of blood, lightheadedness, nausea, sweating. Patient Consent Given: Yes Education handout provided: No Muscles treated: R sub occipitals R SCM Needle size and number: .30x75mm x 2 Electrical stimulation performed: No Parameters: N/A Treatment response/outcome: Twitch response elicited and Palpable decrease in muscle tension Post-treatment instructions: Patient instructed to expect possible mild to moderate muscle soreness later today and/or tomorrow. Patient instructed in methods to reduce muscle soreness and to continue prescribed HEP. If patient was dry needled over the lung field, patient was instructed on signs and symptoms of pneumothorax and, however unlikely, to see immediate medical attention should they occur. Patient was also educated on signs and symptoms of infection and to seek medical attention should they occur. Patient verbalized understanding of these instructions and education.    PATIENT EDUCATION:  POC, diagnosis, prognosis, HEP, and outcome measures.  Pt educated via explanation, demonstration, and handout (HEP).  Pt confirms understanding verbally.             HOME EXERCISE PROGRAM: Access Code: [email or web address] URL: [email or web address] Date: 12/21/2022 Prepared by: Octavio Manns  Exercises - Supine Chin Tuck  - 2 x daily - 7 x weekly - 3 sets - 10 reps - Seated Assisted Cervical Rotation with Towel  - 3 x daily - 7 x weekly - 2 sets - 15 reps - Standing Shoulder  Row with Anchored Resistance  - 1 x daily - 7 x weekly - 3 sets - 10 reps - green band hold   Treatment priorities     Eval (12/03/2022)              Manual/TDN                                                                              ASSESSMENT:   CLINICAL IMPRESSION: Gearlean Alf tolerated session well with no adverse reaction.  TDN of SCM today d/t pt pain complaint of lateral neck into ear which matches referral pattern of SCM.  Initiated gravity resisted strengthening of neck.   OBJECTIVE IMPAIRMENTS: Pain, cervical ROM   ACTIVITY LIMITATIONS: work, driving, reading   PERSONAL FACTORS: See medical history and pertinent history       GOALS:     SHORT TERM GOALS: Target date: 12/31/2022   Hallelujah will be >75% HEP compliant to improve carryover between sessions and facilitate independent management of condition   Evaluation (12/03/2022): ongoing Goal status: MET     LONG TERM GOALS: Target date: 01/28/2023   Aishia will improve FOTO score to 66 as a proxy for functional improvement   Evaluation/Baseline (12/03/2022): 58 Goal status: INITIAL     2.  Nakenya will self report >/= 50% decrease in pain from evaluation    Evaluation/Baseline (12/03/2022): 10/10 max pain Goal status: INITIAL     3.  Cady will report confidence in self management of condition at time of discharge with advanced HEP   Evaluation/Baseline (12/03/2022): unable to self manage Goal status: INITIAL   4.  Wally will be able to drive and work, not limited by pain   Evaluation/Baseline (12/03/2022): limited Goal status: INITIAL       PLAN: PT FREQUENCY: 1-2x/week   PT DURATION: 8 weeks (Ending 01/28/2023)  PLANNED INTERVENTIONS: Therapeutic exercises, Aquatic therapy, Therapeutic activity, Neuro Muscular re-education, Gait training, Patient/Family education, Joint mobilization, Dry Needling, Electrical stimulation, Spinal mobilization and/or manipulation, Moist heat, Taping,  Vasopneumatic device, Ionotophoresis 4mg /ml Dexamethasone, and Manual therapy   PLAN FOR NEXT SESSION: TDN, manual, test DNF endurance   Mathis Dad, PT 01/07/2023, 7:47 AM

## 2023-01-08 ENCOUNTER — Other Ambulatory Visit (INDEPENDENT_AMBULATORY_CARE_PROVIDER_SITE_OTHER): Payer: BC Managed Care – PPO

## 2023-01-08 DIAGNOSIS — Z0000 Encounter for general adult medical examination without abnormal findings: Secondary | ICD-10-CM | POA: Diagnosis not present

## 2023-01-08 DIAGNOSIS — E890 Postprocedural hypothyroidism: Secondary | ICD-10-CM | POA: Diagnosis not present

## 2023-01-08 LAB — URINALYSIS
Bilirubin Urine: NEGATIVE
Ketones, ur: NEGATIVE
Leukocytes,Ua: NEGATIVE
Nitrite: NEGATIVE
Specific Gravity, Urine: 1.025 (ref 1.000–1.030)
Total Protein, Urine: NEGATIVE
Urine Glucose: NEGATIVE
Urobilinogen, UA: 0.2 (ref 0.0–1.0)
pH: 6.5 (ref 5.0–8.0)

## 2023-01-08 LAB — CBC WITH DIFFERENTIAL/PLATELET
Basophils Absolute: 0 10*3/uL (ref 0.0–0.1)
Basophils Relative: 1 % (ref 0.0–3.0)
Eosinophils Absolute: 0.1 10*3/uL (ref 0.0–0.7)
Eosinophils Relative: 3.7 % (ref 0.0–5.0)
HCT: 37 % (ref 36.0–46.0)
Hemoglobin: 12 g/dL (ref 12.0–15.0)
Lymphocytes Relative: 41.6 % (ref 12.0–46.0)
Lymphs Abs: 1.5 10*3/uL (ref 0.7–4.0)
MCHC: 32.5 g/dL (ref 30.0–36.0)
MCV: 81.1 fl (ref 78.0–100.0)
Monocytes Absolute: 0.3 10*3/uL (ref 0.1–1.0)
Monocytes Relative: 8.8 % (ref 3.0–12.0)
Neutro Abs: 1.6 10*3/uL (ref 1.4–7.7)
Neutrophils Relative %: 44.9 % (ref 43.0–77.0)
Platelets: 266 10*3/uL (ref 150.0–400.0)
RBC: 4.57 Mil/uL (ref 3.87–5.11)
RDW: 14.4 % (ref 11.5–15.5)
WBC: 3.6 10*3/uL — ABNORMAL LOW (ref 4.0–10.5)

## 2023-01-08 LAB — LIPID PANEL
Cholesterol: 211 mg/dL — ABNORMAL HIGH (ref 0–200)
HDL: 63.1 mg/dL (ref >39.00)
LDL Cholesterol: 131 mg/dL — ABNORMAL HIGH (ref 0–99)
NonHDL: 147.43
Total CHOL/HDL Ratio: 3
Triglycerides: 83 mg/dL (ref 0.0–149.0)
VLDL: 16.6 mg/dL (ref 0.0–40.0)

## 2023-01-08 LAB — COMPREHENSIVE METABOLIC PANEL
ALT: 11 U/L (ref 0–35)
AST: 16 U/L (ref 0–37)
Albumin: 4.6 g/dL (ref 3.5–5.2)
Alkaline Phosphatase: 61 U/L (ref 39–117)
BUN: 19 mg/dL (ref 6–23)
CO2: 29 mEq/L (ref 19–32)
Calcium: 9.6 mg/dL (ref 8.4–10.5)
Chloride: 103 mEq/L (ref 96–112)
Creatinine, Ser: 0.79 mg/dL (ref 0.40–1.20)
GFR: 87.23 mL/min (ref >60.00)
Glucose, Bld: 94 mg/dL (ref 70–99)
Potassium: 3.9 mEq/L (ref 3.5–5.1)
Sodium: 139 mEq/L (ref 135–145)
Total Bilirubin: 0.3 mg/dL (ref 0.2–1.2)
Total Protein: 7.9 g/dL (ref 6.0–8.3)

## 2023-01-08 LAB — T4, FREE: Free T4: 1.22 ng/dL (ref 0.60–1.60)

## 2023-01-08 LAB — TSH: TSH: 0.91 u[IU]/mL (ref 0.35–5.50)

## 2023-01-11 ENCOUNTER — Encounter: Payer: Self-pay | Admitting: Internal Medicine

## 2023-01-13 ENCOUNTER — Ambulatory Visit: Payer: BC Managed Care – PPO

## 2023-01-13 DIAGNOSIS — M542 Cervicalgia: Secondary | ICD-10-CM

## 2023-01-13 NOTE — Therapy (Addendum)
PHYSICAL THERAPY UNPLANNED DISCHARGE SUMMARY   Visits from Start of Care: 7  Current functional level related to goals / functional outcomes: Current status unknown   Remaining deficits: Current status unknown   Education / Equipment: Pt has not returned since visit listed below  Patient goals were not assessed. Patient is being discharged due to not returning since the last visit.  (the note below was addended to include the above D/C summary on 04/16/23)  OUTPATIENT PHYSICAL THERAPY TREATMENT NOTE   Patient Name: Linda Stewart MRN: [id number] DOB:[birth year], 52 y.o., female Today's Date: 01/13/2023  PCP: Tresa Garter, MD  REFERRING PROVIDER: Tresa Garter, MD   END OF SESSION:   PT End of Session - 01/13/23 1746     Visit Number 7    Date for PT Re-Evaluation 01/27/23    Authorization Type BC[employer]    PT Start Time 1745    PT Stop Time 1823    PT Time Calculation (min) 38 min    Activity Tolerance Patient tolerated treatment well    Behavior During Therapy [facility] for tasks assessed/performed              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   History of cerebral aneurysm repair dx 10-18-2006 via MRI/MRA  5.52mm x 4.84mm saccular   11-16-2006  stent-assisted coiling LICA intracranial superior hypophyseal region   History of CVA (cerebrovascular accident)    2003   History of ovarian cyst    Pelvic pain in female 12/05/2015   Uterine polyp 12/05/2015   Past Surgical History:  Procedure Laterality Date   HYSTEROSCOPY WITH D & C N/A 02/28/2016   Procedure: DILATATION AND CURETTAGE /DIAGNOSTIC HYSTEROSCOPY;  Surgeon: Sherian Rein, MD;  Location: [facility];  Service: Gynecology;  Laterality: N/A;   LAPAROSCOPIC OVARIAN CYSTECTOMY  2001   LAPAROSCOPY N/A 02/28/2016   Procedure: OPERATIVE  LAPAROSCOPY; LYSIS OF ADHESIONS;  Surgeon: Sherian Rein, MD;  Location: [facility];  Service: Gynecology;  Laterality: N/A;   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  11-16-2006   dr deveshwar   coiling  (general anesthesia)   Patient Active Problem List   Diagnosis Date Noted   Cervical radiculitis 11/16/2022   Cervical pain (neck) 10/28/2022   Abnormal cervical Papanicolaou smear 08/24/2022   Aneurysm 08/24/2022   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   Dysmenorrhea 08/24/2022   Perimenopausal disorder 08/24/2022   Mood disorder 08/24/2022   Vitamin D deficiency 11/12/2020   Vaginitis 11/12/2020   Abnormal uterine bleeding 10/03/2020   Menorrhagia 10/03/2020   Migraine with aura and without status migrainosus, not intractable 04/16/2020   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   Right knee pain 09/18/2019   Abdominal pain 09/06/2019   Weight gain 09/06/2019   Hyperthyroidism 03/25/2017   Left wrist pain 02/26/2017   Cramps, extremity 12/10/2016   Loss of weight 08/17/2016   Endometriosis determined by laparoscopy 02/28/2016   Pelvic pain in female 12/05/2015   Uterine polyp 12/05/2015   Rectal pain 12/06/2013   LLQ abdominal pain 10/19/2013   Graves disease 10/18/2013   Hematuria 10/18/2013   Paresthesia 11/25/2011   B12 deficiency 11/25/2011   Well adult exam 07/17/2011   Neoplasm of uncertain behavior of skin 07/17/2011   PNEUMONIA 07/03/2010   Headache 06/04/2010   TORTICOLLIS,  SPASMODIC 04/02/2010   NECK PAIN, LEFT 04/02/2010   Fatigue 06/03/2009   Low back pain 03/08/2009   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   Brain aneurysm 06/03/2007    REFERRING DIAG: Cervical pain (neck) [M54.2], Cervical radiculitis [M54.12]   THERAPY DIAG:  Cervicalgia  Rationale for Evaluation and Treatment Rehabilitation  PERTINENT HISTORY: Graves disease, migraine, aneurism    PRECAUTIONS: None   SUBJECTIVE:                                                                                                                                                                                      SUBJECTIVE STATEMENT:  Pt reports that she has periods of higher pain with no clear trigger.  She does state that she worked on Saturday which did increase in her pain the next day. She also states that the pain is the worst at night.   PAIN:  Are you having pain? Yes Pain location: R sided neck pain NPRS scale:  2/10 to 10/10 Aggravating factors: stress, rotation of neck Relieving factors: rest, heat Pain description: constant Stage: Chronic Stability: staying the same 24 hour pattern: worse with activity    OBJECTIVE: (objective measures completed at initial evaluation unless otherwise dated)  DIAGNOSTIC FINDINGS:  Degenerative disease with disc height loss at C5-6 and C6-7. Bilateral facet arthropathy at C7-T1. Right uncovertebral degenerative changes C5-6 with foraminal encroachment. Left neural foramina are patent.   IMPRESSION: 1. Cervical spine spondylosis as described above. 2. No acute osseous injury of the cervical spine.   GENERAL OBSERVATION:          Forward head                               SENSATION:          Light touch: Appears intact           PALPATION: TTP proximal LS, and scalenes   Cervical ROM   ROM ROM  12/03/2022  Flexion 60  Extension 40  Right lateral flexion 30  Left lateral flexion 30*  Right rotation 52  Left rotation 55  Flexion rotation (normal is 30 degrees)    Flexion rotation (normal is 30 degrees)      (Blank rows = not tested, N = WNL, * = concordant pain)   UPPER EXTREMITY MMT:   MMT Right 12/03/2022 Left 12/03/2022  Shoulder flexion n n  Shoulder abduction (C5) n n  Shoulder ER n n  Shoulder IR      Middle trapezius      Lower trapezius      Shoulder extension  Grip strength      Shoulder shrug  (C4)      Elbow flexion (C6)      Elbow ext (C7)      Thumb ext (C8)      Finger abd (T1)      Grossly        (Blank rows = not tested, score listed is out of 5 possible points.  N = WNL, D = diminished, C = clear for gross weakness with myotome testing, * = concordant pain with testing)     SPECIAL TESTS:           Spurling's (-)   JOINT MOBILITY TESTING:  Guarded with joint mobility testing    PATIENT SURVEYS:  FOTO 58 -> 66              TODAY'S TREATMENT:  [facility] Adult PT Treatment:                                                DATE: 01/13/23 Therapeutic Exercise: UBE lvl 1.5 x 3'/3' fwd/bwd while taking subjective  Bird dog - 20x w/ chin tuck Cervical gravity resisted ROM - 15x flexion/ext/sidebend over edge of table Seated bilateral ER 3x10 RTB Seated horizontal abd 2x10 RTB Seated diagonals x10 BIL RTB Manual Therapy (performed by certified therapist Alphonzo Severance, DPT): Skilled palpation for TPDN Trigger Point Dry Needling Treatment: Pre-treatment instruction: Patient instructed on dry needling rationale, procedures, and possible side effects including pain during treatment (achy,cramping feeling), bruising, drop of blood, lightheadedness, nausea, sweating. Patient Consent Given: Yes Education handout provided: No Muscles treated: R SCM Needle size and number: .30x66mm x 1 Electrical stimulation performed: No Parameters: N/A Treatment response/outcome: Twitch response elicited and Palpable decrease in muscle tension Post-treatment instructions: Patient instructed to expect possible mild to moderate muscle soreness later today and/or tomorrow. Patient instructed in methods to reduce muscle soreness and to continue prescribed HEP. If patient was dry needled over the lung field, patient was instructed on signs and symptoms of pneumothorax and, however unlikely, to see immediate medical attention should they occur. Patient was also educated on signs and symptoms of  infection and to seek medical attention should they occur. Patient verbalized understanding of these instructions and education.      [facility] Adult PT Treatment:                                                DATE: 01/06/23  Therapeutic Exercise: UBE lvl 1.5 x 3 min while taking subjective  Bird dog - 20x w/ chin tuck Cervical gravity resisted ROM - 15x flexion/ext/sidebend over edge of table SCM stretch - d/c d/t lower Cx pain Manual Therapy: Skilled palpation for TPDN Trigger Point Dry Needling Treatment: Pre-treatment instruction: Patient instructed on dry needling rationale, procedures, and possible side effects including pain during treatment (achy,cramping feeling), bruising, drop of blood, lightheadedness, nausea, sweating. Patient Consent Given: Yes Education handout provided: No Muscles treated: R sub occipitals R SCM Needle size and number: .30x28mm x 2 Electrical stimulation performed: No Parameters: N/A Treatment response/outcome: Twitch response elicited and Palpable decrease in muscle tension Post-treatment instructions: Patient instructed to expect possible mild to moderate muscle soreness later today and/or tomorrow. Patient instructed  in methods to reduce muscle soreness and to continue prescribed HEP. If patient was dry needled over the lung field, patient was instructed on signs and symptoms of pneumothorax and, however unlikely, to see immediate medical attention should they occur. Patient was also educated on signs and symptoms of infection and to seek medical attention should they occur. Patient verbalized understanding of these instructions and education.    PATIENT EDUCATION:  POC, diagnosis, prognosis, HEP, and outcome measures.  Pt educated via explanation, demonstration, and handout (HEP).  Pt confirms understanding verbally.             HOME EXERCISE PROGRAM: Access Code: [email or web address] URL: [email or web address] Date: 12/21/2022 Prepared by: Edwinna Areola  Exercises - Supine Chin Tuck  - 2 x daily - 7 x weekly - 3 sets - 10 reps - Seated Assisted Cervical Rotation with Towel  - 3 x daily - 7 x weekly - 2 sets - 15 reps - Standing Shoulder Row with Anchored Resistance  - 1 x daily - 7 x weekly - 3 sets - 10 reps - green band hold   Treatment priorities     Eval (12/03/2022)              Manual/TDN                                                                              ASSESSMENT:   CLINICAL IMPRESSION: Patient presents to PT with continued reports of Rt sided neck pain. TPDN performed by certified therapist, Alphonzo Severance DPT, with patient reporting therapeutic benefit. Session today continued to focus on DNF and periscapular strengthening. Patient was able to tolerate all prescribed exercises with no adverse effects. Patient continues to benefit from skilled PT services and should be progressed as able to improve functional independence.    OBJECTIVE IMPAIRMENTS: Pain, cervical ROM   ACTIVITY LIMITATIONS: work, driving, reading   PERSONAL FACTORS: See medical history and pertinent history       GOALS:     SHORT TERM GOALS: Target date: 12/31/2022   Tamra will be >75% HEP compliant to improve carryover between sessions and facilitate independent management of condition   Evaluation (12/03/2022): ongoing Goal status: MET     LONG TERM GOALS: Target date: 01/28/2023   Ezrie will improve FOTO score to 66 as a proxy for functional improvement   Evaluation/Baseline (12/03/2022): 58 Goal status: INITIAL     2.  Djenaba will self report >/= 50% decrease in pain from evaluation    Evaluation/Baseline (12/03/2022): 10/10 max pain Goal status: INITIAL     3.  Jazyah will report confidence in self management of condition at time of discharge with advanced HEP   Evaluation/Baseline (12/03/2022): unable to self manage Goal status: INITIAL   4.  Fajr will be able to drive and work, not limited by pain    Evaluation/Baseline (12/03/2022): limited Goal status: INITIAL       PLAN: PT FREQUENCY: 1-2x/week   PT DURATION: 8 weeks (Ending 01/28/2023)   PLANNED INTERVENTIONS: Therapeutic exercises, Aquatic therapy, Therapeutic activity, Neuro Muscular re-education, Gait training, Patient/Family education, Joint mobilization, Dry Needling, Electrical stimulation, Spinal mobilization and/or manipulation, Moist heat, Taping, Vasopneumatic device, Ionotophoresis 4mg /ml  Dexamethasone, and Manual therapy   PLAN FOR NEXT SESSION: TDN, manual, test DNF endurance   Berta Minor, PTA 01/13/2023, 6:23 PM

## 2023-01-20 ENCOUNTER — Ambulatory Visit: Payer: BC Managed Care – PPO | Admitting: Psychology

## 2023-01-26 ENCOUNTER — Ambulatory Visit: Payer: BC Managed Care – PPO | Admitting: Physical Therapy

## 2023-01-29 ENCOUNTER — Ambulatory Visit (INDEPENDENT_AMBULATORY_CARE_PROVIDER_SITE_OTHER): Payer: BC Managed Care – PPO | Admitting: Psychology

## 2023-01-29 DIAGNOSIS — F4323 Adjustment disorder with mixed anxiety and depressed mood: Secondary | ICD-10-CM | POA: Diagnosis not present

## 2023-01-29 NOTE — Progress Notes (Signed)
[facility] Counselor/Therapist Progress Note  Patient ID: Linda Stewart, MRN: [id number],    Date: 01/29/2023  Time Spent: 45 mins  Treatment Type: Individual Therapy  Reported Symptoms: Pt presents for session via WebEx video.  Pt grants consent for session, stating she is in her car with no one else present.  I shared with pt that I am in my office with no one else here either.  Mental Status Exam: Appearance:  Casual     Behavior: Appropriate  Motor: Normal  Speech/Language:  Clear and Coherent  Affect: Appropriate  Mood: normal  Thought process: normal  Thought content:   WNL  Sensory/Perceptual disturbances:   WNL  Orientation: oriented to person, place, and time/date  Attention: Good  Concentration: Good  Memory: WNL  Fund of knowledge:  Good  Insight:   Good  Judgment:  Good  Impulse Control: Good   Risk Assessment: Danger to Self:  No Self-injurious Behavior: No Danger to Others: No Duty to Warn:no Physical Aggression / Violence:No  Access to Firearms a concern: No  Gang Involvement:No   Subjective: Pt shares that she "has been pretty good since our last session.  I am still having pretty back neck pain; the PT did not help so much so I am having an MRI on 5/1.  I have had to get pain medication from my doctor.  I just don't know what caused it.  I am excited to have the MRI so we can find out what is going on."  Pt shares that, instead of her neck pain, everything else is going well.  She says that her sons and her husband are doing well; her youngest son is still struggling in school but she is hopeful that he will finish the school year (10th grade) well.  Pt wants to talk about her family wedding she is going to this next weekend (her step-sister's wedding).  She felt rejected and left out by the bride's mother who recently passed away.  The bride was always nice to her so that is why she is going to the wedding.  Pt still has some anxious  feelings about going to the wedding since her husband is not going to go with her.  Talked with pt about ways to seek out people that she knows who may be going to the wedding so she can spend time with them at the event.  Pt shares that she did meet with her PCP and he increased her dose of Lexapro to 10 mg.  Pt shares that her cat and dog are doing well and she enjoys spending time with them.  Pt shares that work is going well.  She feels she is doing a good job with her emotions at work; she shares that she believes everyone is looking forward to being finished with the school year.  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  Pt shares that her youngest son will be working this summer for the first time; he will be working at [employer].  Her oldest son will also be working but that is not new for him.  Her niece is coming to visit again from [place] this summer as well; she is 51 yo and they enjoy visiting with each other.  Encouraged pt to continue with her self care  activities and we will meet in 3 wks for a follow up session.  Interventions: Cognitive Behavioral Therapy  Diagnosis:Adjustment disorder with mixed anxiety and depressed mood  Plan: Treatment Plan Strengths/Abilities:  Intelligent, Intuitive, Willing to participate in therapy Treatment Preferences:  Outpatient Individual Therapy Statement of Needs:  Patient is to use CBT, mindfulness and coping skills to help manage and/or decrease symptoms associated with their diagnosis. Symptoms:  Depressed/Irritable mood, worry, social withdrawal Problems Addressed:  Depressive thoughts, Sadness, Sleep issues, etc. Long Term Goals:  Pt to reduce overall level, frequency, and intensity of the feelings of depression/anxiety as evidenced by decreased irritability, negative self talk, and helpless feelings from 6  to 7 days/week to 0 to 1 days/week, per client report, for at least 3 consecutive months.  Progress: 20% Short Term Goals:  Pt to verbally express understanding of the relationship between feelings of depression/anxiety and their impact on thinking patterns and behaviors.  Pt to verbalize an understanding of the role that distorted thinking plays in creating fears, excessive worry, and ruminations.  Progress: 20% Target Date:  10/10/2023 Frequency:  Bi-weekly Modality:  Cognitive Behavioral Therapy Interventions by Therapist:  Therapist will use CBT, Mindfulness exercises, Coping skills and Referrals, as needed by client. Client has verbally approved this treatment plan.  Karie Kirks, [facility]

## 2023-02-03 ENCOUNTER — Ambulatory Visit
Admission: RE | Admit: 2023-02-03 | Discharge: 2023-02-03 | Disposition: A | Discharge status: Home / Self Care | Payer: BC Managed Care – PPO | Source: Ambulatory Visit | Attending: Internal Medicine | Admitting: Internal Medicine

## 2023-02-03 DIAGNOSIS — E559 Vitamin D deficiency, unspecified: Secondary | ICD-10-CM

## 2023-02-03 DIAGNOSIS — M542 Cervicalgia: Secondary | ICD-10-CM

## 2023-02-17 ENCOUNTER — Ambulatory Visit: Payer: BC Managed Care – PPO | Admitting: Psychology

## 2023-02-17 DIAGNOSIS — F4323 Adjustment disorder with mixed anxiety and depressed mood: Secondary | ICD-10-CM

## 2023-02-17 NOTE — Progress Notes (Signed)
[facility] Counselor/Therapist Progress Note  Patient ID: Linda Stewart, MRN: [id number],    Date: 02/17/2023  Time Spent: 45 mins  Treatment Type: Individual Therapy  Reported Symptoms: Pt presents for session via Caregility video.  Pt grants consent for session, stating she is in her car with no one else present.  I shared with pt that I am in my office with no one else here either.  Mental Status Exam: Appearance:  Casual     Behavior: Appropriate  Motor: Normal  Speech/Language:  Clear and Coherent  Affect: Appropriate  Mood: normal  Thought process: normal  Thought content:   WNL  Sensory/Perceptual disturbances:   WNL  Orientation: oriented to person, place, and time/date  Attention: Good  Concentration: Good  Memory: WNL  Fund of knowledge:  Good  Insight:   Good  Judgment:  Good  Impulse Control: Good   Risk Assessment: Danger to Self:  No Self-injurious Behavior: No Danger to Others: No Duty to Warn:no Physical Aggression / Violence:No  Access to Firearms a concern: No  Gang Involvement:No   Subjective: Pt shares that she "has been good since our last session.  I have been able to stay calm and I am glad about that.  Our last school day is 6/14.  I did go to my step-sister's wedding that I told you about and I think it went OK.  I was glad that I was invited and that I had a good time."  Pt shares she is glad she went and that she had a nice time.  She was sad a couple of times when she was not invited to the rehearsal dinner and to the bride's home the morning after the wedding but she kept her hurt feelings to herself.  It turns out that the bride came to pick her up from the hotel on Sunday and took pt to her home to visit with her friends before taking pt to the airport for her flight in the afternoon.  Her husband and oldest son just got back home from a week in [place] and they had a great time.  Pt had an MRI on her neck but it did not show  anything that was wrong.  She is planning to go to the chiropractor next to see if they can help her.  Pt shares that her youngest son is excited about the end of school; she is hopeful his grades will be good at the end of the quarter.  Pt feels like she is doing well with the Lexapro 10 mg daily.  Both of her sons will be working at the [employer] in [place] for the summer.  Pt shares that work is going well.  Her niece is coming to visit again from [place] this summer as well; she is 51 yo and they enjoy visiting with each other.  Encouraged pt to continue with her self care activities and we will meet in 2 wks for a follow up session.  Interventions: Cognitive Behavioral Therapy  Diagnosis:Adjustment disorder with mixed anxiety and depressed mood  Plan: Treatment Plan Strengths/Abilities:  Intelligent, Intuitive, Willing to participate in therapy Treatment Preferences:  Outpatient Individual Therapy Statement of Needs:  Patient is to use CBT, mindfulness and coping skills to help manage and/or decrease symptoms associated with their diagnosis. Symptoms:  Depressed/Irritable mood, worry, social withdrawal Problems Addressed:  Depressive thoughts, Sadness, Sleep issues, etc. Long Term Goals:  Pt to reduce overall level, frequency, and intensity of  the feelings of depression/anxiety as evidenced by decreased irritability, negative self talk, and helpless feelings from 6 to 7 days/week to 0 to 1 days/week, per client report, for at least 3 consecutive months.  Progress: 20% Short Term Goals:  Pt to verbally express understanding of the relationship between feelings of depression/anxiety and their impact on thinking patterns and behaviors.  Pt to verbalize an understanding of the role that distorted thinking plays in creating fears, excessive worry, and ruminations.  Progress: 20% Target Date:  10/10/2023 Frequency:  Bi-weekly Modality:  Cognitive Behavioral Therapy Interventions by Therapist:  Therapist  will use CBT, Mindfulness exercises, Coping skills and Referrals, as needed by client. Client has verbally approved this treatment plan.  Karie Kirks, [facility]

## 2023-03-05 ENCOUNTER — Ambulatory Visit: Payer: BC Managed Care – PPO | Admitting: Psychology

## 2023-03-05 DIAGNOSIS — F4323 Adjustment disorder with mixed anxiety and depressed mood: Secondary | ICD-10-CM

## 2023-03-05 NOTE — Progress Notes (Signed)
[facility] Counselor/Therapist Progress Note  Patient ID: Linda Stewart, MRN: [id number],    Date: 03/05/2023  Time Spent: 30 mins  Treatment Type: Individual Therapy  Reported Symptoms: Pt presents for session via Caregility video.  Pt grants consent for session, stating she is in her car with no one else present.  I shared with pt that I am in my office with no one else here either.  Mental Status Exam: Appearance:  Casual     Behavior: Appropriate  Motor: Normal  Speech/Language:  Clear and Coherent  Affect: Appropriate  Mood: normal  Thought process: normal  Thought content:   WNL  Sensory/Perceptual disturbances:   WNL  Orientation: oriented to person, place, and time/date  Attention: Good  Concentration: Good  Memory: WNL  Fund of knowledge:  Good  Insight:   Good  Judgment:  Good  Impulse Control: Good   Risk Assessment: Danger to Self:  No Self-injurious Behavior: No Danger to Others: No Duty to Warn:no Physical Aggression / Violence:No  Access to Firearms a concern: No  Gang Involvement:No   Subjective: Pt shares that she "has been good since our last session.  I have been really happy with how things are going."  Pt shares she is doing well at work; "regular bumps but I am managing those well and I feel good about it."  Pt shares that things at home are good; her husband and her boys are fine; the boys are finished with school and her oldest son is already working his summer job.  He was late for work one day this week and she was calm in talking with him about getting up early enough and to remember to take everything he needs for work with him.  Pt shares that her youngest son finished school yesterday and he will be starting his summer job soon too.  Her husband is working in [place] now and he likes that better.  Pt shares that they have been working in their yard, she has been spending more time with her husband since he is home now, she has  also been spending time with friends, she also enjoys cooking for the family, and she has been walking more recently, etc.  She is not having as much neck pain as before; she still has some discomfort but it is manageable.  Pt feels like she is doing well with the Lexapro 10 mg daily.  Her niece is coming to visit again from [place] on June 23 and she is looking forward to her visit; her niece will be here for 3 wks.  Encouraged pt to continue with her self care activities and pt will call the office to schedule a follow up session.  Interventions: Cognitive Behavioral Therapy  Diagnosis:Adjustment disorder with mixed anxiety and depressed mood  Plan: Treatment Plan Strengths/Abilities:  Intelligent, Intuitive, Willing to participate in therapy Treatment Preferences:  Outpatient Individual Therapy Statement of Needs:  Patient is to use CBT, mindfulness and coping skills to help manage and/or decrease symptoms associated with their diagnosis. Symptoms:  Depressed/Irritable mood, worry, social withdrawal Problems Addressed:  Depressive thoughts, Sadness, Sleep issues, etc. Long Term Goals:  Pt to reduce overall level, frequency, and intensity of the feelings of depression/anxiety as evidenced by decreased irritability, negative self talk, and helpless feelings from 6 to 7 days/week to 0 to 1 days/week, per client report, for at least 3 consecutive months.  Progress: 20% Short Term Goals:  Pt to verbally express understanding of  the relationship between feelings of depression/anxiety and their impact on thinking patterns and behaviors.  Pt to verbalize an understanding of the role that distorted thinking plays in creating fears, excessive worry, and ruminations.  Progress: 20% Target Date:  10/10/2023 Frequency:  Bi-weekly Modality:  Cognitive Behavioral Therapy Interventions by Therapist:  Therapist will use CBT, Mindfulness exercises, Coping skills and Referrals, as needed by client. Client has  verbally approved this treatment plan.  Karie Kirks, [facility]

## 2023-03-15 ENCOUNTER — Encounter: Payer: Self-pay | Admitting: Family Medicine

## 2023-03-15 ENCOUNTER — Ambulatory Visit: Payer: BC Managed Care – PPO | Admitting: Family Medicine

## 2023-03-15 VITALS — BP 100/60 | HR 70 | Temp 98.2°F | Resp 20 | Ht 63.0 in | Wt 158.0 lb

## 2023-03-15 DIAGNOSIS — L239 Allergic contact dermatitis, unspecified cause: Secondary | ICD-10-CM | POA: Diagnosis not present

## 2023-03-15 MED ORDER — PREDNISONE 20 MG PO TABS: 40.0000 mg | ORAL_TABLET | Freq: Every day | ORAL | 0 refills | Status: AC

## 2023-03-15 NOTE — Progress Notes (Signed)
Assessment & Plan:  1. Allergic dermatitis May continue allergy pill and Benadryl cream with prednisone.  Encouraged ice if needed.  Discussed heat will make her itch more. - predniSONE (DELTASONE) 20 MG tablet; Take 2 tablets (40 mg total) by mouth daily for 5 days.  Dispense: 10 tablet; Refill: 0   Follow up plan: Return if symptoms worsen or fail to improve.  Linda Boston, MSN, APRN, FNP-C  Subjective:  HPI: Linda Stewart is a 52 y.o. female presenting on 03/15/2023 for Allergic Reaction (Rash - starting drinking New "mushroom coffee" called Ryze. )  Patient reports a rash that started 9 days ago.  She questions if she is having an allergic reaction.  The rash is spreading.  She has been applying Benadryl cream and taking an allergy medication, which helps a lot.  She has been drinking a new mushroom coffee every morning that she started at the end of May.    ROS: Negative unless specifically indicated above in HPI.   Relevant past medical history reviewed and updated as indicated.   Allergies and medications reviewed and updated.   Current Outpatient Medications:    aspirin 325 MG EC tablet, Take 325 mg by mouth daily., Disp: , Rfl:    Cholecalciferol (VITAMIN D3) 2000 units TABS, Take 2,000 Units by mouth daily. , Disp: , Rfl:    Cyanocobalamin (VITAMIN B-12) 500 MCG SUBL, Place 1 tablet (500 mcg total) under the tongue 1 day or 1 dose., Disp: 100 tablet, Rfl: 3   escitalopram (LEXAPRO) 10 MG tablet, Take 1 tablet (10 mg total) by mouth daily., Disp: 90 tablet, Rfl: 1   levothyroxine (SYNTHROID) 125 MCG tablet, Take 1 tablet (125 mcg total) by mouth daily., Disp: 90 tablet, Rfl: 3   triamcinolone cream (KENALOG) 0.5 %, Apply 1 Application topically 3 (three) times daily as needed., Disp: 30 g, Rfl: 1   ALPRAZolam (XANAX) 0.25 MG tablet, Take 1-2 tablets (0.25-0.5 mg total) by mouth 2 (two) times daily as needed for anxiety. (Patient not taking: Reported on 03/15/2023), Disp:  60 tablet, Rfl: 1   cyclobenzaprine (FLEXERIL) 5 MG tablet, Take 1 tablet (5 mg total) by mouth 3 (three) times daily as needed for muscle spasms. (Patient not taking: Reported on 03/15/2023), Disp: 30 tablet, Rfl: 1   fluticasone (FLONASE) 50 MCG/ACT nasal spray, Place 2 sprays into both nostrils daily. (Patient not taking: Reported on 03/15/2023), Disp: 16 g, Rfl: 0   traMADol (ULTRAM) 50 MG tablet, Take 1 tablet (50 mg total) by mouth every 6 (six) hours as needed. (Patient not taking: Reported on 03/15/2023), Disp: 20 tablet, Rfl: 1   Ubrogepant (UBRELVY) 100 MG TABS, Take 1 tablet by mouth daily as needed. (Patient not taking: Reported on 03/15/2023), Disp: 16 tablet, Rfl: 2  No Known Allergies  Objective:   BP 100/60   Pulse 70   Temp 98.2 F (36.8 C)   Resp 20   Ht 5\' 3"  (1.6 m)   Wt 158 lb (71.7 kg)   LMP 02/12/2023 (Approximate)   BMI 27.99 kg/m    Physical Exam Vitals reviewed.  Constitutional:      General: She is not in acute distress.    Appearance: Normal appearance. She is not ill-appearing, toxic-appearing or diaphoretic.  HENT:     Head: Normocephalic and atraumatic.  Eyes:     General: No scleral icterus.       Right eye: No discharge.        Left eye:  No discharge.     Conjunctiva/sclera: Conjunctivae normal.  Cardiovascular:     Rate and Rhythm: Normal rate.  Pulmonary:     Effort: Pulmonary effort is normal. No respiratory distress.  Musculoskeletal:        General: Normal range of motion.     Cervical back: Normal range of motion.  Skin:    General: Skin is warm and dry.     Capillary Refill: Capillary refill takes less than 2 seconds.     Findings: Rash (BLE & BUE (lower much worse than upper)) present. Rash is vesicular.  Neurological:     General: No focal deficit present.     Mental Status: She is alert and oriented to person, place, and time. Mental status is at baseline.  Psychiatric:        Mood and Affect: Mood normal.        Behavior:  Behavior normal.        Thought Content: Thought content normal.        Judgment: Judgment normal.

## 2023-03-23 ENCOUNTER — Encounter: Payer: Self-pay | Admitting: Emergency Medicine

## 2023-03-23 ENCOUNTER — Ambulatory Visit: Payer: BC Managed Care – PPO | Admitting: Emergency Medicine

## 2023-03-23 VITALS — BP 102/76 | HR 70 | Temp 97.6°F | Ht 63.0 in | Wt 155.4 lb

## 2023-03-23 DIAGNOSIS — L239 Allergic contact dermatitis, unspecified cause: Secondary | ICD-10-CM | POA: Diagnosis not present

## 2023-03-23 DIAGNOSIS — L237 Allergic contact dermatitis due to plants, except food: Secondary | ICD-10-CM

## 2023-03-23 MED ORDER — METHYLPREDNISOLONE 4 MG PO TBPK: ORAL_TABLET | ORAL | 1 refills | Status: DC

## 2023-03-23 MED ORDER — METHYLPREDNISOLONE ACETATE 80 MG/ML IJ SUSP
80.0000 mg | Freq: Once | INTRAMUSCULAR | Status: AC
  Administered 2023-03-23: 80 mg via INTRAMUSCULAR

## 2023-03-23 NOTE — Patient Instructions (Signed)
Contact Dermatitis Dermatitis is when your skin becomes red, sore, and swollen.  Contact dermatitis happens when your body reacts to something that touches the skin. There are 2 types: Irritant contact dermatitis. This is when something bothers your skin, like soap. Allergic contact dermatitis. This is when your skin touches something you are allergic to, like poison ivy. What are the causes? Irritant contact dermatitis may be caused by: Makeup. Soaps. Detergents. Bleaches. Acids. Metals, like nickel. Allergic contact dermatitis may be caused by: Plants. Chemicals. Jewelry. Latex. Medicines. Preservatives. These are things added to products to help them last longer. There may be some in your clothes. What increases the risk? Having a job where you have to be near things that bother your skin. Having asthma or eczema. What are the signs or symptoms?  Dry or flaky skin. Redness. Cracks. Itching. Moderate symptoms of this condition include: Pain or a burning feeling. Blisters. Blood or clear fluid coming from cracks in your skin. Swelling. This may be on your eyelids, mouth, or genitals. How is this treated? Your doctor will find out what is making your skin react. Then, you can protect your skin. You may need to use: Steroid creams, ointments, or medicines. Antibiotics or other ointments, if you have a skin infection. Lotion or medicines to help with itching. A bandage. Follow these instructions at home: Skin care Put moisturizer on your skin when it needs it. Put cool, wet cloths on your skin (cool compresses). Put a baking soda paste on your skin. Stir water into baking soda until it looks like a paste. Do not scratch your skin. Try not to have things rub up against your skin. Avoid tight clothing. Avoid using soaps, perfumes, and dyes. Check your skin every day for signs of infection. Check for: More redness, swelling, or pain. More fluid or blood. Warmth. Pus or  a bad smell. Medicines Take or apply over-the-counter and prescription medicines only as told by your doctor. If you were prescribed antibiotics, take or apply them as told by your doctor. Do not stop using them even if you start to feel better. Bathing Take a bath with: Epsom salts. Baking soda. Colloidal oatmeal. Bathe less often. Bathe in warm water. Try not to use hot water. Bandage care If you were given a bandage, change it as told by your doctor. Wash your hands with soap and water for at least 20 seconds before and after you change your bandage. If you cannot use soap and water, use hand sanitizer. General instructions Avoid the things that caused your reaction. If you don't know what caused it, keep a journal. Write down: What you eat. What skin products you use. What you drink. What you wear. Contact a doctor if: You do not get better with treatment. You get worse. You have signs of infection. You have a fever. You have new symptoms. Your bone or joint near the area hurts after the skin has healed. Get help right away if: You see red streaks coming from the area. The area turns darker. You have trouble breathing. This information is not intended to replace advice given to you by your health care provider. Make sure you discuss any questions you have with your health care provider. Document Revised: 03/27/2022 Document Reviewed: 03/27/2022 Elsevier Patient Education  2024 Elsevier Inc.  

## 2023-03-23 NOTE — Progress Notes (Signed)
Linda Stewart 52 y.o.   Chief Complaint  Patient presents with   Allergic Reaction    Mushroom coffee allergic reaction. Patient states that she was seen last Monday and the Prednisone didn't help. States that she was very itchy , rash all over    HISTORY OF PRESENT ILLNESS: Acute problem visit today.  Patient of Dr. Sonda Primes This is a 52 y.o. female complaining of persistent allergic reaction mostly to lower extremities Unknown trigger. Seen in this office recently.  Was treated with 5 days of fixed dose corticosteroid 40 mg daily   Allergic Reaction Associated symptoms include itching and a rash. Pertinent negatives include no abdominal pain, chest pain, coughing or vomiting.     Prior to Admission medications   Medication Sig Start Date End Date Taking? Authorizing Provider  aspirin 325 MG EC tablet Take 325 mg by mouth daily.   Yes [provider]  Cholecalciferol (VITAMIN D3) 2000 units TABS Take 2,000 Units by mouth daily.    Yes [provider]  Cyanocobalamin (VITAMIN B-12) 500 MCG SUBL Place 1 tablet (500 mcg total) under the tongue 1 day or 1 dose. 11/25/11  Yes Plotnikov, Georgina Quint, MD  cyclobenzaprine (FLEXERIL) 5 MG tablet Take 1 tablet (5 mg total) by mouth 3 (three) times daily as needed for muscle spasms. 10/28/22  Yes Plotnikov, Georgina Quint, MD  escitalopram (LEXAPRO) 10 MG tablet Take 1 tablet (10 mg total) by mouth daily. 01/05/23  Yes Plotnikov, Georgina Quint, MD  levothyroxine (SYNTHROID) 125 MCG tablet Take 1 tablet (125 mcg total) by mouth daily. 06/29/22  Yes Carlus Pavlov, MD  traMADol (ULTRAM) 50 MG tablet Take 1 tablet (50 mg total) by mouth every 6 (six) hours as needed. 10/28/22  Yes Plotnikov, Georgina Quint, MD  triamcinolone cream (KENALOG) 0.5 % Apply 1 Application topically 3 (three) times daily as needed. 01/05/23 01/05/24 Yes Plotnikov, Georgina Quint, MD  Ubrogepant (UBRELVY) 100 MG TABS Take 1 tablet by mouth daily as needed. 05/30/21  Yes  Plotnikov, Georgina Quint, MD  ALPRAZolam (XANAX) 0.25 MG tablet Take 1-2 tablets (0.25-0.5 mg total) by mouth 2 (two) times daily as needed for anxiety. 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   Dysmenorrhea 08/24/2022   Perimenopausal disorder 08/24/2022   Mood disorder (HCC) 08/24/2022   Vitamin D deficiency 11/12/2020   Vaginitis 11/12/2020   Abnormal uterine bleeding 10/03/2020   Menorrhagia 10/03/2020   Migraine with aura and without status migrainosus, not intractable 04/16/2020   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   Right knee pain 09/18/2019   Abdominal pain 09/06/2019   Weight gain 09/06/2019   Hyperthyroidism 03/25/2017   Left wrist pain 02/26/2017   Cramps, extremity 12/10/2016   Loss of weight 08/17/2016   Endometriosis determined by laparoscopy 02/28/2016   Pelvic pain in female 12/05/2015   Uterine polyp 12/05/2015   Rectal pain 12/06/2013   LLQ abdominal pain 10/19/2013   Graves disease 10/18/2013   Hematuria 10/18/2013   Paresthesia 11/25/2011   B12 deficiency 11/25/2011   Well adult exam 07/17/2011   Neoplasm of uncertain behavior of skin 07/17/2011   PNEUMONIA 07/03/2010   Headache 06/04/2010   TORTICOLLIS, SPASMODIC 04/02/2010   NECK PAIN,  LEFT 04/02/2010   Fatigue 06/03/2009   Low back pain 03/08/2009   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   Brain aneurysm 06/03/2007    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HCC)    Endometriosis determined by laparoscopy 02/28/2016   Headache(784.0)    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   History of cerebral aneurysm repair dx 10-18-2006 via MRI/MRA  5.30mm x 4.72mm saccular    11-16-2006  stent-assisted coiling LICA intracranial superior hypophyseal region   History of CVA (cerebrovascular accident)    2003   History of ovarian cyst    Pelvic pain in female 12/05/2015   Uterine polyp 12/05/2015    Past Surgical History:  Procedure Laterality Date   HYSTEROSCOPY WITH D & C N/A 02/28/2016   Procedure: DILATATION AND CURETTAGE /DIAGNOSTIC HYSTEROSCOPY;  Surgeon: Sherian Rein, MD;  Location: [facility];  Service: Gynecology;  Laterality: N/A;   LAPAROSCOPIC OVARIAN CYSTECTOMY  2001   LAPAROSCOPY N/A 02/28/2016   Procedure: OPERATIVE LAPAROSCOPY; LYSIS OF ADHESIONS;  Surgeon: Sherian Rein, MD;  Location: [facility];  Service: Gynecology;  Laterality: N/A;   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  11-16-2006   dr deveshwar   coiling  (general anesthesia)    Social History   Socioeconomic History   Marital status: Married    Spouse name: Not on file   Number of children: 2   Years of education: Not on file   Highest education level: Not on file  Occupational History   Occupation: Producer, television/film/video: [employer]  Tobacco Use   Smoking status: Never   Smokeless tobacco: Never  Substance and Sexual Activity   Alcohol use: No   Drug use: No   Sexual activity: Yes  Other Topics Concern   Not on file  Social History Narrative   Immigrated to [place] 1999 from [place]         Social Determinants of Health   Financial Resource Strain: Not on file  Food Insecurity: Not on file  Transportation Needs: Not on file  Physical Activity: Not on file  Stress: Not on file  Social Connections: Not on file  Intimate Partner Violence: Not on file    Family History  Problem Relation Age of Onset   Leukemia Mother    Cancer Mother 71       leukemia   Lung cancer Father        SMOKER   Cancer Father 52       lung ca   Heart  disease Other    Stroke Other    Alcohol abuse Other    Breast cancer Other      Review of Systems  Constitutional: Negative.  Negative for chills and fever.  HENT:  Negative for congestion and sore throat.   Respiratory: Negative.  Negative for cough and shortness of breath.   Cardiovascular: Negative.  Negative for chest pain and palpitations.  Gastrointestinal:  Negative for abdominal pain, nausea and vomiting.  Genitourinary: Negative.   Skin:  Positive for itching and rash.  Neurological:  Negative for dizziness and headaches.  All other systems reviewed and are negative.   Vitals:   03/23/23 1504  BP: 102/76  Pulse: 70  Temp: 97.6 F (36.4 C)  SpO2: 97%    Physical Exam Vitals reviewed.  Constitutional:      Appearance: Normal appearance.  HENT:  Head: Normocephalic.     Mouth/Throat:     Mouth: Mucous membranes are moist.     Pharynx: Oropharynx is clear.  Eyes:     Extraocular Movements: Extraocular movements intact.     Pupils: Pupils are equal, round, and reactive to light.  Cardiovascular:     Rate and Rhythm: Normal rate and regular rhythm.     Pulses: Normal pulses.     Heart sounds: Normal heart sounds.  Pulmonary:     Effort: Pulmonary effort is normal.     Breath sounds: Normal breath sounds.  Musculoskeletal:     Cervical back: No tenderness.  Lymphadenopathy:     Cervical: No cervical adenopathy.  Skin:    General: Skin is warm and dry.     Findings: Rash present.     Comments: Poison ivy like rash to lower extremities  Neurological:     Mental Status: She is alert and oriented to person, place, and time.  Psychiatric:        Mood and Affect: Mood normal.        Behavior: Behavior normal.      ASSESSMENT & PLAN: A total of 33 minutes was spent with the patient and counseling/coordination of care regarding preparing for this visit, review of most recent office visit notes, review of past medical history, review of all medications,  diagnosis of allergic dermatitis and treatment, need for corticosteroids, prognosis, documentation and need for follow-up  Problem List Items Addressed This Visit       Musculoskeletal and Integument   Allergic dermatitis - Primary    Persistent allergic rash.  Suspicious for poison ivy. Clinically stable.  No signs of anaphylaxis. Recommend to restart corticosteroid treatment Depo-Medrol 80 mg IM injection in the office followed by Medrol Dosepak for the next 7 days ED precautions given Advised to contact the office if no better or worse during the next several days Also recommend to take daily Zyrtec 10 mg for 7 days May continue using Benadryl cream      Relevant Medications   methylPREDNISolone (MEDROL DOSEPAK) 4 MG TBPK tablet   Poison ivy dermatitis   Patient Instructions  Contact Dermatitis Dermatitis is when your skin becomes red, sore, and swollen.  Contact dermatitis happens when your body reacts to something that touches the skin. There are 2 types: Irritant contact dermatitis. This is when something bothers your skin, like soap. Allergic contact dermatitis. This is when your skin touches something you are allergic to, like poison ivy. What are the causes? Irritant contact dermatitis may be caused by: Makeup. Soaps. Detergents. Bleaches. Acids. Metals, like nickel. Allergic contact dermatitis may be caused by: Plants. Chemicals. Jewelry. Latex. Medicines. Preservatives. These are things added to products to help them last longer. There may be some in your clothes. What increases the risk? Having a job where you have to be near things that bother your skin. Having asthma or eczema. What are the signs or symptoms?  Dry or flaky skin. Redness. Cracks. Itching. Moderate symptoms of this condition include: Pain or a burning feeling. Blisters. Blood or clear fluid coming from cracks in your skin. Swelling. This may be on your eyelids, mouth, or  genitals. How is this treated? Your doctor will find out what is making your skin react. Then, you can protect your skin. You may need to use: Steroid creams, ointments, or medicines. Antibiotics or other ointments, if you have a skin infection. Lotion or medicines to help with itching. A bandage. Follow  these instructions at home: Skin care Put moisturizer on your skin when it needs it. Put cool, wet cloths on your skin (cool compresses). Put a baking soda paste on your skin. Stir water into baking soda until it looks like a paste. Do not scratch your skin. Try not to have things rub up against your skin. Avoid tight clothing. Avoid using soaps, perfumes, and dyes. Check your skin every day for signs of infection. Check for: More redness, swelling, or pain. More fluid or blood. Warmth. Pus or a bad smell. Medicines Take or apply over-the-counter and prescription medicines only as told by your doctor. If you were prescribed antibiotics, take or apply them as told by your doctor. Do not stop using them even if you start to feel better. Bathing Take a bath with: Epsom salts. Baking soda. Colloidal oatmeal. Bathe less often. Bathe in warm water. Try not to use hot water. Bandage care If you were given a bandage, change it as told by your doctor. Wash your hands with soap and water for at least 20 seconds before and after you change your bandage. If you cannot use soap and water, use hand sanitizer. General instructions Avoid the things that caused your reaction. If you don't know what caused it, keep a journal. Write down: What you eat. What skin products you use. What you drink. What you wear. Contact a doctor if: You do not get better with treatment. You get worse. You have signs of infection. You have a fever. You have new symptoms. Your bone or joint near the area hurts after the skin has healed. Get help right away if: You see red streaks coming from the area. The  area turns darker. You have trouble breathing. This information is not intended to replace advice given to you by your health care provider. Make sure you discuss any questions you have with your health care provider. Document Revised: 03/27/2022 Document Reviewed: 03/27/2022 Elsevier Patient Education  2024 Elsevier Inc.    Edwina Barth, MD [facility]

## 2023-03-23 NOTE — Assessment & Plan Note (Signed)
Persistent allergic rash.  Suspicious for poison ivy. Clinically stable.  No signs of anaphylaxis. Recommend to restart corticosteroid treatment Depo-Medrol 80 mg IM injection in the office followed by Medrol Dosepak for the next 7 days ED precautions given Advised to contact the office if no better or worse during the next several days Also recommend to take daily Zyrtec 10 mg for 7 days May continue using Benadryl cream

## 2023-05-17 ENCOUNTER — Encounter: Payer: Self-pay | Admitting: Internal Medicine

## 2023-05-21 ENCOUNTER — Other Ambulatory Visit: Payer: Self-pay | Admitting: Internal Medicine

## 2023-05-21 DIAGNOSIS — Z1211 Encounter for screening for malignant neoplasm of colon: Secondary | ICD-10-CM

## 2023-07-02 ENCOUNTER — Ambulatory Visit: Payer: BC Managed Care – PPO | Admitting: Internal Medicine

## 2023-07-09 ENCOUNTER — Other Ambulatory Visit: Payer: Self-pay | Admitting: Internal Medicine

## 2023-07-09 DIAGNOSIS — Z1211 Encounter for screening for malignant neoplasm of colon: Secondary | ICD-10-CM

## 2023-07-09 DIAGNOSIS — Z1212 Encounter for screening for malignant neoplasm of rectum: Secondary | ICD-10-CM

## 2023-07-26 ENCOUNTER — Other Ambulatory Visit: Payer: Self-pay | Admitting: Internal Medicine

## 2023-07-27 ENCOUNTER — Ambulatory Visit: Payer: BC Managed Care – PPO | Admitting: Internal Medicine

## 2023-07-27 ENCOUNTER — Encounter: Payer: Self-pay | Admitting: Internal Medicine

## 2023-07-27 VITALS — BP 120/62 | HR 69 | Ht 63.0 in | Wt 156.8 lb

## 2023-07-27 DIAGNOSIS — E890 Postprocedural hypothyroidism: Secondary | ICD-10-CM | POA: Diagnosis not present

## 2023-07-27 NOTE — Progress Notes (Unsigned)
Patient ID: Linda Stewart, female   DOB: 01-17-1971, 52 y.o.   MRN: [id number]   HPI  Linda Stewart is a 52 y.o.-year-old femalemale, returning for f/u for history of Graves ds, now with post ablative hypothyroidism. Last visit 1 year ago.  Interim history: She has no symptoms at today's visit - her hot flushes are almost entirely resolved. At last visit she had upper back and neck pain.  She works on Production designer, theatre/television/film. She had PT >> did not help much. However, she feels much better now, with only minimal pain.  Reviewed history: I saw the pt first in 2015, when she presented with thyrotoxicosis, but this was improving and I advised her to return for labs in 2 months, but did not intervene at that time. Pt. was lost for f/u.   She then presented to PCP in 2017 >> still thyrotoxic >> advised to return to see me.  She was started on Propranolol 10 mg 3x a day by PCP.  We were able to decrease this gradually and then stop  In 05/2018, we increased her methimazole from 5 mg twice a day to 10 mg twice a day.  Subsequent labs were improved.    In 09/2018, I suggested RAI treatment after she returned after long absence with very abnormal TFTs.  Thyroid uptake and scan (10/14/2018): Was consistent with Graves' disease: Elevated 4 hour (54.1%) and 24 hour (66.6) RAI uptake consistent with hyperthyroidism.  Normal thyroid scan   RAI treatment (11/04/2018). She felt well after the treatment, without symptoms other than mild fatigue.  She developed post ablative hypothyroidism and was started on levothyroxine in 12/2018.  We continued to increase the dose afterwards, last dose change was 04/2019.  Pt is on levothyroxine 125 mcg daily, taken: - in am - fasting - ~1.5h from b'fast - no Ca, MVI, PPIs - stopped Fe at night  - not on Biotin - On B12 and D vitamins.  Reviewed patient's TFTs: Lab Results  Component Value Date   TSH 0.91 01/08/2023   TSH 1.760 06/26/2022   TSH 3.85 12/10/2021   TSH  0.85 06/25/2021   TSH 0.75 11/18/2020   TSH 1.08 04/16/2020   TSH 2.35 09/07/2019   TSH 2.38 07/06/2019   TSH 8.43 (H) 04/20/2019   TSH 25.18 (H) 03/03/2019   FREET4 1.22 01/08/2023   FREET4 1.60 06/26/2022   FREET4 0.99 06/25/2021   FREET4 1.12 09/07/2019   FREET4 1.19 07/06/2019   FREET4 0.80 04/20/2019   FREET4 0.82 03/03/2019   FREET4 0.38 (L) 12/26/2018   FREET4 1.42 09/16/2018   FREET4 1.40 08/29/2018    Her Graves' antibodies were elevated: Lab Results  Component Value Date   TSI 205 (H) 09/16/2018   TSI 630 (H) 09/07/2016   Pt denies: - feeling nodules in neck - hoarseness - dysphagia - choking  Pt does not have a FH of thyroid ds. No FH of thyroid cancer. No h/o radiation tx to head or neck except for RAI treatment. No herbal supplements. No Biotin use anymre. No recent steroids use.   She also has a history of CVA - when in [place] (~52 y/o) - she was told at that time that her thyroid was abnormal. She also has an aneurysm - previously coiled. She has ATIII deficiency, B12 deficiency.  ROS: + See HPI  I reviewed pt's medications, allergies, PMH, social hx, family hx, and changes were documented in the history of present illness. Otherwise, unchanged from my  initial visit note.  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HCC)    Endometriosis determined by laparoscopy 02/28/2016   Headache(784.0)    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   History of cerebral aneurysm repair dx 10-18-2006 via MRI/MRA  5.62mm x 4.81mm saccular   11-16-2006  stent-assisted coiling LICA intracranial superior hypophyseal region   History of CVA (cerebrovascular accident)    2003   History of ovarian cyst    Pelvic pain in female 12/05/2015   Uterine polyp 12/05/2015   Past Surgical History:  Procedure Laterality Date   HYSTEROSCOPY WITH D & C N/A 02/28/2016   Procedure: DILATATION AND CURETTAGE /DIAGNOSTIC HYSTEROSCOPY;  Surgeon: Sherian Rein, MD;   Location: [facility];  Service: Gynecology;  Laterality: N/A;   LAPAROSCOPIC OVARIAN CYSTECTOMY  2001   LAPAROSCOPY N/A 02/28/2016   Procedure: OPERATIVE LAPAROSCOPY; LYSIS OF ADHESIONS;  Surgeon: Sherian Rein, MD;  Location: [facility] [place];  Service: Gynecology;  Laterality: N/A;   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  11-16-2006   dr deveshwar   coiling  (general anesthesia)   Social History   Social History   Marital status: Married    Spouse name: N/A   Number of children: 2: 69 and 70 y/o in 2017   Occupational History   Office Support - Neurosurgeon    Social History Main Topics   Smoking status: Never Smoker   Smokeless tobacco: Never Used   Alcohol use No   Drug use: Tequila, beer - occasionally   Social History Narrative   Immigrated to [place] 1999 from [place]   Current Outpatient Medications on File Prior to Visit  Medication Sig Dispense Refill   ALPRAZolam (XANAX) 0.25 MG tablet Take 1-2 tablets (0.25-0.5 mg total) by mouth 2 (two) times daily as needed for anxiety. (Patient not taking: Reported on 03/15/2023) 60 tablet 1   aspirin 325 MG EC tablet Take 325 mg by mouth daily.     Cholecalciferol (VITAMIN D3) 2000 units TABS Take 2,000 Units by mouth daily.      Cyanocobalamin (VITAMIN B-12) 500 MCG SUBL Place 1 tablet (500 mcg total) under the tongue 1 day or 1 dose. 100 tablet 3   cyclobenzaprine (FLEXERIL) 5 MG tablet Take 1 tablet (5 mg total) by mouth 3 (three) times daily as needed for muscle spasms. 30 tablet 1   escitalopram (LEXAPRO) 10 MG tablet Take 1 tablet (10 mg total) by mouth daily. 90 tablet 1   levothyroxine (SYNTHROID) 125 MCG tablet Take 1 tablet by mouth once daily 90 tablet 0   methylPREDNISolone (MEDROL DOSEPAK) 4 MG TBPK tablet Sig as indicated 21 tablet 1   traMADol (ULTRAM) 50 MG tablet Take 1 tablet (50 mg total) by mouth every 6  (six) hours as needed. 20 tablet 1   triamcinolone cream (KENALOG) 0.5 % Apply 1 Application topically 3 (three) times daily as needed. 30 g 1   Ubrogepant (UBRELVY) 100 MG TABS Take 1 tablet by mouth daily as needed. 16 tablet 2   No current facility-administered medications on file prior to visit.   No Known Allergies Family History  Problem Relation Age of Onset   Leukemia Mother    Cancer Mother 31       leukemia   Lung cancer Father        SMOKER   Cancer Father 61       lung  ca   Heart disease Other    Stroke Other    Alcohol abuse Other    Breast cancer Other    PE: BP 120/62   Pulse 69   Ht 5\' 3"  (1.6 m)   Wt 156 lb 12.8 oz (71.1 kg)   SpO2 97%   BMI 27.78 kg/m  Wt Readings from Last 3 Encounters:  07/27/23 156 lb 12.8 oz (71.1 kg)  03/23/23 155 lb 6.4 oz (70.5 kg)  03/15/23 158 lb (71.7 kg)   Constitutional: normal weight, in NAD Eyes:  EOMI, no exophthalmos ENT: no neck masses, no cervical lymphadenopathy Cardiovascular: RRR, No MRG Respiratory: CTA B Musculoskeletal: no deformities Skin:no rashes Neurological: no tremor with outstretched hands  ASSESSMENT: 1.  Post ablative hypothyroidism  PLAN:  1. Patient with history of Graves' disease, status post RAI ablation in 10/2018, after which she rapidly developed hypothyroidism.  We initially started her on 75 mcg of levothyroxine, but we had to increase the dose afterwards - latest thyroid labs reviewed with pt. >> normal: Lab Results  Component Value Date   TSH 0.91 01/08/2023  - she continues on LT4 125 mcg daily - pt feels good on this dose. - we discussed about taking the thyroid hormone every day, with water, >30 minutes before breakfast, separated by >4 hours from acid reflux medications, calcium, iron, multivitamins. Pt. is taking it correctly. - will check thyroid tests today: TSH and fT4 - If labs are abnormal, she will need to return for repeat TFTs in 1.5 months - OTW, I will see the patient  back in 1 year  Needs refills - for 1 year.  Carlus Pavlov, MD PhD [facility] Endocrinology

## 2023-07-27 NOTE — Patient Instructions (Signed)
Please continue levothyroxine 125 mcg daily  Take the thyroid hormone every day, with water, at least 30 minutes before breakfast, separated by at least 4 hours from: - acid reflux medications - calcium - iron - multivitamins  Please stop at the lab.  Please come back for a follow-up appointment in 1 year.

## 2023-07-28 LAB — T4, FREE: Free T4: 0.82 ng/dL (ref 0.60–1.60)

## 2023-07-28 LAB — TSH: TSH: 3.4 u[IU]/mL (ref 0.35–5.50)

## 2023-07-29 ENCOUNTER — Encounter: Payer: Self-pay | Admitting: Internal Medicine

## 2023-07-29 MED ORDER — LEVOTHYROXINE SODIUM 125 MCG PO TABS: 125.0000 ug | ORAL_TABLET | Freq: Every day | ORAL | 3 refills | Status: DC

## 2023-08-02 LAB — COLOGUARD: COLOGUARD: NEGATIVE

## 2024-03-31 ENCOUNTER — Encounter (HOSPITAL_COMMUNITY): Payer: Self-pay | Admitting: Interventional Radiology

## 2024-07-25 ENCOUNTER — Encounter: Payer: Self-pay | Admitting: Internal Medicine

## 2024-07-25 ENCOUNTER — Other Ambulatory Visit

## 2024-07-25 ENCOUNTER — Ambulatory Visit: Payer: BC Managed Care – PPO | Admitting: Internal Medicine

## 2024-07-25 VITALS — BP 120/60 | HR 76 | Ht 63.0 in | Wt 159.6 lb

## 2024-07-25 DIAGNOSIS — E890 Postprocedural hypothyroidism: Secondary | ICD-10-CM

## 2024-07-25 DIAGNOSIS — T17308A Unspecified foreign body in larynx causing other injury, initial encounter: Secondary | ICD-10-CM | POA: Diagnosis not present

## 2024-07-25 LAB — T4, FREE: Free T4: 1.5 ng/dL (ref 0.8–1.8)

## 2024-07-25 LAB — TSH: TSH: 0.61 m[IU]/L

## 2024-07-25 NOTE — Patient Instructions (Signed)
Please continue levothyroxine 125 mcg daily  Take the thyroid hormone every day, with water, at least 30 minutes before breakfast, separated by at least 4 hours from: - acid reflux medications - calcium - iron - multivitamins  Please stop at the lab.  Please come back for a follow-up appointment in 1 year.

## 2024-07-25 NOTE — Progress Notes (Signed)
 Patient ID: Linda Stewart, female   DOB: 03-12-1971, 53 y.o.   MRN: [id number]   HPI  Linda Stewart is a 53 y.o.-year-old female, returning for f/u for history of Graves ds, now with post ablative hypothyroidism. Last visit 1 year ago.  Interim history: She has no symptoms at today's visit.  She had nec pain prev., but this resolved, now has L elbow pain and weakness. She previously had hot flashes, but they resolved before last visit. She does mention weight gain-3 pounds since last visit. She also had 2 episodes of choking more recently, with 1 with liquids and 1 with food.  Reviewed history: I saw the pt first in 2015, when she presented with thyrotoxicosis, but this was improving and I advised her to return for labs in 2 months, but did not intervene at that time. Pt. was lost for f/u.   She then presented to PCP in 2017 >> still thyrotoxic >> advised to return to see me.  She was started on Propranolol  10 mg 3x a day by PCP.  We were able to decrease this gradually and then stop  In 05/2018, we increased her methimazole  from 5 mg twice a day to 10 mg twice a day.  Subsequent labs were improved.    In 09/2018, I suggested RAI treatment after she returned after long absence with very abnormal TFTs.  Thyroid  uptake and scan (10/14/2018): Was consistent with Graves' disease: Elevated 4 hour (54.1%) and 24 hour (66.6) RAI uptake consistent with hyperthyroidism.  Normal thyroid  scan   RAI treatment (11/04/2018). She felt well after the treatment, without symptoms other than mild fatigue.  She developed post ablative hypothyroidism and was started on levothyroxine  in 12/2018.  We continued to increase the dose afterwards, last dose change was 04/2019.  Pt is on levothyroxine  125 mcg daily, taken: - in am - fasting - ~1.5h from b'fast - no Ca, MVI, PPIs - no Fe  - not on Biotin On B12 and D vitamins.  Reviewed patient's TFTs: Lab Results  Component Value Date   TSH 3.40  07/27/2023   TSH 0.91 01/08/2023   TSH 1.760 06/26/2022   TSH 3.85 12/10/2021   TSH 0.85 06/25/2021   TSH 0.75 11/18/2020   TSH 1.08 04/16/2020   TSH 2.35 09/07/2019   TSH 2.38 07/06/2019   TSH 8.43 (H) 04/20/2019   FREET4 0.82 07/27/2023   FREET4 1.22 01/08/2023   FREET4 1.60 06/26/2022   FREET4 0.99 06/25/2021   FREET4 1.12 09/07/2019   FREET4 1.19 07/06/2019   FREET4 0.80 04/20/2019   FREET4 0.82 03/03/2019   FREET4 0.38 (L) 12/26/2018   FREET4 1.42 09/16/2018    Her Graves' antibodies were elevated: Lab Results  Component Value Date   TSI 205 (H) 09/16/2018   TSI 630 (H) 09/07/2016   Pt denies: - feeling nodules in neck - hoarseness - dysphagia - but had 2 choking episodes - when drinking  Pt does not have a FH of thyroid  ds. No FH of thyroid  cancer. No h/o radiation tx to head or neck except for RAI treatment. No herbal supplements. No Biotin use anymre. No recent steroids use.   She also has a history of CVA - when in [place] (~53 y/o) - she was told at that time that her thyroid  was abnormal. She also has an aneurysm - previously coiled. She has ATIII deficiency, B12 deficiency.  ROS: + See HPI  I reviewed pt's medications, allergies, PMH, social hx, family hx,  and changes were documented in the history of present illness. Otherwise, unchanged from my initial visit note.  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   History of cerebral aneurysm repair dx 10-18-2006 via MRI/MRA  5.46mm x 4.5mm saccular   11-16-2006  stent-assisted coiling LICA intracranial superior hypophyseal region   History of CVA (cerebrovascular accident)    2003   History of ovarian cyst    Pelvic pain in female 12/05/2015   Uterine polyp 12/05/2015   Past Surgical History:  Procedure Laterality Date   HYSTEROSCOPY WITH D & C N/A 02/28/2016   Procedure:  DILATATION AND CURETTAGE /DIAGNOSTIC HYSTEROSCOPY;  Surgeon: Ezzie Buba, MD;  Location: [facility];  Service: Gynecology;  Laterality: N/A;   LAPAROSCOPIC OVARIAN CYSTECTOMY  2001   LAPAROSCOPY N/A 02/28/2016   Procedure: OPERATIVE LAPAROSCOPY; LYSIS OF ADHESIONS;  Surgeon: Ezzie Buba, MD;  Location: [facility] [place];  Service: Gynecology;  Laterality: N/A;   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  11-16-2006   dr deveshwar   coiling  (general anesthesia)   Social History   Social History   Marital status: Married    Spouse name: N/A   Number of children: 2: 48 and 56 y/o in 2017   Occupational History   Office Support - Neurosurgeon    Social History Main Topics   Smoking status: Never Smoker   Smokeless tobacco: Never Used   Alcohol use No   Drug use: Tequila, beer - occasionally   Social History Narrative   [place] from [place]   Current Outpatient Medications on File Prior to Visit  Medication Sig Dispense Refill   ALPRAZolam  (XANAX ) 0.25 MG tablet Take 1-2 tablets (0.25-0.5 mg total) by mouth 2 (two) times daily as needed for anxiety. 60 tablet 1   aspirin 325 MG EC tablet Take 325 mg by mouth daily.     Cholecalciferol (VITAMIN D3) 2000 units TABS Take 2,000 Units by mouth daily.      Cyanocobalamin (VITAMIN B-12) 500 MCG SUBL Place 1 tablet (500 mcg total) under the tongue 1 day or 1 dose. 100 tablet 3   cyclobenzaprine  (FLEXERIL ) 5 MG tablet Take 1 tablet (5 mg total) by mouth 3 (three) times daily as needed for muscle spasms. 30 tablet 1   escitalopram  (LEXAPRO ) 10 MG tablet Take 1 tablet (10 mg total) by mouth daily. 90 tablet 1   levothyroxine  (SYNTHROID ) 125 MCG tablet Take 1 tablet (125 mcg total) by mouth daily. 90 tablet 3   methylPREDNISolone  (MEDROL  DOSEPAK) 4 MG TBPK tablet Sig as indicated 21 tablet 1   traMADol  (ULTRAM ) 50 MG  tablet Take 1 tablet (50 mg total) by mouth every 6 (six) hours as needed. 20 tablet 1   Ubrogepant  (UBRELVY ) 100 MG TABS Take 1 tablet by mouth daily as needed. 16 tablet 2   No current facility-administered medications on file prior to visit.   No Known Allergies Family History  Problem Relation Age of Onset   Leukemia Mother    Cancer Mother 15       leukemia   Lung cancer Father        SMOKER   Cancer Father 71       lung ca   Heart disease Other    Stroke Other  Alcohol abuse Other    Breast cancer Other    PE: BP 120/60   Pulse 76   Ht 5' 3 (1.6 m)   Wt 159 lb 9.6 oz (72.4 kg)   SpO2 97%   BMI 28.27 kg/m  Wt Readings from Last 3 Encounters:  07/25/24 159 lb 9.6 oz (72.4 kg)  07/27/23 156 lb 12.8 oz (71.1 kg)  03/23/23 155 lb 6.4 oz (70.5 kg)   Constitutional: normal weight, in NAD Eyes:  EOMI, no exophthalmos ENT: no neck masses, no cervical lymphadenopathy Cardiovascular: RRR, No MRG Respiratory: CTA B Musculoskeletal: no deformities Skin:no rashes Neurological: no tremor with outstretched hands  ASSESSMENT: 1.  Post ablative hypothyroidism  2.  Choking  PLAN:  1. Patient with history of Graves' disease, status post RAI ablation in 10/2018 after which she rapidly developed hypothyroidism.  We initially started her on 75 mcg of LT4, but we had to increase the dose afterwards. - latest thyroid  labs reviewed with pt. >> normal: Lab Results  Component Value Date   TSH 3.40 07/27/2023  - she continues on LT4 125 mcg daily - pt feels good on this dose, without complaints today - we discussed about taking the thyroid  hormone every day, with water, >30 minutes before breakfast, separated by >4 hours from acid reflux medications, calcium, iron, multivitamins. Pt. is taking it correctly. -We did discuss that she can take 2 tablets the next day if she misses one - will check thyroid  tests today: TSH and fT4 - If labs are abnormal, she will need to return for  repeat TFTs in 1.5 months  2.  Choking - She had 2 episodes of choking, 1 with liquids and 1 with solid foods.  She felt that she breathed in about with eating the first time, but not necessarily the second time. - No reflux symptoms - I do not feel masses at the patient of her neck.  I did explain that after RAI treatment, her thyroid  has slightly shrunk. - However, I advised her to continue to pay attention to this and if the choking persists, we may need to check an neck ultrasound.  Needs refills - for 1 year.  Orders Placed This Encounter  Procedures   TSH   T4, free   Lela Fendt, MD PhD [facility] Endocrinology

## 2024-07-26 ENCOUNTER — Ambulatory Visit: Payer: Self-pay | Admitting: Internal Medicine

## 2024-07-26 MED ORDER — LEVOTHYROXINE SODIUM 125 MCG PO TABS: 125.0000 ug | ORAL_TABLET | Freq: Every day | ORAL | 3 refills | Status: AC

## 2024-07-26 NOTE — Addendum Note (Signed)
 Addended by: TRIXIE FILE on: 07/26/2024 09:29 AM   Modules accepted: Orders

## 2025-07-26 ENCOUNTER — Ambulatory Visit: Admitting: Internal Medicine
